# Patient Record
Sex: Female | Born: 1940 | Race: White | Hispanic: No | Smoking: Former smoker
Health system: Southern US, Community
[De-identification: ages and names within clinical notes are randomized; demographics above are authoritative.]

## PROBLEM LIST (undated history)

## (undated) DIAGNOSIS — T4145XA Adverse effect of unspecified anesthetic, initial encounter: Secondary | ICD-10-CM

## (undated) DIAGNOSIS — I10 Essential (primary) hypertension: Secondary | ICD-10-CM

## (undated) DIAGNOSIS — Z9889 Other specified postprocedural states: Secondary | ICD-10-CM

## (undated) DIAGNOSIS — G4700 Insomnia, unspecified: Secondary | ICD-10-CM

## (undated) DIAGNOSIS — F32A Depression, unspecified: Secondary | ICD-10-CM

## (undated) DIAGNOSIS — E042 Nontoxic multinodular goiter: Secondary | ICD-10-CM

## (undated) DIAGNOSIS — IMO0001 Reserved for inherently not codable concepts without codable children: Secondary | ICD-10-CM

## (undated) DIAGNOSIS — M797 Fibromyalgia: Secondary | ICD-10-CM

## (undated) DIAGNOSIS — T8859XA Other complications of anesthesia, initial encounter: Secondary | ICD-10-CM

## (undated) DIAGNOSIS — K219 Gastro-esophageal reflux disease without esophagitis: Secondary | ICD-10-CM

## (undated) DIAGNOSIS — G2581 Restless legs syndrome: Secondary | ICD-10-CM

## (undated) DIAGNOSIS — E785 Hyperlipidemia, unspecified: Secondary | ICD-10-CM

## (undated) DIAGNOSIS — R112 Nausea with vomiting, unspecified: Secondary | ICD-10-CM

## (undated) DIAGNOSIS — D249 Benign neoplasm of unspecified breast: Secondary | ICD-10-CM

## (undated) DIAGNOSIS — F329 Major depressive disorder, single episode, unspecified: Secondary | ICD-10-CM

## (undated) DIAGNOSIS — M1990 Unspecified osteoarthritis, unspecified site: Secondary | ICD-10-CM

## (undated) HISTORY — DX: Nontoxic multinodular goiter: E04.2

## (undated) HISTORY — DX: Fibromyalgia: M79.7

## (undated) HISTORY — DX: Unspecified osteoarthritis, unspecified site: M19.90

## (undated) HISTORY — DX: Reserved for inherently not codable concepts without codable children: IMO0001

## (undated) HISTORY — PX: MOUTH SURGERY: SHX715

## (undated) HISTORY — DX: Insomnia, unspecified: G47.00

## (undated) HISTORY — PX: ABDOMINAL HYSTERECTOMY: SHX81

## (undated) HISTORY — DX: Essential (primary) hypertension: I10

## (undated) HISTORY — DX: Hyperlipidemia, unspecified: E78.5

## (undated) HISTORY — DX: Benign neoplasm of unspecified breast: D24.9

## (undated) HISTORY — PX: COLONOSCOPY: SHX174

## (undated) HISTORY — DX: Restless legs syndrome: G25.81

## (undated) HISTORY — PX: BREAST SURGERY: SHX581

## (undated) HISTORY — PX: OOPHORECTOMY: SHX86

---

## 1970-08-15 HISTORY — PX: DILATION AND CURETTAGE OF UTERUS: SHX78

## 1998-03-06 ENCOUNTER — Other Ambulatory Visit: Admission: RE | Admit: 1998-03-06 | Discharge: 1998-03-06 | Payer: Self-pay | Admitting: Obstetrics and Gynecology

## 1999-03-08 ENCOUNTER — Other Ambulatory Visit: Admission: RE | Admit: 1999-03-08 | Discharge: 1999-03-08 | Payer: Self-pay | Admitting: Obstetrics and Gynecology

## 1999-12-06 ENCOUNTER — Other Ambulatory Visit: Admission: RE | Admit: 1999-12-06 | Discharge: 1999-12-06 | Payer: Self-pay | Admitting: Obstetrics and Gynecology

## 2000-05-15 ENCOUNTER — Other Ambulatory Visit: Admission: RE | Admit: 2000-05-15 | Discharge: 2000-05-15 | Payer: Self-pay | Admitting: Obstetrics and Gynecology

## 2001-06-14 ENCOUNTER — Other Ambulatory Visit: Admission: RE | Admit: 2001-06-14 | Discharge: 2001-06-14 | Payer: Self-pay | Admitting: *Deleted

## 2001-11-22 ENCOUNTER — Other Ambulatory Visit: Admission: RE | Admit: 2001-11-22 | Discharge: 2001-11-22 | Payer: Self-pay | Admitting: *Deleted

## 2002-06-26 ENCOUNTER — Other Ambulatory Visit: Admission: RE | Admit: 2002-06-26 | Discharge: 2002-06-26 | Payer: Self-pay | Admitting: *Deleted

## 2003-07-22 ENCOUNTER — Other Ambulatory Visit: Admission: RE | Admit: 2003-07-22 | Discharge: 2003-07-22 | Payer: Self-pay | Admitting: *Deleted

## 2004-01-30 ENCOUNTER — Encounter: Admission: RE | Admit: 2004-01-30 | Discharge: 2004-01-30 | Payer: Self-pay | Admitting: Rheumatology

## 2004-06-24 ENCOUNTER — Ambulatory Visit: Payer: Self-pay | Admitting: Internal Medicine

## 2004-09-06 ENCOUNTER — Ambulatory Visit: Payer: Self-pay | Admitting: Internal Medicine

## 2004-10-20 ENCOUNTER — Ambulatory Visit: Payer: Self-pay | Admitting: Internal Medicine

## 2004-11-15 ENCOUNTER — Ambulatory Visit: Payer: Self-pay | Admitting: Internal Medicine

## 2004-11-18 ENCOUNTER — Ambulatory Visit: Payer: Self-pay

## 2004-12-20 ENCOUNTER — Ambulatory Visit (HOSPITAL_COMMUNITY): Admission: RE | Admit: 2004-12-20 | Discharge: 2004-12-20 | Payer: Self-pay | Admitting: Internal Medicine

## 2005-06-28 ENCOUNTER — Ambulatory Visit: Payer: Self-pay | Admitting: Internal Medicine

## 2005-08-17 ENCOUNTER — Ambulatory Visit: Payer: Self-pay | Admitting: Internal Medicine

## 2005-08-25 ENCOUNTER — Ambulatory Visit: Payer: Self-pay | Admitting: Internal Medicine

## 2006-02-08 ENCOUNTER — Other Ambulatory Visit: Admission: RE | Admit: 2006-02-08 | Discharge: 2006-02-08 | Payer: Self-pay | Admitting: Obstetrics & Gynecology

## 2006-03-15 ENCOUNTER — Encounter (INDEPENDENT_AMBULATORY_CARE_PROVIDER_SITE_OTHER): Payer: Self-pay | Admitting: *Deleted

## 2006-03-15 LAB — CONVERTED CEMR LAB

## 2006-07-11 ENCOUNTER — Ambulatory Visit: Payer: Self-pay | Admitting: Internal Medicine

## 2006-08-16 ENCOUNTER — Ambulatory Visit: Payer: Self-pay | Admitting: Internal Medicine

## 2006-10-17 ENCOUNTER — Ambulatory Visit: Payer: Self-pay | Admitting: Internal Medicine

## 2006-10-17 LAB — CONVERTED CEMR LAB
Cholesterol: 209 mg/dL (ref 0–200)
Direct LDL: 133.3 mg/dL
HDL: 49.4 mg/dL (ref >39.0)
VLDL: 30 mg/dL (ref 0–40)

## 2006-10-19 ENCOUNTER — Ambulatory Visit: Payer: Self-pay | Admitting: Internal Medicine

## 2007-05-07 ENCOUNTER — Encounter: Payer: Self-pay | Admitting: *Deleted

## 2007-05-07 DIAGNOSIS — G2581 Restless legs syndrome: Secondary | ICD-10-CM

## 2007-05-07 DIAGNOSIS — IMO0001 Reserved for inherently not codable concepts without codable children: Secondary | ICD-10-CM

## 2007-05-07 DIAGNOSIS — M1990 Unspecified osteoarthritis, unspecified site: Secondary | ICD-10-CM | POA: Insufficient documentation

## 2007-05-07 HISTORY — DX: Reserved for inherently not codable concepts without codable children: IMO0001

## 2007-05-07 HISTORY — DX: Restless legs syndrome: G25.81

## 2007-05-07 HISTORY — DX: Unspecified osteoarthritis, unspecified site: M19.90

## 2007-06-01 ENCOUNTER — Other Ambulatory Visit: Admission: RE | Admit: 2007-06-01 | Discharge: 2007-06-01 | Payer: Self-pay | Admitting: Obstetrics & Gynecology

## 2007-06-25 ENCOUNTER — Telehealth: Payer: Self-pay | Admitting: Internal Medicine

## 2007-08-02 ENCOUNTER — Ambulatory Visit: Payer: Self-pay | Admitting: Internal Medicine

## 2007-12-13 ENCOUNTER — Ambulatory Visit: Payer: Self-pay | Admitting: Internal Medicine

## 2007-12-13 LAB — CONVERTED CEMR LAB
Basophils Absolute: 0.1 10*3/uL (ref 0.0–0.1)
Eosinophils Absolute: 0.3 10*3/uL (ref 0.0–0.7)
Eosinophils Relative: 3.6 % (ref 0.0–5.0)
HCT: 42 % (ref 36.0–46.0)
Hemoglobin: 14.2 g/dL (ref 12.0–15.0)
Neutro Abs: 4.3 10*3/uL (ref 1.4–7.7)
WBC: 8.4 10*3/uL (ref 4.5–10.5)

## 2008-01-25 ENCOUNTER — Telehealth: Payer: Self-pay | Admitting: Internal Medicine

## 2008-04-25 ENCOUNTER — Telehealth: Payer: Self-pay | Admitting: Internal Medicine

## 2008-05-21 ENCOUNTER — Encounter: Payer: Self-pay | Admitting: Internal Medicine

## 2008-06-24 ENCOUNTER — Ambulatory Visit: Payer: Self-pay | Admitting: Internal Medicine

## 2008-07-15 ENCOUNTER — Telehealth: Payer: Self-pay | Admitting: Internal Medicine

## 2008-07-17 ENCOUNTER — Ambulatory Visit: Payer: Self-pay | Admitting: Internal Medicine

## 2008-07-18 ENCOUNTER — Encounter: Payer: Self-pay | Admitting: Internal Medicine

## 2008-07-23 ENCOUNTER — Telehealth (INDEPENDENT_AMBULATORY_CARE_PROVIDER_SITE_OTHER): Payer: Self-pay | Admitting: *Deleted

## 2008-07-31 ENCOUNTER — Ambulatory Visit: Payer: Self-pay | Admitting: Internal Medicine

## 2008-07-31 DIAGNOSIS — E785 Hyperlipidemia, unspecified: Secondary | ICD-10-CM

## 2008-07-31 DIAGNOSIS — E1169 Type 2 diabetes mellitus with other specified complication: Secondary | ICD-10-CM

## 2008-07-31 HISTORY — DX: Hyperlipidemia, unspecified: E78.5

## 2008-08-01 DIAGNOSIS — I10 Essential (primary) hypertension: Secondary | ICD-10-CM

## 2008-08-01 DIAGNOSIS — E1165 Type 2 diabetes mellitus with hyperglycemia: Secondary | ICD-10-CM | POA: Insufficient documentation

## 2008-08-01 DIAGNOSIS — IMO0002 Reserved for concepts with insufficient information to code with codable children: Secondary | ICD-10-CM | POA: Insufficient documentation

## 2008-08-01 HISTORY — DX: Essential (primary) hypertension: I10

## 2008-08-28 ENCOUNTER — Ambulatory Visit: Payer: Self-pay | Admitting: Internal Medicine

## 2008-08-28 LAB — CONVERTED CEMR LAB
AST: 21 units/L (ref 0–37)
Albumin: 3.9 g/dL (ref 3.5–5.2)
BUN: 16 mg/dL (ref 6–23)
Bilirubin, Direct: 0.1 mg/dL (ref 0.0–0.3)
CO2: 31 meq/L (ref 19–32)
Calcium: 9.3 mg/dL (ref 8.4–10.5)
Chloride: 102 meq/L (ref 96–112)
Cholesterol: 138 mg/dL (ref 0–200)
Creatinine, Ser: 0.6 mg/dL (ref 0.4–1.2)
Creatinine,U: 124.2 mg/dL
GFR calc Af Amer: 129 mL/min
Microalb Creat Ratio: 161 mg/g — ABNORMAL HIGH (ref 0.0–30.0)
Microalb, Ur: 20 mg/dL — ABNORMAL HIGH (ref 0.0–1.9)
Potassium: 4.2 meq/L (ref 3.5–5.1)
Sodium: 139 meq/L (ref 135–145)
Total Protein: 7 g/dL (ref 6.0–8.3)
VLDL: 19 mg/dL (ref 0–40)

## 2008-09-02 ENCOUNTER — Ambulatory Visit: Payer: Self-pay | Admitting: Internal Medicine

## 2008-09-02 LAB — HM DIABETES FOOT EXAM

## 2008-10-03 ENCOUNTER — Telehealth: Payer: Self-pay | Admitting: Internal Medicine

## 2008-10-14 ENCOUNTER — Ambulatory Visit: Payer: Self-pay | Admitting: Internal Medicine

## 2008-10-14 LAB — CONVERTED CEMR LAB
ALT: 23 units/L (ref 0–35)
AST: 18 units/L (ref 0–37)
Alkaline Phosphatase: 69 units/L (ref 39–117)
Bilirubin Urine: NEGATIVE
Calcium: 9.3 mg/dL (ref 8.4–10.5)
Chloride: 101 meq/L (ref 96–112)
Cholesterol: 134 mg/dL (ref 0–200)
Crystals: NEGATIVE
GFR calc Af Amer: 129 mL/min
Glucose, Bld: 164 mg/dL — ABNORMAL HIGH (ref 70–99)
Hemoglobin, Urine: NEGATIVE
Ketones, ur: NEGATIVE mg/dL
Potassium: 4.3 meq/L (ref 3.5–5.1)
Sodium: 139 meq/L (ref 135–145)
Specific Gravity, Urine: 1.02 (ref 1.000–1.035)
Total CHOL/HDL Ratio: 3.1
Triglycerides: 66 mg/dL (ref 0–149)
Urine Glucose: NEGATIVE mg/dL
Urobilinogen, UA: 0.2 (ref 0.0–1.0)

## 2008-10-16 ENCOUNTER — Ambulatory Visit: Payer: Self-pay | Admitting: Internal Medicine

## 2008-11-25 ENCOUNTER — Ambulatory Visit: Payer: Self-pay | Admitting: Internal Medicine

## 2008-11-25 ENCOUNTER — Telehealth: Payer: Self-pay | Admitting: Internal Medicine

## 2008-11-25 LAB — CONVERTED CEMR LAB: Hgb A1c MFr Bld: 7.1 % — ABNORMAL HIGH (ref 4.6–6.5)

## 2008-11-27 ENCOUNTER — Ambulatory Visit: Payer: Self-pay | Admitting: Internal Medicine

## 2008-11-28 ENCOUNTER — Telehealth (INDEPENDENT_AMBULATORY_CARE_PROVIDER_SITE_OTHER): Payer: Self-pay | Admitting: *Deleted

## 2009-01-26 ENCOUNTER — Telehealth: Payer: Self-pay | Admitting: Internal Medicine

## 2009-01-27 ENCOUNTER — Ambulatory Visit: Payer: Self-pay | Admitting: Internal Medicine

## 2009-02-24 ENCOUNTER — Ambulatory Visit: Payer: Self-pay | Admitting: Internal Medicine

## 2009-02-24 DIAGNOSIS — T887XXA Unspecified adverse effect of drug or medicament, initial encounter: Secondary | ICD-10-CM

## 2009-02-24 LAB — CONVERTED CEMR LAB
AST: 22 units/L (ref 0–37)
Alkaline Phosphatase: 70 units/L (ref 39–117)
Bilirubin, Direct: 0.1 mg/dL (ref 0.0–0.3)
Cholesterol: 141 mg/dL (ref 0–200)
Eosinophils Relative: 5.8 % — ABNORMAL HIGH (ref 0.0–5.0)
HCT: 37.6 % (ref 36.0–46.0)
Hgb A1c MFr Bld: 7 % — ABNORMAL HIGH (ref 4.6–6.5)
LDL Cholesterol: 79 mg/dL (ref 0–99)
Lymphocytes Relative: 29.3 % (ref 12.0–46.0)
MCV: 89.7 fL (ref 78.0–100.0)
Monocytes Relative: 6.7 % (ref 3.0–12.0)
Neutrophils Relative %: 57.5 % (ref 43.0–77.0)
Platelets: 245 10*3/uL (ref 150.0–400.0)
RDW: 12.5 % (ref 11.5–14.6)
Total Protein: 6.7 g/dL (ref 6.0–8.3)
VLDL: 13 mg/dL (ref 0.0–40.0)

## 2009-02-26 ENCOUNTER — Ambulatory Visit: Payer: Self-pay | Admitting: Internal Medicine

## 2009-05-22 ENCOUNTER — Encounter: Payer: Self-pay | Admitting: Internal Medicine

## 2009-06-23 ENCOUNTER — Ambulatory Visit: Payer: Self-pay | Admitting: Internal Medicine

## 2009-06-23 LAB — CONVERTED CEMR LAB
Bilirubin, Direct: 0.1 mg/dL (ref 0.0–0.3)
Cholesterol: 153 mg/dL (ref 0–200)
HDL: 43 mg/dL (ref >39.00)
Hgb A1c MFr Bld: 7.5 % — ABNORMAL HIGH (ref 4.6–6.5)
LDL Cholesterol: 90 mg/dL (ref 0–99)
Total CHOL/HDL Ratio: 4
VLDL: 20.4 mg/dL (ref 0.0–40.0)

## 2009-06-26 ENCOUNTER — Ambulatory Visit: Payer: Self-pay | Admitting: Internal Medicine

## 2009-06-29 DIAGNOSIS — G4700 Insomnia, unspecified: Secondary | ICD-10-CM

## 2009-06-29 HISTORY — DX: Insomnia, unspecified: G47.00

## 2009-08-24 ENCOUNTER — Telehealth: Payer: Self-pay | Admitting: Internal Medicine

## 2009-09-22 ENCOUNTER — Ambulatory Visit: Payer: Self-pay | Admitting: Internal Medicine

## 2009-09-22 LAB — CONVERTED CEMR LAB
Alkaline Phosphatase: 76 units/L (ref 39–117)
Bilirubin, Direct: 0.1 mg/dL (ref 0.0–0.3)
Cholesterol: 144 mg/dL (ref 0–200)
HDL: 49.3 mg/dL (ref >39.00)
Hgb A1c MFr Bld: 7.4 % — ABNORMAL HIGH (ref 4.6–6.5)
LDL Cholesterol: 64 mg/dL (ref 0–99)
Total CHOL/HDL Ratio: 3
VLDL: 30.4 mg/dL (ref 0.0–40.0)

## 2009-09-25 ENCOUNTER — Ambulatory Visit: Payer: Self-pay | Admitting: Internal Medicine

## 2009-09-25 DIAGNOSIS — R197 Diarrhea, unspecified: Secondary | ICD-10-CM

## 2009-10-09 ENCOUNTER — Telehealth: Payer: Self-pay | Admitting: Internal Medicine

## 2009-10-20 ENCOUNTER — Telehealth: Payer: Self-pay | Admitting: Internal Medicine

## 2009-10-26 ENCOUNTER — Telehealth: Payer: Self-pay | Admitting: Internal Medicine

## 2009-10-28 ENCOUNTER — Ambulatory Visit: Payer: Self-pay | Admitting: Internal Medicine

## 2009-10-28 DIAGNOSIS — B373 Candidiasis of vulva and vagina: Secondary | ICD-10-CM | POA: Insufficient documentation

## 2009-10-30 ENCOUNTER — Encounter: Payer: Self-pay | Admitting: Internal Medicine

## 2009-10-30 ENCOUNTER — Ambulatory Visit: Payer: Self-pay | Admitting: Internal Medicine

## 2009-10-30 DIAGNOSIS — R300 Dysuria: Secondary | ICD-10-CM | POA: Insufficient documentation

## 2009-10-30 LAB — CONVERTED CEMR LAB
Bilirubin Urine: NEGATIVE
Hemoglobin, Urine: NEGATIVE
Ketones, ur: NEGATIVE mg/dL
Specific Gravity, Urine: >=1.03 (ref 1.000–1.030)
Total Protein, Urine: 30 mg/dL
pH: 6 (ref 5.0–8.0)

## 2009-11-30 ENCOUNTER — Telehealth: Payer: Self-pay | Admitting: Internal Medicine

## 2009-12-29 ENCOUNTER — Ambulatory Visit: Payer: Self-pay | Admitting: Internal Medicine

## 2010-01-27 ENCOUNTER — Telehealth: Payer: Self-pay | Admitting: Internal Medicine

## 2010-02-11 ENCOUNTER — Encounter: Payer: Self-pay | Admitting: Internal Medicine

## 2010-02-24 ENCOUNTER — Telehealth: Payer: Self-pay | Admitting: Internal Medicine

## 2010-03-03 ENCOUNTER — Encounter (INDEPENDENT_AMBULATORY_CARE_PROVIDER_SITE_OTHER): Payer: Self-pay | Admitting: *Deleted

## 2010-03-03 ENCOUNTER — Ambulatory Visit: Payer: Self-pay | Admitting: Internal Medicine

## 2010-03-12 ENCOUNTER — Encounter (INDEPENDENT_AMBULATORY_CARE_PROVIDER_SITE_OTHER): Payer: Self-pay | Admitting: *Deleted

## 2010-03-16 ENCOUNTER — Encounter (INDEPENDENT_AMBULATORY_CARE_PROVIDER_SITE_OTHER): Payer: Self-pay | Admitting: *Deleted

## 2010-03-16 ENCOUNTER — Ambulatory Visit: Payer: Self-pay | Admitting: Internal Medicine

## 2010-03-23 ENCOUNTER — Telehealth: Payer: Self-pay | Admitting: Internal Medicine

## 2010-03-30 ENCOUNTER — Ambulatory Visit: Payer: Self-pay | Admitting: Internal Medicine

## 2010-04-01 ENCOUNTER — Encounter: Payer: Self-pay | Admitting: Internal Medicine

## 2010-04-29 ENCOUNTER — Telehealth: Payer: Self-pay | Admitting: Internal Medicine

## 2010-05-06 ENCOUNTER — Ambulatory Visit: Payer: Self-pay | Admitting: Internal Medicine

## 2010-05-19 ENCOUNTER — Telehealth: Payer: Self-pay | Admitting: Internal Medicine

## 2010-05-20 ENCOUNTER — Telehealth: Payer: Self-pay | Admitting: Internal Medicine

## 2010-05-26 ENCOUNTER — Encounter: Payer: Self-pay | Admitting: Internal Medicine

## 2010-06-17 ENCOUNTER — Encounter: Payer: Self-pay | Admitting: Internal Medicine

## 2010-06-23 ENCOUNTER — Telehealth: Payer: Self-pay | Admitting: Internal Medicine

## 2010-07-01 ENCOUNTER — Ambulatory Visit: Payer: Self-pay | Admitting: Endocrinology

## 2010-07-01 DIAGNOSIS — E042 Nontoxic multinodular goiter: Secondary | ICD-10-CM | POA: Insufficient documentation

## 2010-07-01 HISTORY — DX: Nontoxic multinodular goiter: E04.2

## 2010-07-01 LAB — CONVERTED CEMR LAB
Hgb A1c MFr Bld: 7.5 % — ABNORMAL HIGH (ref 4.6–6.5)
TSH: 1.62 microintl units/mL (ref 0.35–5.50)

## 2010-07-29 ENCOUNTER — Ambulatory Visit: Payer: Self-pay | Admitting: Endocrinology

## 2010-07-29 ENCOUNTER — Encounter: Payer: Self-pay | Admitting: Endocrinology

## 2010-07-29 LAB — CONVERTED CEMR LAB: Fructosamine: 225 micromoles/L (ref <285)

## 2010-09-14 NOTE — Progress Notes (Signed)
Summary: Metformin  Phone Note Call from Patient Call back at Work Phone [phone]   Summary of Call: Pt stopped metformin as directed and feels much better. Nausea & diarrhea have resolved. She wants to know what the plan is now for diabetes management.  Initial call taken by: Lamar Sprinkles, CMA,  October 09, 2009 9:48 AM  Follow-up for Phone Call        cahgne to actos 30mg  once daily, #30, refill 12 eScribed to drugstore. Follow-up by: Jacques Navy MD,  October 09, 2009 2:33 PM  Additional Follow-up for Phone Call Additional follow up Details #1::        Pt informed  Additional Follow-up by: Lamar Sprinkles, CMA,  October 09, 2009 6:24 PM    New/Updated Medications: ACTOS 30 MG TABS (PIOGLITAZONE HCL) 1 by mouth once daily Prescriptions: ACTOS 30 MG TABS (PIOGLITAZONE HCL) 1 by mouth once daily  #30 x 12   Entered and Authorized by:   Jacques Navy MD   Signed by:   Jacques Navy MD on 10/09/2009   Method used:   [facility]  [address] [phone][facility] ([facility]ail[facility]       [address]       [place], [place]  [address]       Ph: [phone] or [phone]       Fax: [phone]   RxID:   [id number]

## 2010-09-14 NOTE — Letter (Signed)
Summary: Previsit letter  [facility]  [address] [place], [place] [address]   Phone: [phone]  Fax: [phone]       03/03/2010 MRN: [id number]  Carrie White [address] [place], [place]  [address]  Dear Ms. [facility],  Welcome to the Gastroenterology Division at [facility].    You are scheduled to see a nurse for your pre-procedure visit on 03-16-10 at 10am on the 3rd floor at [facility], [address]  We ask that you try to arrive at our office 15 minutes prior to your appointment time to allow for check-in.  Your nurse visit will consist of discussing your medical and surgical history, your immediate family medical history, and your medications.    Please bring a complete list of all your medications or, if you prefer, bring the medication bottles and we will list them.  We will need to be aware of both prescribed and over the counter drugs.  We will need to know exact dosage information as well.  If you are on blood thinners (Coumadin, Plavix, Aggrenox, Ticlid, etc.) please call our office today/prior to your appointment, as we need to consult with your physician about holding your medication.   Please be prepared to read and sign documents such as consent forms, a financial agreement, and acknowledgement forms.  If necessary, and with your consent, a friend or relative is welcome to sit-in on the nurse visit with you.  Please bring your insurance card so that we may make a copy of it.  If your insurance requires a referral to see a specialist, please bring your referral form from your primary care physician.  No co-pay is required for this nurse visit.     If you cannot keep your appointment, please call [phone] to cancel or reschedule prior to your appointment date.  This allows [place] the opportunity to schedule an appointment for another patient in need of care.    Thank you for choosing [facility] for your medical needs.   We appreciate the opportunity to care for you.  Please visit [place] at our website  to learn more about our practice.                     Sincerely.                                                                                                                   The Gastroenterology Division

## 2010-09-14 NOTE — Progress Notes (Signed)
Summary: DIABETES  Phone Note Call from Patient Call back at Work Phone [phone] Call back at [phone]   Summary of Call: Patient is requesting a call back. Has questions about her diabetes.  Initial call taken by: Lamar Sprinkles, CMA,  April 29, 2010 11:18 AM  Follow-up for Phone Call        Pt started new meds for diabetes. She is concerned that this has not caused her cbg's to become any lower. Pt will fax readings to my attention.  Follow-up by: Lamar Sprinkles, CMA,  April 29, 2010 4:43 PM  Additional Follow-up for Phone Call Additional follow up Details #1::        Results avail for MD to review..............Marland KitchenLamar Sprinkles, CMA  April 29, 2010 5:02 PM     Additional Follow-up for Phone Call Additional follow up Details #2::    tried to call at [employer] is busy. She needs an ov to discuss modifications in her diabetes regimen to get better control.  Follow-up by: Jacques Navy MD,  May 05, 2010 1:38 PM  Additional Follow-up for Phone Call Additional follow up Details #3:: Details for Additional Follow-up Action Taken: Pt informed, transferred to scheduler for office visit. Additional Follow-up by: Lamar Sprinkles, CMA,  May 05, 2010 4:37 PM

## 2010-09-14 NOTE — Progress Notes (Signed)
Summary: diabetes  Phone Note Other Incoming   Summary of Call: Pt called stating that the increase in her actos (from 30 to 45mg ) has not even touched her CBGs. She states that her blood sugars are running in the high 300's. Should she come in for an appt. and should it be with Dr Everardo All Please advise Initial call taken by: Ami Bullins CMA,  October 26, 2009 10:45 AM  Follow-up for Phone Call        I can start metformin or gliimeparide or januvia as a second med, but I would be reluctant to do this since no kidney function labs have been done here since early 2010;   please check BMET:  250.02, so that a second med for DM can be considered Follow-up by: Corwin Levins MD,  October 26, 2009 12:57 PM  Additional Follow-up for Phone Call Additional follow up Details #1::        spoke with pt's husband who advised me to call her at work. I tried her work number and it was busy, will try back shortly. Additional Follow-up by: Ami Bullins CMA,  October 26, 2009 1:58 PM    Additional Follow-up for Phone Call Additional follow up Details #2::    Patient left message on triage for me to call her at work # [phone]. Phone system will not let me call to this number. Will try again later. Lucious Groves,  October 27, 2009 11:39 AM  Patient called triage today, requesting we call her office, this number is not reachable. I tried again x3 and still receive busy signal.  Patient spouse notified, and he requests that we call her cell # [phone]. I spoke with patient and her sugar was 409, but has gone down to 390. Patient aware that she needs office visit today with labs and transferred to schedule.Lucious Groves  October 28, 2009 9:37 AM **Seeing Felicity Coyer this afternoon.  Additional Follow-up for Phone Call Additional follow up Details #3:: Details for Additional Follow-up Action Taken: k Additional Follow-up by: Jacques Navy MD,  October 28, 2009 10:17 AM

## 2010-09-14 NOTE — Progress Notes (Signed)
Summary: Colon Prep concerns  Phone Note Call from Patient   Summary of Call: Patient is concerned about colon prep and her diabetes. She was told to drink, "everything w/sugar in it".  Would you like to advise? Or refer to GI RN's for further concerns?  Initial call taken by: Lamar Sprinkles, CMA,  March 23, 2010 11:01 AM  Follow-up for Phone Call        refer to GI nursing who handles bowel prep instructions, including for diabetic patients. Thanks Follow-up by: Jacques Navy MD,  March 23, 2010 1:09 PM  Additional Follow-up for Phone Call Additional follow up Details #1::        Spoke with pt's husband and reviewed instructions.  Pt is to take her oral diabetic medications the day before as prescribed and to hold them the day of her procedure before coming into office.  Pt is to drink regular drinks and jello, not sugar free or diet due to taking her meds and not taking in food; in order to keep her blood sugar up.  Understanding voiced and no further questions at this time Additional Follow-up by: Karl Bales RN,  March 23, 2010 2:00 PM

## 2010-09-14 NOTE — Letter (Signed)
Summary: Cbg log/Patient  Cbg log/Patient   Imported By: Sherian Rein 05/25/2010 15:09:27  _____________________________________________________________________  External Attachment:    Type:   Image     Comment:   External Document

## 2010-09-14 NOTE — Progress Notes (Signed)
Summary: CBGs  Phone Note Call from Patient Call back at Work Phone [phone] Call back at [phone]   Summary of Call: Patient is requesting a call regarding cbg log she faxed to MD tuesday. Dr Debby Bud, have you seen this? She is concerned b/c the cbg's keep going up and down.  Initial call taken by: Lamar Sprinkles, CMA,  May 20, 2010 11:09 AM  Follow-up for Phone Call        called patinet 10/6 @ 1900hrs. She is concerned about variable CBGs.She is taking victoza 1.2mg  daily. Discussed use and variability. Advised that she can pick up another sample cartridge and needles. If she is not satisfied with victoza she will schedule an appointment to discuss other treatment options.  Follow-up by: Jacques Navy MD,  May 21, 2010 4:03 AM

## 2010-09-14 NOTE — Letter (Signed)
Summary: [facility] Instructions  [facility]  [address] [place], [place] [address]   Phone: [phone]  Fax: [phone]       Carrie White    12-Mar-1941    MRN: [id number]       Procedure Day Dorna Bloom:  Jake Shark  03/30/10     Arrival Time:  7:30AM     Procedure Time:  8:30AM     Location of Procedure:                    _ X_  [facility] (4th Floor)    PREPARATION FOR COLONOSCOPY WITH MIRALAX  Starting 5 days prior to your procedure 03/25/10 do not eat nuts, seeds, popcorn, corn, beans, peas,  salads, or any raw vegetables.  Do not take any fiber supplements (e.g. Metamucil, Citrucel, and Benefiber). ____________________________________________________________________________________________________   THE DAY BEFORE YOUR PROCEDURE         DATE: 03/29/10  DAY: MONDAY  1   Drink clear liquids the entire day-NO SOLID FOOD  2   Do not drink anything colored red or purple.  Avoid juices with pulp.  No orange juice.  3   Drink at least 64 oz. (8 glasses) of fluid/clear liquids during the day to prevent dehydration and help the prep work efficiently.  CLEAR LIQUIDS INCLUDE: Water Jello Ice Popsicles Tea (sugar ok, no milk/cream) Powdered fruit flavored drinks Coffee (sugar ok, no milk/cream) Gatorade Juice: apple, white grape, white cranberry  Lemonade Clear bullion, consomm, broth Carbonated beverages (any kind) Strained chicken noodle soup Hard Candy  4   Mix the entire bottle of Miralax with 64 oz. of Gatorade/Powerade in the morning and put in the refrigerator to chill.  5   At 3:00 pm take 2 Dulcolax/Bisacodyl tablets.  6   At 4:30 pm take one Reglan/Metoclopramide tablet.  7  Starting at 5:00 pm drink one 8 oz glass of the Miralax mixture every 15-20 minutes until you have finished drinking the entire 64 oz.  You should finish drinking prep around 7:30 or 8:00 pm.  8   If you are nauseated, you may take the 2nd Reglan/Metoclopramide  tablet at 6:30 pm.        9    At 8:00 pm take 2 more DULCOLAX/Bisacodyl tablets.     THE DAY OF YOUR PROCEDURE      DATE:  03/30/10  DAY: Jake Shark  You may drink clear liquids until 6:30AM  (2 HOURS BEFORE PROCEDURE).   MEDICATION INSTRUCTIONS  Unless otherwise instructed, you should take regular prescription medications with a small sip of water as early as possible the morning of your procedure.  Diabetic patients - see separate instructions.         OTHER INSTRUCTIONS  You will need a responsible adult at least 69 years of age to accompany you and drive you home.   This person must remain in the waiting room during your procedure.  Wear loose fitting clothing that is easily removed.  Leave jewelry and other valuables at home.  However, you may wish to bring a book to read or an iPod/MP3 player to listen to music as you wait for your procedure to start.  Remove all body piercing jewelry and leave at home.  Total time from sign-in until discharge is approximately 2-3 hours.  You should go home directly after your procedure and rest.  You can resume normal activities the day after your procedure.  The day of your  procedure you should not:   Drive   Make legal decisions   Operate machinery   Drink alcohol   Return to work  You will receive specific instructions about eating, activities and medications before you leave.   The above instructions have been reviewed and explained to me by   Karl Bales RN  March 16, 2010 10:38 A.M.    I fully understand and can verbalize these instructions _____________________________ Date _______

## 2010-09-14 NOTE — Progress Notes (Signed)
  Phone Note Other Incoming   Caller: pt Summary of Call: Pt called and states the actos is not helping with her CBGs, she states that her CBGs are running in the high 100's to mid 200's. What do you advise Initial call taken by: Ami Bullins CMA,  October 20, 2009 1:36 PM  Follow-up for Phone Call        ok to change the actos to 45 mg per day - done escript;  can also take 1 and 1/2 of the 30 mg pills to use them up;  cont to monitor cbg's as she does, watch for any swelling of the feet or low sugar symptoms;  and ROV with Dr Debby Bud in 6 wks to re-assess Follow-up by: Corwin Levins MD,  October 20, 2009 4:53 PM  Additional Follow-up for Phone Call Additional follow up Details #1::        informed pt's husband Additional Follow-up by: Ami Bullins CMA,  October 21, 2009 10:14 AM    New/Updated Medications: ACTOS 45 MG TABS (PIOGLITAZONE HCL) 1po once daily Prescriptions: ACTOS 45 MG TABS (PIOGLITAZONE HCL) 1po once daily  #30 x 11   Entered and Authorized by:   Corwin Levins MD   Signed by:   Corwin Levins MD on 10/20/2009   Method used:   [facility]  [address] [phone][facility] ([facility]ail[facility]       [address]       [place], [place]  [address]       Ph: [phone] or [phone]       Fax: [phone]   RxID:   [id number]

## 2010-09-14 NOTE — Letter (Signed)
Summary: [facility] Referral (unable to schedule) Notification  [facility]  [address] [place], [place] [address]   Phone: [phone]  Fax: [phone]      February 11, 2010 GIAVANNI ZEITLIN 1940-12-05 MRN: [id number]   DELANIA FERG [address] [place], [place]  [address]   Dear Dr. Debby Bud:   Thank you for your kind referral of the above patient. We have attempted to schedule the recommended Colonoscopy but have been unable to schedule because:  _x_ The patient was not available by phone and/or has not returned our calls.  __ The patient declined to schedule the procedure at this time.  We appreciate the referral and hope that we will have the opportunity to treat this patient in the future.    Sincerely,   [facility]  Vania Rea. Jarold Motto M.D. Hedwig Morton. Juanda Chance M.D. Venita Lick. Russella Dar M.D. Wilhemina Bonito. Marina Goodell M.D. Barbette Hair. Arlyce Dice M.D. Iva Boop M.D. Cheron Every.D.

## 2010-09-14 NOTE — Letter (Signed)
Summary: Diabetic Instructions  [facility]  [address]   [place], [place] [address]   Phone: [phone]  Fax: [phone]    Carrie White 1941-06-26 MRN: [id number]   x    ORAL DIABETIC MEDICATION INSTRUCTIONS  The day before your procedure:   Take your diabetic pill as you do normally  The day of your procedure:   Do not take your diabetic pill    We will check your blood sugar levels during the admission process and again in Recovery before discharging you home  ________________________________________________________________________

## 2010-09-14 NOTE — Progress Notes (Signed)
Summary: JURY NOTE  Phone Note Call from Patient   Summary of Call: Pt has a juror summons. She wants note from MD to be excused from [employer].  Initial call taken by: Lamar Sprinkles, CMA,  November 30, 2009 4:10 PM  Follow-up for Phone Call        on what grounds. She is medically stable and should be able to serve jury duty Follow-up by: Jacques Navy MD,  November 30, 2009 5:37 PM  Additional Follow-up for Phone Call Additional follow up Details #1::        Per pt, she is diabetic and "never knows" when her sugar could drop too low. She would become sick and "out of it" Additional Follow-up by: Lamar Sprinkles, CMA,  November 30, 2009 5:38 PM    Additional Follow-up for Phone Call Additional follow up Details #2::    She can take a sweet snack with her if she starts to feel her sugar is low. We all have a duty to serve.  Follow-up by: Jacques Navy MD,  December 01, 2009 9:12 AM  Additional Follow-up for Phone Call Additional follow up Details #3:: Details for Additional Follow-up Action Taken: advised pt Additional Follow-up by: Ami Bullins CMA,  December 01, 2009 2:29 PM

## 2010-09-14 NOTE — Assessment & Plan Note (Signed)
Summary: 2 MTH FU  STC   Vital Signs:  Patient profile:   70 year old female Height:      65 inches Weight:      201 pounds BMI:     33.57 O2 Sat:      96 % on Room air Temp:     97.1 degrees F oral Pulse rate:   76 / minute BP sitting:   124 / 82  (left arm) Cuff size:   regular  Vitals Entered By: Bill Salinas CMA (Dec 29, 2009 11:13 AM)  O2 Flow:  Room air CC: follow-up visit/ ab   Primary Care Provider:  [name]  CC:  follow-up visit/ ab.  History of Present Illness: Patient returns for follow -up: she reports that her daytime CBGs are good but she has had an increase in appetite and 10lb weight gain. She has had resolution of diarrhea having stopped metformin. She reports that she is having episodes of low blood sugar.   Current Medications (verified): 1)  Ropinirole Hcl 3 Mg Tabs (Ropinirole Hcl) .Marland Kitchen.. 1 By Mouth At Bedtime 2)  Prilosec 40 Mg  Cpdr (Omeprazole) .... Once Daily 3)  Tylenol/codeine #3 300-30 Mg  Tabs (Acetaminophen-Codeine) .... Every Am 4)  Ambien 10 Mg  Tabs (Zolpidem Tartrate) .... As Needed 5)  Simvastatin 40 Mg Tabs (Simvastatin) .Marland Kitchen.. 1 in Pm For Cholesterol 6)  Lisinopril 10 Mg Tabs (Lisinopril) .Marland Kitchen.. 1 By Mouth Once Daily For Blood Pressure and Renal Protection. 7)  Bayer Breeze 2 Test  Disk (Glucose Blood) .... Test Two Times A Day As Needed Dx 250.02 8)  Actos 45 Mg Tabs (Pioglitazone Hcl) .... Take 1 By Mouth Once Daily 9)  Glimepiride 4 Mg Tabs (Glimepiride) .Marland Kitchen.. 1 By Mouth Two Times A Day 10)  Miconazole 3 200-2 Mg-% (9gm) Kit (Miconazole Nitrate) .... Use As Directed For Yeast  Allergies (verified): No Known Drug Allergies PMH-FH-SH reviewed-no changes except otherwise noted  Review of Systems       The patient complains of weight gain.  The patient denies anorexia, fever, chest pain, dyspnea on exertion, prolonged cough, and abdominal pain.         she has gained 10+ pounds  Physical Exam  General:  overweight white female in no  distress Neck:  supple.   Lungs:  normal respiratory effort and no wheezes.   Heart:  normal rate and regular rhythm.   Abdomen:  moderate obesity Msk:  no joint tenderness and no joint deformities.   Pulses:  2+ radial Neurologic:  alert & oriented X3, cranial nerves II-XII intact, and gait normal.   Skin:  turgor normal and color normal.   Psych:  Oriented X3, memory intact for recent and remote, and normally interactive.     Impression & Recommendations:  Problem # 1:  DIABETES MELLITUS, TYPE II, UNCONTROLLED (ICD-250.02) Patient has had good control but she is troubled by weight gain and hypoglycemic episodes.  Plan- d/c actos          reduce glimepiride to 4mg  once daily          start Januvia 100mg  once daily.   Her updated medication list for this problem includes:    Lisinopril 10 Mg Tabs (Lisinopril) .Marland Kitchen... 1 by mouth once daily for blood pressure and renal protection.    Januvia 100 Mg Tabs (Sitagliptin phosphate) .Marland Kitchen... 1 by mouth once daily    Glimepiride 4 Mg Tabs (Glimepiride) .Marland Kitchen... 1 by mouth once a day  Complete Medication List: 1)  Ropinirole Hcl 3 Mg Tabs (Ropinirole hcl) .Marland Kitchen.. 1 by mouth at bedtime 2)  Prilosec 40 Mg Cpdr (Omeprazole) .... Once daily 3)  Tylenol/codeine #3 300-30 Mg Tabs (Acetaminophen-codeine) .... Every am 4)  Ambien 10 Mg Tabs (Zolpidem tartrate) .... As needed 5)  Simvastatin 40 Mg Tabs (Simvastatin) .Marland Kitchen.. 1 in pm for cholesterol 6)  Lisinopril 10 Mg Tabs (Lisinopril) .Marland Kitchen.. 1 by mouth once daily for blood pressure and renal protection. 7)  Bayer Breeze 2 Test Disk (Glucose blood) .... Test two times a day as needed dx 250.02 8)  Januvia 100 Mg Tabs (Sitagliptin phosphate) .Marland Kitchen.. 1 by mouth once daily 9)  Glimepiride 4 Mg Tabs (Glimepiride) .Marland Kitchen.. 1 by mouth once a day 10)  Miconazole 3 200-2 Mg-% (9gm) Kit (Miconazole nitrate) .... Use as directed for yeast  Patient Instructions: 1)  Diabetes- the problem is a) weight gain, most likely due to  actos, b) low blood sugar episodes. Plan - finish your supply of actos and then start Januvia 100mg  daily; reduce glimeperide to once a day. 2)  Blood pressure is excellent. 3)  Cholesterol levels in February were excellent. Continue on your present medication.

## 2010-09-14 NOTE — Progress Notes (Signed)
       New/Updated Medications: BD PEN NEEDLE ULTRAFINE 29G X 12.7MM MISC (INSULIN PEN NEEDLE) 1 needle once daily Prescriptions: BD PEN NEEDLE ULTRAFINE 29G X 12.7MM MISC (INSULIN PEN NEEDLE) 1 needle once daily  #30 x 3   Entered by:   Alysia Penna   Authorized by:   Jacques Navy MD   Signed by:   Alysia Penna on 05/19/2010   Method used:   [facility]        [facility]  [address] [phone]* (retail)       [address]       [place], [place]  [address]       Ph: [phone] or [phone]       Fax: [phone]   RxID:   [id number]

## 2010-09-14 NOTE — Progress Notes (Signed)
Summary: Carrie White update  Phone Note Call from Patient Call back at Work Phone [phone] Call back at [phone] cell   Caller: Patient Summary of Call: Patient stating that she was told to call and give an update since starting Januvia. Per pt she has some daily diahrrea but not as bad as when taking metformin. She has also seen no chage and still has an appetite. She was given 2wk samples of medication and would like to know is MD wants to refill or change. Initial call taken by: Rock Nephew CMA,  January 27, 2010 11:21 AM  Follow-up for Phone Call        what are her blood sugars, if she checks? Carrie White should not be a cause of diarrhea. I recommend continuing - Rx: januvia 100mg , sig 1 once daily, # 30 , refill prn Follow-up by: Jacques Navy MD,  January 27, 2010 2:54 PM  Additional Follow-up for Phone Call Additional follow up Details #1::        Pt's cbgs have been 134, 117, 120, 100, 89. These are before dinner after her 45 min walk. She c/o soft stools and rectal irritation with frequent wiping. I suggested she try wet wipes to help w/irritation. Gave pt samples of januvia and she will call back w/further updates or concerns.  Additional Follow-up by: Lamar Sprinkles, CMA,  January 28, 2010 9:11 AM

## 2010-09-14 NOTE — Assessment & Plan Note (Signed)
Summary: discuss diabetes/SD   Vital Signs:  Patient profile:   70 year old female Height:      65 inches Weight:      207.50 pounds BMI:     34.65 O2 Sat:      95 % on Room air Temp:     98.0 degrees F oral Pulse rate:   82 / minute Pulse rhythm:   regular BP sitting:   116 / 68  (left arm) Cuff size:   large  Vitals Entered By: Rock Nephew CMA (May 06, 2010 3:38 PM)  O2 Flow:  Room air CC: Pt would like to discuss getting CBG regulated Is Patient Diabetic? Yes Did you bring your meter with you today? No Pain Assessment Patient in pain? no       Does patient need assistance? Functional Status Self care Ambulation Normal   Primary Care Provider:  Johanna Matto  CC:  Pt would like to discuss getting CBG regulated.  History of Present Illness: blood sugars running high. She has failed metformin - diarrhea, actos - weight gain. Her CBGs have been high. Her last A1C in Feb '11 was 7.4%.  Plan - will d/c/ glimeperide, continue junuvia 100mg  once daily and add victoza 1.2mg  subcutaneously dialy.   Allergies (verified): No Known Drug Allergies  Comments:  Nurse/Medical Assistant: Per pt, she no longer takes Miconazole Rock Nephew CMA (May 06, 2010 3:39 PM)  Past History:  Past Medical History: Last updated: 10/28/2009 HYPERTENSION  DIABETES MELLITUS, TYPE II, UNCONTROLLED  HYPERLIPIDEMIA OSTEOARTHRITIS  FIBROMYALGIA  RESTLESS LEG SYNDROME   Past Surgical History: Last updated: 05/07/2007 Hysterectomy Oophorectomy PSH reviewed for relevance, FH reviewed for relevance  Review of Systems       The patient complains of weight loss.  The patient denies anorexia, fever, chest pain, dyspnea on exertion, peripheral edema, abdominal pain, hematuria, muscle weakness, difficulty walking, and unusual weight change.    Physical Exam  General:  overweight white female in no distress Head:  normocephalic and atraumatic.   Eyes:  C&S clear Lungs:   normal respiratory effort and normal breath sounds.   Heart:  normal rate and regular rhythm.   Neurologic:  alert & oriented X3, cranial nerves II-XII intact, and gait normal.   Skin:  turgor normal and color normal.   Psych:  normally interactive and good eye contact.     Impression & Recommendations:  Problem # 1:  DIABETES MELLITUS, TYPE II, UNCONTROLLED (ICD-250.02)  The following medications were removed from the medication list:    Glimepiride 4 Mg Tabs (Glimepiride) .Marland Kitchen... 1 by mouth once a day Her updated medication list for this problem includes:    Lisinopril 10 Mg Tabs (Lisinopril) .Marland Kitchen... 1 by mouth once daily for blood pressure and renal protection.    Januvia 100 Mg Tabs (Sitagliptin phosphate) .Marland Kitchen... 1 by mouth once daily    Victoza 18 Mg/70ml Soln (Liraglutide) .Marland Kitchen... 0.6 mg subcutaneously once daily x 7 then 1.2 mg subcutaneously once daily.  Labs Reviewed: Creat: 0.6 (10/14/2008)    Reviewed HgBA1c results: 7.4 (09/22/2009)  7.5 (06/23/2009)  Patient with poor CBGs. Plan is to stop sulfonylurea and start GLP-1, cont't DPP-4. ROV in 1-2 weeks.  CMA instructed patient in use of victoza pen.   Complete Medication List: 1)  Ropinirole Hcl 3 Mg Tabs (Ropinirole hcl) .Marland Kitchen.. 1 by mouth at bedtime 2)  Prilosec 40 Mg Cpdr (Omeprazole) .... Once daily 3)  Tylenol/codeine #3 300-30 Mg Tabs (Acetaminophen-codeine) .... Every am 4)  Ambien 10 Mg Tabs (Zolpidem tartrate) .... As needed 5)  Simvastatin 40 Mg Tabs (Simvastatin) .Marland Kitchen.. 1 in pm for cholesterol 6)  Lisinopril 10 Mg Tabs (Lisinopril) .Marland Kitchen.. 1 by mouth once daily for blood pressure and renal protection. 7)  Bayer Breeze 2 Test Disk (Glucose blood) .... Test two times a day as needed dx 250.02 8)  Januvia 100 Mg Tabs (Sitagliptin phosphate) .Marland Kitchen.. 1 by mouth once daily 9)  Miconazole 3 200-2 Mg-% (9gm) Kit (Miconazole nitrate) .... Use as directed for yeast 10)  Victoza 18 Mg/73ml Soln (Liraglutide) .... 0.6 mg subcutaneously once  daily x 7 then 1.2 mg subcutaneously once daily.  Other Orders: Flu Vaccine 51yrs + MEDICARE PATIENTS (V8938) Administration Flu vaccine - MCR (B0175) Flu Vaccine Consent Questions     Do you have a history of severe allergic reactions to this vaccine? no    Any prior history of allergic reactions to egg and/or gelatin? no    Do you have a sensitivity to the preservative Thimersol? no    Do you have a past history of Guillan-Barre Syndrome? no    Do you currently have an acute febrile illness? no    Have you ever had a severe reaction to latex? no    Vaccine information given and explained to patient? yes    Are you currently pregnant? no    Lot Number:[id number]   Exp Date:02/12/2011   Site Given  Left Deltoid IMion Flu vaccine - MCR (Z0258)  Patient Instructions: 1)  Diabetes - for better control will stop glimeperide, continue Januvia. Will add injectable victoza starting at 0.6mg  daily x 1 week then increasing to 1.2 mg daily. You may continue to check blood sugars twice a day: morning and then alternate the 2nd test to mid-day, evening or bedtime. Watch for low blood sugar. Continue on a health diabetic diet.   Marland Kitchenlbmedflu

## 2010-09-14 NOTE — Letter (Signed)
Summary: CBG log/Patient  CBG log/Patient   Imported By: Sherian Rein 06/23/2010 10:56:49  _____________________________________________________________________  External Attachment:    Type:   Image     Comment:   External Document

## 2010-09-14 NOTE — Progress Notes (Signed)
  Phone Note Refill Request Message from:  Fax from Pharmacy on August 24, 2009 9:48 AM  Refills Requested: Medication #1:  SIMVASTATIN 40 MG TABS 1 in PM for cholesterol Initial call taken by: Ami Bullins CMA,  August 24, 2009 9:48 AM    Prescriptions: SIMVASTATIN 40 MG TABS (SIMVASTATIN) 1 in PM for cholesterol  #30 x 12   Entered by:   Ami Bullins CMA   Authorized by:   Jacques Navy MD   Signed by:   Bill Salinas CMA on 08/24/2009   Method used:   [facility]ron[facility] to        [facility] [phone][facility] (retail)       [address]       [place], [place]  [address]       Ph: [phone] or [phone]       Fax: [phone]   RxID:   [id number]

## 2010-09-14 NOTE — Miscellaneous (Signed)
Summary: [facility] previsit  Clinical Lists Changes  Medications: Added new medication of DULCOLAX 5 MG  TBEC (BISACODYL) Day before procedure take 2 at 3pm and 2 at 8pm. - Signed Added new medication of METOCLOPRAMIDE HCL 10 MG  TABS (METOCLOPRAMIDE HCL) As per prep instructions. - Signed Added new medication of MIRALAX   POWD (POLYETHYLENE GLYCOL 3350) As per prep  instructions. - Signed Rx of DULCOLAX 5 MG  TBEC (BISACODYL) Day before procedure take 2 at 3pm and 2 at 8pm.;  #4 x 0;  Signed;  Entered by: Karl Bales RN;  Authorized by: Hart Carwin MD;  Method used: [facility]ically [facility] [phone]*, [address] [place], [place], [place]  [address], Ph: [phone] or [phone], Fax: [phone] Rx of METOCLOPRAMIDE HCL 10 MG  TABS (METOCLOPRAMIDE HCL) As per prep instructions.;  #2 x 0;  Signed;  Entered by: Karl Bales RN;  Authorized by: Hart Carwin MD;  Method used: [facility]ically [facility] [phone]*, [address] [place], [place], [place]  [address], Ph: [phone] or [phone], Fax: [phone] Rx of MIRALAX   POWD (POLYETHYLENE GLYCOL 3350) As per prep  instructions.;  #255gm x 0;  Signed;  Entered by: Karl Bales RN;  Authorized by: Hart Carwin MD;  Method used: [facility]ically [facility] [phone]*, [address] [place], [place], [place]  [address], Ph: [phone] or [phone], Fax: [phone]    Prescriptions: MIRALAX   POWD (POLYETHYLENE GLYCOL 3350) As per prep  instructions.  #255gm x 0   Entered by:   Karl Bales RN   Authorized by:   Hart Carwin MD   Signed by:   Karl Bales RN on 03/16/2010   Method used:   [facility]ronically [facility]        [facility] [phone]* (retail)       [address]       [place], [place]  [address]       Ph: [phone] or [phone]       Fax: [phone]   RxID:   [id number] METOCLOPRAMIDE HCL 10 MG  TABS (METOCLOPRAMIDE HCL) As  per prep instructions.  #2 x 0   Entered by:   Karl Bales RN   Authorized by:   Hart Carwin MD   Signed by:   Karl Bales RN on 03/16/2010   Method used:   [facility]        [facility] [phone]* (retail)       [address]       [place], [place]  [address]       Ph: [phone] or [phone]       Fax: [phone]   RxID:   [id number] DULCOLAX 5 MG  TBEC (BISACODYL) Day before procedure take 2 at 3pm and 2 at 8pm.  #4 x 0   Entered by:   Karl Bales RN   Authorized by:   Hart Carwin MD   Signed by:   Karl Bales RN on 03/16/2010   Method used:   [facility]        [facility] [phone]* (retail)       [address]       [place], [place]  [address]       Ph:  [phone] or [phone]       Fax: [phone]   RxID:   [id number]

## 2010-09-14 NOTE — Assessment & Plan Note (Signed)
Summary: FU/NWS   Vital Signs:  Patient profile:   70 year old female Height:      65 inches Weight:      208 pounds BMI:     34.74 O2 Sat:      97 % on Room air Temp:     98.0 degrees F oral Pulse rate:   82 / minute BP sitting:   120 / 68  (left arm) Cuff size:   regular  Vitals Entered By: Bill Salinas CMA (March 03, 2010 10:35 AM)  O2 Flow:  Room air CC: follow-up visit/ ab Comments Pt no longer taking Miconazole   Primary Care Provider:  Ricarda Atayde  CC:  follow-up visit/ ab.  History of Present Illness: Patient presents for follow-up of Diabetes. She is tolerating [place]. Her CBGs have been 150-180. CBG will drop to 120's after walking. She has not had any hypoglycemic episodes.  Current Medications (verified): 1)  Ropinirole Hcl 3 Mg Tabs (Ropinirole Hcl) .Marland Kitchen.. 1 By Mouth At Bedtime 2)  Prilosec 40 Mg  Cpdr (Omeprazole) .... Once Daily 3)  Tylenol/codeine #3 300-30 Mg  Tabs (Acetaminophen-Codeine) .... Every Am 4)  Ambien 10 Mg  Tabs (Zolpidem Tartrate) .... As Needed 5)  Simvastatin 40 Mg Tabs (Simvastatin) .Marland Kitchen.. 1 in Pm For Cholesterol 6)  Lisinopril 10 Mg Tabs (Lisinopril) .Marland Kitchen.. 1 By Mouth Once Daily For Blood Pressure and Renal Protection. 7)  Bayer Breeze 2 Test  Disk (Glucose Blood) .... Test Two Times A Day As Needed Dx 250.02 8)  Januvia 100 Mg Tabs (Sitagliptin Phosphate) .Marland Kitchen.. 1 By Mouth Once Daily 9)  Glimepiride 4 Mg Tabs (Glimepiride) .Marland Kitchen.. 1 By Mouth Once A Day 10)  Miconazole 3 200-2 Mg-% (9gm) Kit (Miconazole Nitrate) .... Use As Directed For Yeast  Allergies (verified): No Known Drug Allergies PMH-FH-SH reviewed-no changes except otherwise noted  Review of Systems       The patient complains of weight gain.  The patient denies anorexia, fever, weight loss, chest pain, dyspnea on exertion, abdominal pain, muscle weakness, difficulty walking, depression, abnormal bleeding, and enlarged lymph nodes.    Physical Exam  General:  overweight white female in no  distress Eyes:  C&S clear Lungs:  normal respiratory effort.   Heart:  normal rate and regular rhythm.   Msk:  normal ROM.   Neurologic:  alert & oriented X3 and gait normal.   Skin:  turgor normal and color normal.   Psych:  Oriented X3 and good eye contact.     Impression & Recommendations:  Problem # 1:  DIABETES MELLITUS, TYPE II, UNCONTROLLED (ICD-250.02) Suboptimal control but no hypoglycemic episodes.  Plan - continue januvia 100mg  once daily           increase glimeperide to 8mg  qAM           if CBGs do not reflect adequate control will need to consider changing to basal insulin therapy.  Her updated medication list for this problem includes:    Lisinopril 10 Mg Tabs (Lisinopril) .Marland Kitchen... 1 by mouth once daily for blood pressure and renal protection.    Januvia 100 Mg Tabs (Sitagliptin phosphate) .Marland Kitchen... 1 by mouth once daily    Glimepiride 4 Mg Tabs (Glimepiride) .Marland Kitchen... 1 by mouth once a day  Complete Medication List: 1)  Ropinirole Hcl 3 Mg Tabs (Ropinirole hcl) .Marland Kitchen.. 1 by mouth at bedtime 2)  Prilosec 40 Mg Cpdr (Omeprazole) .... Once daily 3)  Tylenol/codeine #3 300-30 Mg Tabs (Acetaminophen-codeine) .... Every  am 4)  Ambien 10 Mg Tabs (Zolpidem tartrate) .... As needed 5)  Simvastatin 40 Mg Tabs (Simvastatin) .Marland Kitchen.. 1 in pm for cholesterol 6)  Lisinopril 10 Mg Tabs (Lisinopril) .Marland Kitchen.. 1 by mouth once daily for blood pressure and renal protection. 7)  Bayer Breeze 2 Test Disk (Glucose blood) .... Test two times a day as needed dx 250.02 8)  Januvia 100 Mg Tabs (Sitagliptin phosphate) .Marland Kitchen.. 1 by mouth once daily 9)  Glimepiride 4 Mg Tabs (Glimepiride) .Marland Kitchen.. 1 by mouth once a day 10)  Miconazole 3 200-2 Mg-% (9gm) Kit (Miconazole nitrate) .... Use as directed for yeast Prescriptions: GLIMEPIRIDE 4 MG TABS (GLIMEPIRIDE) 1 by mouth once a day  #60 x 6   Entered and Authorized by:   Jacques Navy MD   Signed by:   Jacques Navy MD on 03/03/2010   Method used:   [facility]ron[facility]  to        [facility] [phone]* (retail)       [address]       [place], [place]  [address]       Ph: [phone] or [phone]       Fax: [phone]   RxID:   [id number] JANUVIA 100 MG TABS (SITAGLIPTIN PHOSPHATE) 1 by mouth once daily  #30 x 12   Entered and Authorized by:   Jacques Navy MD   Signed by:   Jacques Navy MD on 03/03/2010   Method used:   [facility]ron[facility]        [facility] [phone]* (retail)       [address]       [place], [place]  [address]       Ph: [phone] or [phone]       Fax: [phone]   RxID:   [id number]

## 2010-09-14 NOTE — Progress Notes (Signed)
Summary: Elevated cbgs  Phone Note Call from Patient Call back at Home Phone [phone] Call back at Work Phone [phone] Call back at [phone]   Summary of Call: Pt is very concerned about elevated cbg's. #'s have been running in the 150's up to as high as 200. She does not feel that victoza is working. Pt is using 1.2mg  once daily. Pt wants rx for med that is generic, she is worried about high cost of medication. I provided a sample for patient to pick up b/c she will be out of med tomorrow.   Does pt need office visit for re-eval? Any labs needed prior?   Initial call taken by: Lamar Sprinkles, CMA,  June 23, 2010 3:56 PM  Follow-up for Phone Call        referral to Dr. Everardo All for DM management Follow-up by: Jacques Navy MD,  June 23, 2010 10:51 PM  Additional Follow-up for Phone Call Additional follow up Details #1::        Pt informed, transferred to scheduler for new pt apt w/[facility] Additional Follow-up by: Lamar Sprinkles, CMA,  June 24, 2010 10:16 AM

## 2010-09-14 NOTE — Procedures (Signed)
Summary: Colonoscopy  Patient: Carrie White Note: All result statuses are Final unless otherwise noted.  Tests: (1) Colonoscopy (COL)   COL Colonoscopy           DONE     [facility]     [address]     [place], [place]  [address]           COLONOSCOPY PROCEDURE REPORT           PATIENT:  Carrie White, Carrie White  MR#:  [id number]     BIRTHDATE:  [birth year], 69 yrs. old  GENDER:  female     ENDOSCOPIST:  Hedwig Morton. Juanda Chance, MD     REF. BY:  Rosalyn Gess. [name], M.D.     PROCEDURE DATE:  03/30/2010     PROCEDURE:  Colonoscopy 19[address]     ASA CLASS:  Class I     INDICATIONS:  Routine Risk Screening     MEDICATIONS:   Versed 8 mg, Fentanyl 75 mcg           DESCRIPTION OF PROCEDURE:   After the risks benefits and     alternatives of the procedure were thoroughly explained, informed     consent was obtained.  Digital rectal exam was performed and     revealed no rectal masses.   The LB [id number] endoscope     was introduced through the anus and advanced to the cecum, which     was identified by both the appendix and ileocecal valve, without     limitations.  The quality of the prep was excellent, using     MiraLax.  The instrument was then slowly withdrawn as the colon     was fully examined.     <<PROCEDUREIMAGES>>           FINDINGS:  Three polyps were found. three diminutive polyps at     15-17 cm The polyps were removed using cold biopsy forceps. Polyp     was snared without cautery. Retrieval was successful (see image1,     image6, and image7). snare polyp  Mild diverticulosis was found     (see image2).  This was otherwise a normal examination of the     colon (see image9, image5, image4, and image3).   Retroflexed     views in the rectum revealed no abnormalities.    The scope was     then withdrawn from the patient and the procedure completed.     COMPLICATIONS:  None     ENDOSCOPIC IMPRESSION:     1) Three polyps     2) Mild diverticulosis     3) Otherwise normal  examination     RECOMMENDATIONS:     1) Await pathology results     2) High fiber diet.     REPEAT EXAM:  In 10 year(s) for.  7-10 years depending on polyp     pathology           ______________________________     Hedwig Morton. Juanda Chance, MD           CC:           n.     eSIGNED:   Hedwig Morton. Brodie at 03/30/2010 09:10 AM           Randel [employer], [id number]  Note: An exclamation mark (!) indicates a result that was not dispersed into the flowsheet. Document Creation Date: 03/30/2010 9:10 AM _______________________________________________________________________  (1) Order result status: Final Collection  or observation date-time: 03/30/2010 09:03 Requested date-time:  Receipt date-time:  Reported date-time:  Referring Physician:   Ordering Physician: Lina Sar [phone]) Specimen Source:  Source: Launa Grill Order Number: [id number] Lab site:   Appended Document: Colonoscopy     Procedures Next Due Date:    Colonoscopy: 04/2020

## 2010-09-14 NOTE — Assessment & Plan Note (Signed)
Summary: 2 DAY FU ON BP /NWS   Vital Signs:  Patient profile:   70 year old female Height:      65 inches Weight:      193.25 pounds BMI:     32.27 O2 Sat:      98 % on Room air Temp:     97.3 degrees F oral Pulse rate:   80 / minute BP sitting:   130 / 86  (left arm) Cuff size:   regular  Vitals Entered By: Lucious Groves (October 30, 2009 9:37 AM)  O2 Flow:  Room air CC: 2 day follow up on blood sugar-Pt c/o excessive thirst and yeast infection./kb Comments Per patient her level was 206 last night, and she has not taken it this morning./kb Checked level--213. Lucious Groves  October 30, 2009 9:50 AM    Primary Care Provider:  [name]  CC:  2 day follow up on blood sugar-Pt c/o excessive thirst and yeast infection./kb.  History of Present Illness: here for dm follow up - added amaryl to actos <48h ago sugars better, down to 200s -  no symptoms hypoglycemia - still with burning and itch "down there"  Current Medications (verified): 1)  Ropinirole Hcl 3 Mg Tabs (Ropinirole Hcl) .Marland Kitchen.. 1 By Mouth At Bedtime 2)  Prilosec 40 Mg  Cpdr (Omeprazole) .... Once Daily 3)  Tylenol/codeine #3 300-30 Mg  Tabs (Acetaminophen-Codeine) .... Every Am 4)  Ambien 10 Mg  Tabs (Zolpidem Tartrate) .... As Needed 5)  Simvastatin 40 Mg Tabs (Simvastatin) .Marland Kitchen.. 1 in Pm For Cholesterol 6)  Lisinopril 10 Mg Tabs (Lisinopril) .Marland Kitchen.. 1 By Mouth Once Daily For Blood Pressure and Renal Protection. 7)  Bayer Breeze 2 Test  Disk (Glucose Blood) .... Test Two Times A Day As Needed Dx 250.02 8)  Actos 45 Mg Tabs (Pioglitazone Hcl) .... Take 1 By Mouth Once Daily 9)  Glimepiride 2 Mg Tabs (Glimepiride) .Marland Kitchen.. 1 By Mouth Two Times A Day 10)  Fluconazole 150 Mg Tabs (Fluconazole) .Marland Kitchen.. 1 By Mouth X 1 For Yeast  Allergies (verified): No Known Drug Allergies  Past History:  Past Medical History: Reviewed history from 10/28/2009 and no changes required. HYPERTENSION  DIABETES MELLITUS, TYPE II, UNCONTROLLED    HYPERLIPIDEMIA OSTEOARTHRITIS  FIBROMYALGIA  RESTLESS LEG SYNDROME   Review of Systems       The patient complains of fever, abdominal pain, hematuria, and incontinence.         +dysuria, +vag itch and discharge (white) -  Physical Exam  General:  overweight white female in no distress Lungs:  normal respiratory effort, no intercostal retractions or use of accessory muscles; normal breath sounds bilaterally - no crackles and no wheezes.    Heart:  normal rate, regular rhythm, no murmur, and no rub. BLE without edema.  Neurologic:  alert & oriented X3, cranial nerves II-XII intact, strength normal in all extremities, and gait normal.   Psych:  Oriented X3, memory intact for recent and remote, normally interactive, good eye contact, min anxious appearing, not depressed appearing, and not agitated.      Impression & Recommendations:  Problem # 1:  DIABETES MELLITUS, TYPE II, UNCONTROLLED (ICD-250.02)  Her updated medication list for this problem includes:    Lisinopril 10 Mg Tabs (Lisinopril) .Marland Kitchen... 1 by mouth once daily for blood pressure and renal protection.    Actos 45 Mg Tabs (Pioglitazone hcl) .Marland Kitchen... Take 1 by mouth once daily    Glimepiride 4 Mg Tabs (Glimepiride) .Marland KitchenMarland KitchenMarland KitchenMarland Kitchen 1  by mouth two times a day  intol of metformin due to gi side effects (though tol ok from 07/2008-07/2009...) add second med, amaryl 2 days ago - continue to titrate now no evidence for HONK or DKA on hx or examr/o UTI (infx ) as exac DM control  Labs Reviewed: Creat: 0.6 (10/14/2008)    Reviewed HgBA1c results: 7.4 (09/22/2009)  7.5 (06/23/2009)  Orders: Glucose, (CBG) (44010) Prescription Created Electronically [phone])  Problem # 2:  DYSURIA (ICD-788.1) ?UTI - check UA and Ucx now - emperic tx with cipro x 3 days and repeat yeast tx (see next) Her updated medication list for this problem includes:    Ciprofloxacin Hcl 500 Mg Tabs (Ciprofloxacin hcl) .Marland Kitchen... 1 by mouth two times a day x 3  days  Orders: TLB-Udip w/ Micro (81001-URINE) T-Urine Culture (Spectrum Order) [id number]) Prescription Created Electronically [phone])  Problem # 3:  VAGINITIS, CANDIDAL (ICD-112.1)  Her updated medication list for this problem includes:    Fluconazole 150 Mg Tabs (Fluconazole) .Marland Kitchen... 1 by mouth x 1 for yeast  Discussed treatment regimen and preventive measures.   Orders: Prescription Created Electronically [phone])  Complete Medication List: 1)  Ropinirole Hcl 3 Mg Tabs (Ropinirole hcl) .Marland Kitchen.. 1 by mouth at bedtime 2)  Prilosec 40 Mg Cpdr (Omeprazole) .... Once daily 3)  Tylenol/codeine #3 300-30 Mg Tabs (Acetaminophen-codeine) .... Every am 4)  Ambien 10 Mg Tabs (Zolpidem tartrate) .... As needed 5)  Simvastatin 40 Mg Tabs (Simvastatin) .Marland Kitchen.. 1 in pm for cholesterol 6)  Lisinopril 10 Mg Tabs (Lisinopril) .Marland Kitchen.. 1 by mouth once daily for blood pressure and renal protection. 7)  Bayer Breeze 2 Test Disk (Glucose blood) .... Test two times a day as needed dx 250.02 8)  Actos 45 Mg Tabs (Pioglitazone hcl) .... Take 1 by mouth once daily 9)  Glimepiride 4 Mg Tabs (Glimepiride) .Marland Kitchen.. 1 by mouth two times a day 10)  Fluconazole 150 Mg Tabs (Fluconazole) .Marland Kitchen.. 1 by mouth x 1 for yeast 11)  Ciprofloxacin Hcl 500 Mg Tabs (Ciprofloxacin hcl) .Marland Kitchen.. 1 by mouth two times a day x 3 days 12)  Miconazole 3 200-2 Mg-% (9gm) Kit (Miconazole nitrate) .... Use as directed for yeast  Patient Instructions: 1)  it was good to see you today.  2)  sugars better - but will keep working on this: 3)  increase generic amaryl to 4mg  two times a day - continue this with actos 45mg  once daily  4)  for the yeast and burnoing, will check a urine sample now - you will be called next week with these results - 5)  in the meanwhile, treat for yeast and possible urinary infection as discussed - 6)  your prescriptions have been electronically submitted to your pharmacy. Please take as directed. Contact our office if you  believe you're having problems with the medication(s).  7)  Please schedule a follow-up appointment in 2 months to review, sooner if problems.  Prescriptions: MICONAZOLE 3 200-2 MG-% (9GM) KIT (MICONAZOLE NITRATE) use as directed for yeast  #1 x 0   Entered and Authorized by:   Newt Lukes MD   Signed by:   Newt Lukes MD on 10/30/2009   Method used:   Electronically to        [facility]  [address] [phone]* (retail)       [address]       [place], [place]  [address]  Ph: [phone] or [phone]       Fax: [phone]   RxID:   [id number] CIPROFLOXACIN HCL 500 MG TABS (CIPROFLOXACIN HCL) 1 by mouth two times a day x 3 days  #6 x 0   Entered and Authorized by:   Newt Lukes MD   Signed by:   Newt Lukes MD on 10/30/2009   Method used:   [facility]        [facility]  [address] [phone]* (retail)       [address]       [place], [place]  [address]       Ph: [phone] or [phone]       Fax: [phone]   RxID:   [id number] FLUCONAZOLE 150 MG TABS (FLUCONAZOLE) 1 by mouth x 1 for yeast  #3 x 0   Entered and Authorized by:   Newt Lukes MD   Signed by:   Newt Lukes MD on 10/30/2009   Method used:   [facility]        [facility]  [address] [phone]* (retail)       [address]       [place], [place]  [address]       Ph: [phone] or [phone]       Fax: [phone]   RxID:   [id number] GLIMEPIRIDE 4 MG TABS (GLIMEPIRIDE) 1 by mouth two times a day  #60 x 3   Entered and Authorized by:   Newt Lukes MD   Signed by:   Newt Lukes MD on 10/30/2009   Method used:   [facility]        [facility]  [address] [phone]* (retail)       [address]       [place], [place]  [address]       Ph: [phone] or [phone]       Fax: [phone]   RxID:   [id number]

## 2010-09-14 NOTE — Assessment & Plan Note (Signed)
Summary: New Endo/Diabetes/[name]/Bcbs/#/cd   Vital Signs:  Patient profile:   70 year old female Height:      65 inches (165.10 cm) Weight:      196 pounds (89.09 kg) BMI:     32.73 O2 Sat:      97 % on Room air Temp:     98.0 degrees F (36.67 degrees C) oral Pulse rate:   80 / minute Pulse rhythm:   regular BP sitting:   110 / 72  (left arm) Cuff size:   large  Vitals Entered By: Brenton Grills CMA Duncan Dull) (July 01, 2010 9:49 AM)  O2 Flow:  Room air CC: New Endo Consult/DMII/Dr. [name]/aj Is Patient Diabetic? Yes   Primary Provider:  [name]  CC:  New Endo Consult/DMII/Dr. [name]/aj.  History of Present Illness: pt states 4 years h/o dm.  he is unaware of any chronic complications.  it is complicated by .  she has never been on insulin.  metformin had to be stopped due to diarrhea.  she stopped actos, because itr did not work, and she had weight gain.  she takes [place] and victoza.  she brings a record of her cbg's which i have reviewed today.  they have increased to 160-200.   pt says his diet and exercise are both "good."   symptomatically, pt states 16 years of moderate arthralgias, worst at the hips, but no assoc fever  Current Medications (verified): 1)  Ropinirole Hcl 3 Mg Tabs (Ropinirole Hcl) .Marland Kitchen.. 1 By Mouth At Bedtime 2)  Prilosec 40 Mg  Cpdr (Omeprazole) .... Once Daily 3)  Tylenol/codeine #3 300-30 Mg  Tabs (Acetaminophen-Codeine) .... Every Am 4)  Ambien 10 Mg  Tabs (Zolpidem Tartrate) .... As Needed 5)  Simvastatin 40 Mg Tabs (Simvastatin) .Marland Kitchen.. 1 in Pm For Cholesterol 6)  Lisinopril 10 Mg Tabs (Lisinopril) .Marland Kitchen.. 1 By Mouth Once Daily For Blood Pressure and Renal Protection. 7)  Bayer Breeze 2 Test  Disk (Glucose Blood) .... Test Two Times A Day As Needed Dx 250.02 8)  Januvia 100 Mg Tabs (Sitagliptin Phosphate) .Marland Kitchen.. 1 By Mouth Once Daily 9)  Miconazole 3 200-2 Mg-% (9gm) Kit (Miconazole Nitrate) .... Use As Directed For Yeast 10)  Victoza 18 Mg/41ml Soln  (Liraglutide) .... 0.6 Mg Subcutaneously Once Daily X 7 Then 1.2 Mg Subcutaneously Once Daily. 11)  Bd Pen Needle Ultrafine 29g X 12.4mm Misc (Insulin Pen Needle) .Marland Kitchen.. 1 Needle Once Daily  Allergies (verified): No Known Drug Allergies  Past History:  Past Medical History: Last updated: 10/28/2009 HYPERTENSION  DIABETES MELLITUS, TYPE II, UNCONTROLLED  HYPERLIPIDEMIA OSTEOARTHRITIS  FIBROMYALGIA  RESTLESS LEG SYNDROME   Family History: Reviewed history from 12/13/2007 and no changes required. father - deceased: colon cancer with mets, lung cancer mother - deceased @ 67: CVA, HTN Brother - HTN, CVA Brother - prostate cancer 2 sisters with breast cancer 1 sister with leukemia and type 1 dm, deceased at 57 1 sister with lupus brother with diabetes  Social History: Reviewed history from 12/13/2007 and no changes required. 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  she has re-lost the weight she gained, due to her efforts.  she has excessive diaphoresis , and mild memory loss.   Physical Exam  General:  obese.  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 Normal respiratory effort.  Heart:  Regular rate and rhythm without murmurs or gallops noted. Normal S1,S2.   Abdomen:  abdomen is soft, nontender.  no hepatosplenomegaly.   not distended.  no hernia  Msk:  muscle bulk and strength are grossly normal.  no obvious joint swelling.  gait is normal and steady  Pulses:  dorsalis pedis intact bilat.  no carotid bruit  Extremities:  no  deformity.  no ulcer on the feet.  feet are of normal color and temp.  no edema mycotic toenails and ingrown toenails.   Neurologic:  cn 2-12 grossly intact.   readily moves all 4's.   sensation is intact to touch on the feet  Skin:  normal texture and temp.  no rash.  not diaphoretic  Cervical Nodes:  No significant adenopathy.  Psych:  Alert and cooperative; normal mood and affect; normal attention span and concentration.     Impression & Recommendations:  Problem # 1:  DIABETES MELLITUS, TYPE II, UNCONTROLLED (ICD-250.02) needs increased rx  Problem # 2:  DIARRHEA, PERSISTENT (ICD-787.91) metformin could contribute, but it is worth re-trying a low dosage  Problem # 3:  OSTEOARTHRITIS (ICD-715.90) this limits exercise rx  Medications Added to Medication List This Visit: 1)  Victoza 18 Mg/20ml Soln (Liraglutide) .... 1.8 mg subcutaneously each am. 2)  Metformin Hcl 500 Mg Xr24h-tab (Metformin hcl) .Marland Kitchen.. 1 tab at bedtime  Other Orders: TLB-TSH (Thyroid Stimulating Hormone) (84443-TSH) TLB-A1C / Hgb A1C (Glycohemoglobin) (83036-A1C) Est. Patient Level V ([address])  Patient Instructions: 1)  good diet and exercise habits significanly improve the control of your diabetes.  please let me know if you wish to be referred to a dietician.  high blood sugar is very risky to your health.  you should see an eye doctor every year. 2)  controlling your blood pressure and cholesterol drastically reduces the damage diabetes does to your body.  this also applies to quitting smoking.  please discuss these with your doctor.  you should take an aspirin every day, unless you have been advised by a doctor not to. 3)  we will need to take this complex situation in stages 4)  check your blood sugar 2 times a day.  vary the time of day when you check, between before the 3 meals, and at bedtime.  also check if you have symptoms of your blood sugar being too high or too low.  please keep a record of the readings  and bring it to your next appointment here.  please call us sooner if you are having low blood sugar episodes. 5)  blood tests are being ordered for you today.  please call [phone] to hear your test results. 6)  for now, stop januvia, and increase victoza to 1.8 mg once daily, and: 7)  resume metformin at 500 mg at bedtime 8)  return in 4 weeks. Prescriptions: METFORMIN HCL 500 MG XR24H-TAB (METFORMIN HCL) 1 tab at bedtime  #30 x 11   Entered and Authorized by:   Minus Breeding MD   Signed by:   Minus Breeding MD on 07/01/2010   Method used:   [facility]        [facility]  [address] [phone]* (retail)       [address]       [facility]       [place], [place]  [address]       Ph: [phone] or [phone]       Fax: [phone]   RxID:   [id number] VICTOZA 18 MG/3ML SOLN (LIRAGLUTIDE) 1.8 mg subcutaneously each am.  #30 days x 11   Entered and Authorized by:   Minus Breeding MD   Signed by:   Minus Breeding MD on 07/01/2010   Method used:   [facility]        [facility]  [address] [phone]* (retail)       [address]       [place], [place]  [address]       Ph: [phone] or [phone]       Fax: [phone]   RxID:   [id number]    Orders Added: 1)  TLB-TSH (Thyroid Stimulating Hormone) [84443-TSH] 2)  TLB-A1C / Hgb A1C (Glycohemoglobin) [83036-A1C] 3)  Est. Patient Level V [[address]]

## 2010-09-14 NOTE — Letter (Signed)
Summary: Patient Notice- Polyp Results  [facility]  [address] [place], [place] [address]   Phone: [phone]  Fax: [phone]        April 01, 2010 MRN: [id number]    Carrie White [address] [place], [place]  [address]    Dear Ms. [facility]  I am pleased to inform you that the colon polyp(s) removed during your recent colonoscopy was (were) found to be benign (no cancer detected) upon pathologic examination.The polyps were hyperplastic ( not precancerous)  I recommend you have a repeat colonoscopy examination in 10 _ years to look for recurrent polyps, as having colon polyps increases your risk for having recurrent polyps or even colon cancer in the future.  Should you develop new or worsening symptoms of abdominal pain, bowel habit changes or bleeding from the rectum or bowels, please schedule an evaluation with either your primary care physician or with me.  Additional information/recommendations:  _x_ No further action with gastroenterology is needed at this time. Please      follow-up with your primary care physician for your other healthcare      needs.  __ Please call [phone] to schedule a return visit to review your      situation.  __ Please keep your follow-up visit as already scheduled.  __ Continue treatment plan as outlined the day of your exam.  Please call us if you are having persistent problems or have questions about your condition that have not been fully answered at this time.  Sincerely,  Hart Carwin MD  This letter has been electronically signed by your physician.  Appended Document: Patient Notice- Polyp Results letter mailed

## 2010-09-14 NOTE — Progress Notes (Signed)
Summary: Januvia  Phone Note Call from Patient   Caller: Patient Call For: Jacques Navy MD Summary of Call: pt called stating she has been taking Januvia 1 once daily and Glimeperide 1 once daily.  FBS are running between 150-180.  She states she is tolerating med well but is not happy with glucose numbers.  She is also requesting a new weight loss med.  Per Dr. Debby Bud pt needs an appt.  She will be out of Januvia this sat. Initial call taken by: Lanier Prude, [facility]),  February 24, 2010 1:49 PM  Follow-up for Phone Call        Januvia 100mg  samples given to pt. Appt sched 03-03-10 Follow-up by: Lanier Prude, [facility]),  February 24, 2010 2:31 PM

## 2010-09-14 NOTE — Assessment & Plan Note (Signed)
Summary: BS IS HIGH - 400 LAST NIGHT / TODAY 300/ MEN'S PT/ PER TRIAGE...   Vital Signs:  Patient profile:   70 year old female Height:      65 inches (165.10 cm) Weight:      191.8 pounds (87.18 kg) O2 Sat:      99 % on Room air Temp:     97.5 degrees F (36.39 degrees C) oral Pulse rate:   90 / minute BP sitting:   128 / 70  (left arm) Cuff size:   large  Vitals Entered By: Orlan Leavens (October 28, 2009 2:46 PM)  O2 Flow:  Room air CC: Elevated BS. Pt states BS been running 250-350's. This am check BS 390 Is Patient Diabetic? Yes Did you bring your meter with you today? No Pain Assessment Patient in pain? no      CBG Result 320   Primary Care Provider:  [name]  CC:  Elevated BS. Pt states BS been running 250-350's. This am check BS 390.  History of Present Illness: here with c/o uncontrolled sugars/DM. stopped metformin  2 weeks ago due ongoing n/v/d for last 4 weeks. gi symptoms improved but sugars went high started actos 30 once daily but sugars still rising, 200-300 pt called and told to inc actos to 45mg  once daily.  too hard to break tabs so taking 30mg  two times a day instead. sugars still rising, 300-400 and out of actos since doubled up. denies abd pain, fever or cp symptoms.  no further n, v, or d high sugars a/w yeast infx symptoms and vag itching. feels anxious to get prob fixed b/c leaving on vacation in 2 weeks.  Current Medications (verified): 1)  Ropinirole Hcl 3 Mg Tabs (Ropinirole Hcl) .Marland Kitchen.. 1 By Mouth At Bedtime 2)  Prilosec 40 Mg  Cpdr (Omeprazole) .... Once Daily 3)  Tylenol/codeine #3 300-30 Mg  Tabs (Acetaminophen-Codeine) .... Every Am 4)  Ambien 10 Mg  Tabs (Zolpidem Tartrate) .... As Needed 5)  Simvastatin 40 Mg Tabs (Simvastatin) .Marland Kitchen.. 1 in Pm For Cholesterol 6)  Actos 45 Mg Tabs (Pioglitazone Hcl) .... Take 1 1/2 Once Daily 7)  Lisinopril 10 Mg Tabs (Lisinopril) .Marland Kitchen.. 1 By Mouth Once Daily For Blood Pressure and Renal Protection. 8)  Bayer  Breeze 2 Test  Disk (Glucose Blood) .... Test Two Times A Day As Needed Dx 250.02  Allergies (verified): No Known Drug Allergies  Past History:  Past Medical History: HYPERTENSION  DIABETES MELLITUS, TYPE II, UNCONTROLLED  HYPERLIPIDEMIA OSTEOARTHRITIS  FIBROMYALGIA  RESTLESS LEG SYNDROME   Review of Systems  The patient denies anorexia, fever, weight loss, weight gain, chest pain, syncope, peripheral edema, headaches, abdominal pain, severe indigestion/heartburn, muscle weakness, and depression.    Physical Exam  General:  overweight white female in no distress Lungs:  normal respiratory effort, no intercostal retractions or use of accessory muscles; normal breath sounds bilaterally - no crackles and no wheezes.    Heart:  normal rate, regular rhythm, no murmur, and no rub. BLE without edema.  Abdomen:  Bowel sounds positive,abdomen soft and non-tender without masses, organomegaly or hernias noted.   Impression & Recommendations:  Problem # 1:  DIABETES MELLITUS, TYPE II, UNCONTROLLED (ICD-250.02)  intol of metformin due to gi side effects (though tol ok from 07/2008-07/2009...) e.xceeding max rec dose actos at this time accidentally- eduacted pt on same add second med, amaryl titrate as needed  no evidence for HONK or DKA on hx or exam recheck 48h, consider  need forvothervtesting pn  Time spent with patient 26 minutes, more than 50% of this time was spent counseling patient on diabetes and meds for mgmt of same, possible insulin use infuture. Her updated medication list for this problem includes:    Lisinopril 10 Mg Tabs (Lisinopril) .Marland Kitchen... 1 by mouth once daily for blood pressure and renal protection.    Actos 45 Mg Tabs (Pioglitazone hcl) .Marland Kitchen... Take 1 by mouth once daily    Glimepiride 2 Mg Tabs (Glimepiride) .Marland Kitchen... 1 by mouth two times a day  Orders: Glucose, (CBG) [phone]) Prescription Created Electronically [phone])  Labs Reviewed: Creat: 0.6 (10/14/2008)      Reviewed HgBA1c results: 7.4 (09/22/2009)  7.5 (06/23/2009)  Problem # 2:  VAGINITIS, CANDIDAL (ICD-112.1)  dx by hx, exac by #1 Her updated medication list for this problem includes:    Fluconazole 150 Mg Tabs (Fluconazole) .Marland Kitchen... 1 by mouth x 1 for yeast  Orders: Prescription Created Electronically [phone])  Complete Medication List: 1)  Ropinirole Hcl 3 Mg Tabs (Ropinirole hcl) .Marland Kitchen.. 1 by mouth at bedtime 2)  Prilosec 40 Mg Cpdr (Omeprazole) .... Once daily 3)  Tylenol/codeine #3 300-30 Mg Tabs (Acetaminophen-codeine) .... Every am 4)  Ambien 10 Mg Tabs (Zolpidem tartrate) .... As needed 5)  Simvastatin 40 Mg Tabs (Simvastatin) .Marland Kitchen.. 1 in pm for cholesterol 6)  Lisinopril 10 Mg Tabs (Lisinopril) .Marland Kitchen.. 1 by mouth once daily for blood pressure and renal protection. 7)  Bayer Breeze 2 Test Disk (Glucose blood) .... Test two times a day as needed dx 250.02 8)  Actos 45 Mg Tabs (Pioglitazone hcl) .... Take 1 by mouth once daily 9)  Glimepiride 2 Mg Tabs (Glimepiride) .Marland Kitchen.. 1 by mouth two times a day 10)  Fluconazole 150 Mg Tabs (Fluconazole) .Marland Kitchen.. 1 by mouth x 1 for yeast  Patient Instructions: 1)  it was good to see you today. 2)  will add second medication - generic amaryl - to the Actos 45mg  tabs (max dose actos = 45mg /day!) - your prescriptions have been electronically submitted to your pharmacy. Please take as directed. Contact our office if you believe you're having problems with the medication(s).  3)  also diflucan pill for yeast - 4)  Please schedule a follow-up appointment in next 48 hours to review sugars, call sooner if problems.  Prescriptions: ACTOS 45 MG TABS (PIOGLITAZONE HCL) take 1 by mouth once daily  #30 x 2   Entered by:   Orlan Leavens   Authorized by:   Newt Lukes MD   Signed by:   Orlan Leavens on 10/28/2009   Method used:   [facility] to        [facility] [address] [phone][facility] (retail)       [address]       [place], [place]   [address]       Ph: [phone] or [phone]       Fax: [phone]   RxID:   [id number] FLUCONAZOLE 150 MG TABS (FLUCONAZOLE) 1 by mouth x 1 for yeast  #1 x 1   Entered and Authorized by:   Newt Lukes MD   Signed by:   Newt Lukes MD on 10/28/2009   Method used:   [facility]        [facility] [address] [phone][facility] ([facility]ail)       [address]       [facility]  [place], [place]  [address]       Ph: [phone] or [phone]       Fax: [phone]   RxID:   [id number] GLIMEPIRIDE 2 MG TABS (GLIMEPIRIDE) 1 by mouth two times a day  #60 x 3   Entered and Authorized by:   Newt Lukes MD   Signed by:   Newt Lukes MD on 10/28/2009   Method used:   [facility]        [facility] [phone]* (retail)       [address]       [place], [place]  [address]       Ph: [phone] or [phone]       Fax: [phone]   RxID:   [id number] ACTOS 45 MG TABS (PIOGLITAZONE HCL) take 1 by mouth once daily  #30 x 2   Entered and Authorized by:   Newt Lukes MD   Signed by:   Newt Lukes MD on 10/28/2009   Method used:   [facility]        [facility] [phone]* (retail)       [address]       [place], [place]  [address]       Ph: [phone] or [phone]       Fax: [phone]   RxID:   [id number]   Laboratory Results   Blood Tests     CBG Random:: 320mg /dL

## 2010-09-14 NOTE — Assessment & Plan Note (Signed)
Summary: PER PT 3 MTH FU  STC   Vital Signs:  Patient profile:   70 year old female Height:      65 inches Weight:      190 pounds BMI:     31.73 O2 Sat:      97 % on Room air Temp:     98.2 degrees F oral Pulse rate:   90 / minute BP sitting:   124 / 74  (left arm) Cuff size:   large  Vitals Entered By: Bill Salinas CMA (September 25, 2009 10:28 AM)  O2 Flow:  Room air CC: pt here for follow up office visit. she complains of occasional nausea and diarrhea/ pt has never had a colonoscopy/ab   Primary Care Provider:  Lakita Sahlin  CC:  pt here for follow up office visit. she complains of occasional nausea and diarrhea/ pt has never had a colonoscopy/ab.  History of Present Illness: Patient presents for medical follow-up. she reports that she is battling N/V 2-3 times a week. She is having watery loose stools up to 5 times a day. she has had fecal incontinence. The diarrhea started in July after a course of antibiotics after minor surgery.   She has otherwise been doing well.   Current Medications (verified): 1)  Ropinirole Hcl 3 Mg Tabs (Ropinirole Hcl) .Marland Kitchen.. 1 By Mouth At Bedtime 2)  Prilosec 40 Mg  Cpdr (Omeprazole) .... Once Daily 3)  Tylenol/codeine #3 300-30 Mg  Tabs (Acetaminophen-Codeine) .... Every Am 4)  Ambien 10 Mg  Tabs (Zolpidem Tartrate) .... As Needed 5)  Simvastatin 40 Mg Tabs (Simvastatin) .Marland Kitchen.. 1 in Pm For Cholesterol 6)  Metformin Hcl 1000 Mg Tabs (Metformin Hcl) .Marland Kitchen.. 1 By Mouth Two Times A Day. 7)  Lisinopril 10 Mg Tabs (Lisinopril) .Marland Kitchen.. 1 By Mouth Once Daily For Blood Pressure and Renal Protection. 8)  Bayer Breeze 2 Test  Disk (Glucose Blood) .... Test Two Times A Day As Needed Dx 250.02  Allergies (verified): No Known Drug Allergies  Past History:  Past Medical History: Last updated: 07/31/2008 HYPERTENSION (ICD-401.9) DIABETES MELLITUS, TYPE II, UNCONTROLLED (ICD-250.02) HYPERLIPIDEMIA (ICD-272.4) COUGH (ICD-786.2) URI (ICD-465.9) OSTEOARTHRITIS  (ICD-715.90) FIBROMYALGIA (ICD-729.1) RESTLESS LEG SYNDROME (ICD-333.94)  Past Surgical History: Last updated: 05/07/2007 Hysterectomy Oophorectomy  Family History: Last updated: 12/15/07 father - deceased: colon cancer with mets, lung cancer mother - deceased @ 56: CVA, HTN Brother - HTN, CVA Brother - prostate cancer 2 sisters with breast cancer 1 sister with leukemia deceased at 79 1 sister with lupus brother with diabetes  Social History: Last updated: 12/15/07 [employer], [employer]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 No apparent alopecia or balding. Lungs:  normal respiratory effort and normal breath sounds.   Heart:  normal rate and regular rhythm.   Abdomen:  obese, soft, BS+ x 4 quadrants, no guarding, rebound or tenderness   Impression & Recommendations:  Problem # 1:  HYPERTENSION (ICD-401.9)  Her updated medication list for this problem includes:    Lisinopril 10 Mg Tabs (Lisinopril) .Marland Kitchen... 1 by mouth once daily for blood pressure and renal protection.  BP today: 124/74 Prior BP: 126/80 (06/26/2009) Good control. Continue present meds  Problem # 2:  DIABETES MELLITUS, TYPE II, UNCONTROLLED (ICD-250.02) A1C 7.4% - close to goal.   Plan - continue diet and exercise  may need a change of meds - see below.  Her updated medication list for this problem includes:    Metformin Hcl 1000 Mg Tabs (Metformin hcl) .Marland Kitchen... 1 by mouth two times a day.    Lisinopril 10 Mg Tabs (Lisinopril) .Marland Kitchen... 1 by mouth once daily for blood pressure and renal protection.  Problem # 3:  HYPERLIPIDEMIA (ICD-272.4) LDL 62 - excellent control.  Plan - continue simvastatin.  Her updated medication list for this problem includes:    Simvastatin 40 Mg Tabs (Simvastatin) .Marland Kitchen... 1 in pm for  cholesterol  Problem # 4:  FIBROMYALGIA (ICD-729.1) Stable  Her updated medication list for this problem includes:    Tylenol/codeine #3 300-30 Mg Tabs (Acetaminophen-codeine) ..... Every am  Problem # 5:  DIARRHEA, PERSISTENT (ICD-787.91) Patient with persistent diarrhea and N/V. No evidence or history to suggest infection. symptoms not c/w c. diff. Strongly suspect metformin.  Plan - drug holiday from metformin. If symptoms resolve - change to Januvia or actos. Persistent symptoms - GI eval.   Complete Medication List: 1)  Ropinirole Hcl 3 Mg Tabs (Ropinirole hcl) .Marland Kitchen.. 1 by mouth at bedtime 2)  Prilosec 40 Mg Cpdr (Omeprazole) .... Once daily 3)  Tylenol/codeine #3 300-30 Mg Tabs (Acetaminophen-codeine) .... Every am 4)  Ambien 10 Mg Tabs (Zolpidem tartrate) .... As needed 5)  Simvastatin 40 Mg Tabs (Simvastatin) .Marland Kitchen.. 1 in pm for cholesterol 6)  Metformin Hcl 1000 Mg Tabs (Metformin hcl) .Marland Kitchen.. 1 by mouth two times a day. 7)  Lisinopril 10 Mg Tabs (Lisinopril) .Marland Kitchen.. 1 by mouth once daily for blood pressure and renal protection. 8)  Bayer Breeze 2 Test Disk (Glucose blood) .... Test two times a day as needed dx 250.02  Other Orders: Gastroenterology Referral (GI)  Patient Instructions: 1)  Nausea, vomiting, diarrhea - normal exam and normal labs. Suspect these are symptoms related to metformin. Plan - stop metformin for 2 weeks. Let me know if the symptoms do not resolve. If they do we will start a different medication for control of diabetes. If symptoms do not resolve we will launch a thorough evaluation to determine the cause.    Preventive Care Screening  Mammogram:    Date:  05/22/2009    Results:  normal

## 2010-09-16 NOTE — Assessment & Plan Note (Signed)
Summary: 4 WK FU  STC   Vital Signs:  Patient profile:   70 year old female Height:      65 inches (165.10 cm) Weight:      193 pounds (87.73 kg) BMI:     32.23 O2 Sat:      98 % on Room air Temp:     98.5 degrees F (36.94 degrees C) oral Pulse rate:   73 / minute Pulse rhythm:   regular BP sitting:   102 / 62  (left arm) Cuff size:   large  Vitals Entered By: Brenton Grills CMA Duncan Dull) (July 29, 2010 10:07 AM)  O2 Flow:  Room air CC: Follow-up visit/nausea, vomiting, diarrhea x 3 days/aj Is Patient Diabetic? Yes   Primary Provider:  [name]  CC:  Follow-up visit/nausea, vomiting, and diarrhea x 3 days/aj.  History of Present Illness: pt has 4 days of slight "soreness," in the abdomen, and assoc n/v/d.  husband has similar illness.   she brings a record of her cbg's which i have reviewed today.  it varies from 90-200, with no trend throughout the day.    Current Medications (verified): 1)  Ropinirole Hcl 3 Mg Tabs (Ropinirole Hcl) .Marland Kitchen.. 1 By Mouth At Bedtime 2)  Prilosec 40 Mg  Cpdr (Omeprazole) .... Once Daily 3)  Tylenol/codeine #3 300-30 Mg  Tabs (Acetaminophen-Codeine) .... Every Am 4)  Ambien 10 Mg  Tabs (Zolpidem Tartrate) .... As Needed 5)  Simvastatin 40 Mg Tabs (Simvastatin) .Marland Kitchen.. 1 in Pm For Cholesterol 6)  Lisinopril 10 Mg Tabs (Lisinopril) .Marland Kitchen.. 1 By Mouth Once Daily For Blood Pressure and Renal Protection. 7)  Bayer Breeze 2 Test  Disk (Glucose Blood) .... Test Two Times A Day As Needed Dx 250.02 8)  Miconazole 3 200-2 Mg-% (9gm) Kit (Miconazole Nitrate) .... Use As Directed For Yeast 9)  Victoza 18 Mg/37ml Soln (Liraglutide) .... 1.8 Mg Subcutaneously Each Am. 10)  Bd Pen Needle Ultrafine 29g X 12.53mm Misc (Insulin Pen Needle) .Marland Kitchen.. 1 Needle Once Daily 11)  Metformin Hcl 500 Mg Xr24h-Tab (Metformin Hcl) .Marland Kitchen.. 1 Tab At Bedtime  Allergies (verified): No Known Drug Allergies  Past History:  Past Medical History: Last updated: 10/28/2009 HYPERTENSION    DIABETES MELLITUS, TYPE II, UNCONTROLLED  HYPERLIPIDEMIA OSTEOARTHRITIS  FIBROMYALGIA  RESTLESS LEG SYNDROME   Review of Systems  The patient denies fever and hematochezia.    Physical Exam  General:  obese.  no distress  Abdomen:  abdomen is soft, nontender.  no hepatosplenomegaly.   not distended.  no hernia  Extremities:  no edema Additional Exam:  Fructosamine              225 umol/L  (converts to a1c of 5.4)   Impression & Recommendations:  Problem # 1:  DIABETES MELLITUS, TYPE II, UNCONTROLLED (ICD-250.02) Assessment Improved  Problem # 2:  viral ge new  Problem # 3:  HYPERTENSION (ICD-401.9) slightly overcontrolled due to #2  Medications Added to Medication List This Visit: 1)  Ondansetron Hcl 4 Mg Tabs (Ondansetron hcl) .Marland Kitchen.. 1 every 4 hrs as needed for nausea  Other Orders: T-Fructosamine ([id number]) Est. Patient Level IV ([address])  Patient Instructions: 1)  we will need to take this complex situation in stages 2)  check your blood sugar 2 times a day.  vary the time of day when you check, between before the 3 meals, and at bedtime.  also check if you have symptoms of your blood sugar being too high or too low.  please keep a record of the readings and bring it to your next appointment here.  please call us sooner if you are having low blood sugar episodes. 3)  blood tests are being ordered for you today.  please call [phone] to hear your test results. 4)  pending the test results, please continue the same diabetes medications for now 5)  return in 4 weeks. 6)  stop lisinopril until you have recovered from this illness.   7)  take ondansetron 4 mg every 4 hrs as needed for nausea. 8)  Please schedule a follow-up appointment in 2 months. Prescriptions: ONDANSETRON HCL 4 MG TABS (ONDANSETRON HCL) 1 every 4 hrs as needed for nausea  #30 x 1   Entered and Authorized by:   Minus Breeding MD   Signed by:   Minus Breeding MD on 07/29/2010   Method used:    Electronically to        [facility] (retail)       [address]       [place], [place]  [address]       Ph: [phone]       Fax: [phone]   RxID:   [id number]    Orders Added: 1)  T-Fructosamine [phone] 2)  Est. Patient Level IV [[address]]

## 2010-09-29 ENCOUNTER — Other Ambulatory Visit: Payer: Medicare Other

## 2010-09-29 ENCOUNTER — Other Ambulatory Visit: Payer: Self-pay | Admitting: Endocrinology

## 2010-09-29 ENCOUNTER — Encounter: Payer: Self-pay | Admitting: Endocrinology

## 2010-09-29 ENCOUNTER — Encounter (INDEPENDENT_AMBULATORY_CARE_PROVIDER_SITE_OTHER): Payer: Self-pay | Admitting: *Deleted

## 2010-09-29 ENCOUNTER — Ambulatory Visit (INDEPENDENT_AMBULATORY_CARE_PROVIDER_SITE_OTHER): Payer: Medicare Other | Admitting: Endocrinology

## 2010-09-29 DIAGNOSIS — E1165 Type 2 diabetes mellitus with hyperglycemia: Secondary | ICD-10-CM

## 2010-09-29 LAB — HEMOGLOBIN A1C: Hgb A1c MFr Bld: 7.8 % — ABNORMAL HIGH (ref 4.6–6.5)

## 2010-10-01 ENCOUNTER — Telehealth: Payer: Self-pay | Admitting: Internal Medicine

## 2010-10-01 ENCOUNTER — Telehealth: Payer: Self-pay | Admitting: Endocrinology

## 2010-10-06 NOTE — Progress Notes (Signed)
Summary: pharmacy change  Phone Note Refill Request Message from:  Patient on October 01, 2010 2:43 PM  Refills Requested: Medication #1:  BROMOCRIPTINE MESYLATE 2.5 MG TABS 1 tab once daily.   Dosage confirmed as above?Dosage Confirmed   Supply Requested: 6 months  Method Requested: Electronic Initial call taken by: Margaret Pyle, CMA,  October 01, 2010 2:43 PM    Prescriptions: BROMOCRIPTINE MESYLATE 2.5 MG TABS (BROMOCRIPTINE MESYLATE) 1 tab once daily  #30 x 11   Entered by:   Margaret Pyle, CMA   Authorized by:   Minus Breeding MD   Signed by:   Margaret Pyle, CMA on 10/01/2010   Method used:   [facility]  [address] [phone][facility] (retail)       [address]       [place], [place]  [address]       Ph: [phone] or [phone]       Fax: [phone]   RxID:   [id number]

## 2010-10-06 NOTE — Progress Notes (Signed)
Summary: refill  Phone Note Call from Patient Call back at Home Phone [phone]   Caller: Patient Call For: Dr Debby Bud Summary of Call: Pt left message on triage A: pt needs refill of Tylenol #3. Initial call taken by: Verdell Face,  October 01, 2010 3:19 PM  Follow-up for Phone Call        ok to refill # 30, sig 1 by mouth qAM, 2 add'l Follow-up by: Jacques Navy MD,  October 01, 2010 5:54 PM  Additional Follow-up for Phone Call Additional follow up Details #1::        Pt informed  Additional Follow-up by: Lamar Sprinkles, CMA,  October 01, 2010 7:10 PM    Prescriptions: TYLENOL/CODEINE #3 300-30 MG  TABS (ACETAMINOPHEN-CODEINE) every AM  #30 x 2   Entered by:   Lamar Sprinkles, CMA   Authorized by:   Jacques Navy MD   Signed by:   Lamar Sprinkles, CMA on 10/01/2010   Method used:   Telephoned to ...       [facility] [phone]* (retail)       [address]       [place], [place]  [address]       Ph: [phone] or [phone]       Fax: [phone]   RxID:   [id number]

## 2010-10-06 NOTE — Assessment & Plan Note (Signed)
Summary: 2 mos f/u#cd   Vital Signs:  Patient profile:   70 year old female Height:      65 inches (165.10 cm) Weight:      188.25 pounds (85.57 kg) BMI:     31.44 O2 Sat:      95 % on Room air Temp:     98.6 degrees F (37.00 degrees C) oral Pulse rate:   92 / minute BP sitting:   104 / 74  (left arm) Cuff size:   large  Vitals Entered By: Brenton Grills CMA Duncan Dull) (September 29, 2010 9:46 AM)  O2 Flow:  Room air CC: 2 month F/U/aj Is Patient Diabetic? Yes   Primary Provider:  [name]  CC:  2 month F/U/aj.  History of Present Illness: pt has 1 month of dry-quality cough in the chest, but no assoc sob.  this started with a uri.  she has been off the actos since last year.  she brings a record of her cbg's which i have reviewed today.  it is 150-200, with no trend throughout the day.    Current Medications (verified): 1)  Ropinirole Hcl 3 Mg Tabs (Ropinirole Hcl) .Marland Kitchen.. 1 By Mouth At Bedtime 2)  Prilosec 40 Mg  Cpdr (Omeprazole) .... Once Daily 3)  Tylenol/codeine #3 300-30 Mg  Tabs (Acetaminophen-Codeine) .... Every Am 4)  Ambien 10 Mg  Tabs (Zolpidem Tartrate) .... As Needed 5)  Simvastatin 40 Mg Tabs (Simvastatin) .Marland Kitchen.. 1 in Pm For Cholesterol 6)  Lisinopril 10 Mg Tabs (Lisinopril) .Marland Kitchen.. 1 By Mouth Once Daily For Blood Pressure and Renal Protection. 7)  Bayer Breeze 2 Test  Disk (Glucose Blood) .... Test Two Times A Day As Needed Dx 250.02 8)  Victoza 18 Mg/31ml Soln (Liraglutide) .... 1.8 Mg Subcutaneously Each Am. 9)  Bd Pen Needle Ultrafine 29g X 12.13mm Misc (Insulin Pen Needle) .Marland Kitchen.. 1 Needle Once Daily 10)  Metformin Hcl 500 Mg Xr24h-Tab (Metformin Hcl) .Marland Kitchen.. 1 Tab At Bedtime 11)  Ondansetron Hcl 4 Mg Tabs (Ondansetron Hcl) .Marland Kitchen.. 1 Every 4 Hrs As Needed For Nausea  Allergies (verified): 1)  ! Actos (Pioglitazone Hcl)  Past History:  Past Medical History: Last updated: 10/28/2009 HYPERTENSION  DIABETES MELLITUS, TYPE II, UNCONTROLLED  HYPERLIPIDEMIA OSTEOARTHRITIS    FIBROMYALGIA  RESTLESS LEG SYNDROME   Family History: father - deceased: colon cancer with mets, lung cancer mother - deceased @ 51: CVA, HTN Brother - HTN, CVA Brother - prostate cancer 2 sisters with breast cancer 1 sister with leukemia and type 1 dm, deceased at 26 1 sister with lupus brother with diabetes brother (83) and sister (1981), both had thyroid surgery.  Review of Systems  The patient denies hypoglycemia.    Physical Exam  General:  obese.  no distress  Lungs:  Clear to auscultation bilaterally. Normal respiratory effort.  Additional Exam:  Hemoglobin A1C       [H]  7.8 %    Impression & Recommendations:  Problem # 1:  DIABETES MELLITUS, TYPE II, UNCONTROLLED (ICD-250.02) Assessment Deteriorated worse due to decreasing effect of the actos  Problem # 2:  cough residual from uri new problem  Medications Added to Medication List This Visit: 1)  Promethazine-codeine 6.25-10 Mg/40ml Syrp (Promethazine-codeine) .Marland Kitchen.. 1 teaspoon every 4 hrs as needed for cough 2)  Bromocriptine Mesylate 2.5 Mg Tabs (Bromocriptine mesylate) .Marland Kitchen.. 1 tab once daily  Other Orders: TLB-A1C / Hgb A1C (Glycohemoglobin) (83036-A1C) Est. Patient Level IV (04540)  Patient Instructions: 1)  take cough syrup  as needed for cough 2)  blood tests are being ordered for you today.  please call [phone] to hear your test results. 3)  pending the test results, please continue the same medications for now. 4)  Please schedule a follow-up appointment in 3 months. 5)  (update: i left message on phone-tree:  add bromocriptine 2.5 mg at bedtime.  for the 1st week, take only 1/2 tab) Prescriptions: BROMOCRIPTINE MESYLATE 2.5 MG TABS (BROMOCRIPTINE MESYLATE) 1 tab once daily  #30 x 11   Entered and Authorized by:   Minus Breeding MD   Signed by:   Minus Breeding MD on 09/29/2010   Method used:   Electronically to        [facility] (retail)       [address]        [place], [place]  [address]       Ph: [phone]       Fax: [phone]   RxID:   [id number] PROMETHAZINE-CODEINE 6.25-10 MG/5ML SYRP (PROMETHAZINE-CODEINE) 1 teaspoon every 4 hrs as needed for cough  #8 oz x 1   Entered and Authorized by:   Minus Breeding MD   Signed by:   Minus Breeding MD on 09/29/2010   Method used:   Print then Give to Patient   RxID:   [id number]    Orders Added: 1)  TLB-A1C / Hgb A1C (Glycohemoglobin) [83036-A1C] 2)  Est. Patient Level IV [[address]]

## 2010-10-29 LAB — GLUCOSE, CAPILLARY
Glucose-Capillary: 135 mg/dL — ABNORMAL HIGH (ref 70–99)
Glucose-Capillary: 201 mg/dL — ABNORMAL HIGH (ref 70–99)

## 2010-12-28 ENCOUNTER — Ambulatory Visit (INDEPENDENT_AMBULATORY_CARE_PROVIDER_SITE_OTHER): Payer: Medicare Other | Admitting: Endocrinology

## 2010-12-28 ENCOUNTER — Encounter: Payer: Self-pay | Admitting: Endocrinology

## 2010-12-28 ENCOUNTER — Other Ambulatory Visit (INDEPENDENT_AMBULATORY_CARE_PROVIDER_SITE_OTHER): Payer: Medicare Other

## 2010-12-28 VITALS — BP 104/68 | HR 98 | Temp 98.6°F | Ht 63.0 in | Wt 189.6 lb

## 2010-12-28 DIAGNOSIS — IMO0001 Reserved for inherently not codable concepts without codable children: Secondary | ICD-10-CM

## 2010-12-28 LAB — HEMOGLOBIN A1C: Hgb A1c MFr Bld: 8.2 % — ABNORMAL HIGH (ref 4.6–6.5)

## 2010-12-28 NOTE — Patient Instructions (Addendum)
blood tests are being ordered for you today.  please call [phone] to hear your test results.  You will be prompted to enter the 9-digit "MRN" number that appears at the top left of this page, followed by #.  Then you will hear the message. Based on the results, i may advise you to increase the bromocriptine.  The other option is to take the insulin.  The best way to avoid low-blood sugar (the side effect of insulin) is to take the insulin 3x a day (just before each meal).  Please think it over. (update: i left message on phone-tree:  Pt was given humalog and novolog today).   Please call if you decide to take the insulin, which i what i advise.)

## 2010-12-28 NOTE — Progress Notes (Signed)
Subjective:    Patient ID: Carrie White, female    DOB: 01/11/1941, 70 y.o.   MRN: [id number]  HPI she brings a record of her cbg's which i have reviewed today.  It varies from 106-260, but most are in the 100's.  pt states she feels well in general, except for slight hair loss.   Past Medical History  Diagnosis Date  . GOITER, MULTINODULAR 07/01/2010  . DIABETES MELLITUS, TYPE II, UNCONTROLLED 08/01/2008  . HYPERLIPIDEMIA 07/31/2008  . INSOMNIA, CHRONIC 06/29/2009  . RESTLESS LEG SYNDROME 05/07/2007  . HYPERTENSION 08/01/2008  . OSTEOARTHRITIS 05/07/2007  . FIBROMYALGIA 05/07/2007    Past Surgical History  Procedure Date  . Abdominal hysterectomy   . Oophorectomy     History   Social History  . Marital Status: Married    Spouse Name: N/A    Number of Children: N/A  . Years of Education: N/A   Occupational History  . Not on file.   Social History Main Topics  . Smoking status: Former Games developer  . Smokeless tobacco: Not on file   Comment: quit in the 70's  . Alcohol Use: Not on file  . Drug Use: Not on file  . Sexually Active: Not on file   Other Topics Concern  . 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 acetaminophen-codeine (TYLENOL #3) 300-30 MG per tablet Take 1 tablet by mouth every morning.        . Glucose Blood (BAYER BREEZE 2 TEST) DISK Test two times a day as needed dx 250.02       . Insulin Pen Needle (BD ULTRA-FINE PEN NEEDLES) 29G X 12.7MM MISC 1 needle once daily       . lisinopril (PRINIVIL,ZESTRIL) 10 MG tablet Take 10 mg by mouth daily. For blood pressure and renal protection       . omeprazole (PRILOSEC) 40 MG capsule Take 40 mg by mouth daily.        . ondansetron (ZOFRAN) 4 MG tablet 1 every 4 hours as needed for nausea       . rOPINIRole (REQUIP) 3 MG tablet  Take 3 mg by mouth at bedtime.        . simvastatin (ZOCOR) 40 MG tablet Take 40 mg by mouth at bedtime.        Marland Kitchen zolpidem (AMBIEN) 10 MG tablet Take 10 mg by mouth at bedtime as needed.          Allergies  Allergen Reactions  . Pioglitazone     REACTION: weight gain    Family History  Problem Relation Age of Onset  . Hypertension Mother   . Stroke Mother     CVA  . Cancer Father     Colon Cancer with Mets, Lung Cancer  . Cancer Sister     Breast Cancer  . Hypertension Brother   . Stroke Brother     CVA  . Cancer Brother     Prostate Cancer  . Cancer Sister     Breast Cancer  . Leukemia Sister   . Diabetes Sister   . Lupus Sister   . Diabetes Brother   . Thyroid disease Other     Brother (1974) thyroid surgery  . Thyroid disease Other     Sister (1981) thyroid surgery  BP 104/68  Pulse 98  Temp(Src) 98.6 F (37 C) (Oral)  Ht 5\' 3"  (1.6 m)  Wt 189 lb 9.6 oz (86.002 kg)  BMI 33.59 kg/m2  SpO2 95%    Review of Systems denies hypoglycemia.    Objective:   Physical Exam GENERAL: no distress PSYCH: Alert and oriented x 3.  Does not appear anxious nor depressed.    Lab Results  Component Value Date   HGBA1C 8.2* 12/28/2010      Assessment & Plan:  Dm, needs increased rx.  Insulin is the best way to achieve good control

## 2010-12-29 ENCOUNTER — Telehealth: Payer: Self-pay

## 2010-12-29 NOTE — Telephone Encounter (Signed)
Stop victoza, bromocriptine, and metformin Start humalog 10 units tid (qac) Ret 2 weeks Call sooner if cbg stays over 200.

## 2010-12-29 NOTE — Telephone Encounter (Signed)
Pt called stating she reviewed phone tree message and per her Insurance company co pay for both Insulins are the same so pt is okay with whichever MD thinks would be best. Pt is requesting advisement on dosage, frequency and how often to test blood sugars? Please advise.

## 2010-12-30 NOTE — Telephone Encounter (Signed)
No answer, no VM

## 2010-12-31 MED ORDER — INSULIN LISPRO 100 UNIT/ML ~~LOC~~ SOLN: 10.0000 [IU] | Freq: Three times a day (TID) | SUBCUTANEOUS | Status: DC

## 2010-12-31 NOTE — Telephone Encounter (Signed)
Rx done. Pt informed. Will call and make 2-wk f/u appt.

## 2011-01-05 ENCOUNTER — Telehealth: Payer: Self-pay

## 2011-01-05 NOTE — Telephone Encounter (Signed)
2 questions:  Are you off the oral dm meds?  What time of day is it highest and lowest?

## 2011-01-05 NOTE — Telephone Encounter (Signed)
Per spouse, pt is no longer taking oral medication and her CBGs are highest in the mornings and lower in the afternoon (high 100)

## 2011-01-05 NOTE — Telephone Encounter (Signed)
Pt advised and transferred to schedule ROV 

## 2011-01-05 NOTE — Telephone Encounter (Signed)
Increase insulin to 15 with breakfast, 10 with lunch, and 15 with supper.  Ret next week.

## 2011-01-05 NOTE — Telephone Encounter (Signed)
Pt called stating she started insulin last Saturday and since then she has been experiencing fluctuations in blood sugars at different times of day, ranges from 102-275. Pt is requesting MD advisement, is medication going to take time to regulate CBGs?

## 2011-01-12 ENCOUNTER — Encounter: Payer: Self-pay | Admitting: Internal Medicine

## 2011-01-13 ENCOUNTER — Ambulatory Visit (INDEPENDENT_AMBULATORY_CARE_PROVIDER_SITE_OTHER): Payer: Medicare Other | Admitting: Endocrinology

## 2011-01-13 ENCOUNTER — Encounter: Payer: Self-pay | Admitting: Endocrinology

## 2011-01-13 ENCOUNTER — Ambulatory Visit: Payer: Medicare Other | Admitting: Internal Medicine

## 2011-01-13 DIAGNOSIS — IMO0001 Reserved for inherently not codable concepts without codable children: Secondary | ICD-10-CM

## 2011-01-13 NOTE — Patient Instructions (Addendum)
Increase humalog to 3x a day 15-10-25 units Please make a follow-up appointment in 3 weeks Please call sooner if your blood sugar goes below 80, or if it stays over 200 at bedtime.  It is important to also check blood sugar at bedtime.

## 2011-01-13 NOTE — Progress Notes (Signed)
Subjective:    Patient ID: Carrie White, female    DOB: 1940/10/10, 70 y.o.   MRN: [id number]  HPI pt states she feels well in general.  she brings a record of her cbg's which i have reviewed today.  It varies from 100's (afternoon) to mid-200's (hs, and in am).   Past Medical History  Diagnosis Date  . GOITER, MULTINODULAR 07/01/2010  . HYPERLIPIDEMIA 07/31/2008  . INSOMNIA, CHRONIC 06/29/2009  . RESTLESS LEG SYNDROME 05/07/2007  . HYPERTENSION 08/01/2008  . OSTEOARTHRITIS 05/07/2007  . FIBROMYALGIA 05/07/2007  . Diabetes mellitus     type II- uncontrolled  . Fibromyalgia     Past Surgical History  Procedure Date  . Abdominal hysterectomy   . Oophorectomy     History   Social History  . Marital Status: Married    Spouse Name: N/A    Number of Children: N/A  . Years of Education: N/A   Occupational History  . Not on file.   Social History Main Topics  . Smoking status: Former Games developer  . Smokeless tobacco: Not on file   Comment: quit in the 70's  . Alcohol Use: Not on file  . Drug Use: Not on file  . Sexually Active: Not on file   Other Topics Concern  . 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 acetaminophen-codeine (TYLENOL #3) 300-30 MG per tablet Take 1 tablet by mouth every morning.        . Glucose Blood (BAYER BREEZE 2 TEST) DISK Test two times a day as needed dx 250.02       . lisinopril (PRINIVIL,ZESTRIL) 10 MG tablet Take 10 mg by mouth daily. For blood pressure and renal protection       . omeprazole (PRILOSEC) 40 MG capsule Take 40 mg by mouth daily.        Marland Kitchen rOPINIRole (REQUIP) 3 MG tablet Take 3 mg by mouth at bedtime.        . simvastatin (ZOCOR) 40 MG tablet Take 40 mg by mouth at bedtime.        Marland Kitchen DISCONTD: insulin lispro (HUMALOG KWIKPEN) 100 UNIT/ML injection  Inject 10 Units into the skin 3 (three) times daily before meals.  3 mL  5  . DISCONTD: Insulin Pen Needle (BD ULTRA-FINE PEN NEEDLES) 29G X 12.7MM MISC 1 needle once daily       . DISCONTD: ondansetron (ZOFRAN) 4 MG tablet 1 every 4 hours as needed for nausea       . DISCONTD: zolpidem (AMBIEN) 10 MG tablet Take 10 mg by mouth at bedtime as needed.          Allergies  Allergen Reactions  . Pioglitazone     REACTION: weight gain    Family History  Problem Relation Age of Onset  . Hypertension Mother   . Stroke Mother     CVA  . Cancer Father     Colon Cancer with Mets, Lung Cancer  . Cancer Sister     Breast Cancer  . Hypertension Brother   . Stroke Brother     CVA  . Cancer Brother     Prostate Cancer  . Cancer Sister     Breast Cancer  . Leukemia Sister   . Diabetes Sister   . Lupus Sister   .  Diabetes Brother   . Thyroid disease Other     Brother (1974) thyroid surgery  . Thyroid disease Other     Sister (1981) thyroid surgery    BP 112/62  Pulse 95  Temp(Src) 98 F (36.7 C) (Oral)  Ht 5\' 3"  (1.6 m)  Wt 188 lb 9.6 oz (85.548 kg)  BMI 33.41 kg/m2  SpO2 97%    Review of Systems denies hypoglycemia    Objective:   Physical Exam GENERAL: no distress PSYCH: Alert and oriented x 3.  Does not appear anxious nor depressed.        Assessment & Plan:  Dm, needs increased rx.  The pattern of her cbg's indicates she needs more humalog with supper.  It is not clear if she needs basal insulin.

## 2011-01-17 ENCOUNTER — Other Ambulatory Visit: Payer: Self-pay | Admitting: Internal Medicine

## 2011-01-18 ENCOUNTER — Telehealth: Payer: Self-pay

## 2011-01-18 MED ORDER — INSULIN NPH (HUMAN) (ISOPHANE) 100 UNIT/ML ~~LOC~~ SUSP: 5.0000 [IU] | Freq: Every day | SUBCUTANEOUS | Status: DC

## 2011-01-18 NOTE — Telephone Encounter (Signed)
Pt states that pm number are her bedtime reading.

## 2011-01-18 NOTE — Telephone Encounter (Signed)
i need to know how cbg is at hs

## 2011-01-18 NOTE — Telephone Encounter (Signed)
Reduce humalog to 3x a day (just before each meal) 15-10-20 units Add nph 5 units qhs.  i sent rx Ret as scheduled.

## 2011-01-18 NOTE — Telephone Encounter (Signed)
Pt advised in detail 

## 2011-01-18 NOTE — Telephone Encounter (Signed)
Pt called to report blood sugars to MD 05/31- 185 in the pm 06/01 235 in the am, 220 in the pm 06/02 223 in the am, 99 in the pm 06/03 204 in the am, 78 in the pm 06/04 312 in the am, 118 in the pm 216 this morning

## 2011-01-19 ENCOUNTER — Telehealth: Payer: Self-pay

## 2011-01-19 NOTE — Telephone Encounter (Signed)
Abdomen, thighs, and upper arms are all ok.

## 2011-01-19 NOTE — Telephone Encounter (Signed)
Pt called requesting MD advise on other places on her body that she can inject Insulin. Pt is concerned since now she is injecting qid that she should alternate areas, please advise

## 2011-01-20 ENCOUNTER — Other Ambulatory Visit: Payer: Self-pay | Admitting: Internal Medicine

## 2011-01-20 NOTE — Telephone Encounter (Signed)
Pt advised.

## 2011-01-21 ENCOUNTER — Telehealth: Payer: Self-pay | Admitting: *Deleted

## 2011-01-21 MED ORDER — OMEPRAZOLE 40 MG PO CPDR: 40.0000 mg | DELAYED_RELEASE_CAPSULE | Freq: Every day | ORAL | Status: DC

## 2011-01-21 NOTE — Telephone Encounter (Signed)
refill 

## 2011-01-26 ENCOUNTER — Telehealth: Payer: Self-pay | Admitting: *Deleted

## 2011-01-26 MED ORDER — ACETAMINOPHEN-CODEINE #3 300-30 MG PO TABS: 1.0000 | ORAL_TABLET | ORAL | Status: DC

## 2011-01-26 NOTE — Telephone Encounter (Signed)
Patient requesting RF of tylenol #3

## 2011-01-26 NOTE — Telephone Encounter (Signed)
Ok x 3  

## 2011-02-01 ENCOUNTER — Encounter: Payer: Self-pay | Admitting: Endocrinology

## 2011-02-01 ENCOUNTER — Ambulatory Visit (INDEPENDENT_AMBULATORY_CARE_PROVIDER_SITE_OTHER): Payer: Medicare Other | Admitting: Endocrinology

## 2011-02-01 NOTE — Progress Notes (Signed)
Subjective:    Patient ID: Carrie White, female    DOB: [birth year], 70 y.o.   MRN: [id number]  HPI Pt reports weight gain, and alternating constipation/diarrhea.  She adjusted the insulin as per phone message.  she brings a record of her cbg's, checked in am, and at hs only, which i have reviewed today. It vries from 122-200.  It is in general higher at hs than in am.   Past Medical History  Diagnosis Date  . GOITER, MULTINODULAR 07/01/2010  . HYPERLIPIDEMIA 07/31/2008  . INSOMNIA, CHRONIC 06/29/2009  . RESTLESS LEG SYNDROME 05/07/2007  . HYPERTENSION 08/01/2008  . OSTEOARTHRITIS 05/07/2007  . FIBROMYALGIA 05/07/2007  . Diabetes mellitus     type II- uncontrolled  . Fibromyalgia     Past Surgical History  Procedure Date  . Abdominal hysterectomy   . Oophorectomy     History   Social History  . Marital Status: Married    Spouse Name: N/A    Number of Children: N/A  . Years of Education: N/A   Occupational History  . Not on file.   Social History Main Topics  . Smoking status: Former Games developer  . Smokeless tobacco: Not on file   Comment: quit in the 70's  . Alcohol Use: Not on file  . Drug Use: Not on file  . Sexually Active: Not on file   Other Topics Concern  . 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 acetaminophen-codeine (TYLENOL #3) 300-30 MG per tablet Take 1 tablet by mouth every morning.  30 tablet  3  . BAYER BREEZE 2 TEST DISK TEST TWO TIMES A DAY AS NEEDED  100 each  3  . insulin lispro (HUMALOG) 100 UNIT/ML injection Inject into the skin 3 (three) times daily before meals. 15-10-20 units      . lisinopril (PRINIVIL,ZESTRIL) 10 MG tablet 1 BY MOUTH ONCE DAILY FOR BLOOD PRESSURE AND RENAL PROTECTION.  90 tablet  1  . omeprazole (PRILOSEC) 40 MG capsule Take 1 capsule (40 mg  total) by mouth daily.  30 capsule  5  . rOPINIRole (REQUIP) 3 MG tablet TAKE 1 TABLET BY MOUTH AT BEDTIME  90 tablet  1  . simvastatin (ZOCOR) 40 MG tablet Take 40 mg by mouth at bedtime.        Marland Kitchen DISCONTD: insulin NPH (HUMULIN N) 100 UNIT/ML injection Inject 5 Units into the skin at bedtime.  10 mL  12    Allergies  Allergen Reactions  . Pioglitazone     REACTION: weight gain    Family History  Problem Relation Age of Onset  . Hypertension Mother   . Stroke Mother     CVA  . Cancer Father     Colon Cancer with Mets, Lung Cancer  . Cancer Sister     Breast Cancer  . Hypertension Brother   . Stroke Brother     CVA  . Cancer Brother     Prostate Cancer  . Cancer Sister     Breast Cancer  . Leukemia Sister   . Diabetes Sister   . Lupus Sister   . Diabetes Brother   . Thyroid disease Other     Brother (1974) thyroid surgery  . Thyroid disease Other     Sister (1981) thyroid surgery  BP 114/68  Pulse 92  Temp(Src) 98.5 F (36.9 C) (Oral)  Ht 5\' 3"  (1.6 m)  Wt 191 lb 3.2 oz (86.728 kg)  BMI 33.87 kg/m2  SpO2 98%  Review of Systems denies hypoglycemia.    Objective:   Physical Exam Pulses: dorsalis pedis intact bilat.   Feet: no deformity.  no ulcer on the feet.  feet are of normal color and temp.  no edema Neuro: sensation is intact to touch on the feet    Assessment & Plan:  Dm, needs increased rx

## 2011-02-01 NOTE — Patient Instructions (Addendum)
Continue the same humalog for now. Increase bedtime nph to 8 units. Please make a follow-up appointment in 6 weeks.   Please call sooner if your blood sugar goes below 80, or if it stays over 200 at bedtime.  It is important to also check blood sugar at bedtime. check your blood sugar 2 times a day.  vary the time of day when you check, between before the 3 meals, and at bedtime.  also check if you have symptoms of your blood sugar being too high or too low.  please keep a record of the readings and bring it to your next appointment here.  please call us sooner if you are having low blood sugar episodes.

## 2011-02-05 ENCOUNTER — Other Ambulatory Visit: Payer: Self-pay | Admitting: Endocrinology

## 2011-03-15 ENCOUNTER — Ambulatory Visit (INDEPENDENT_AMBULATORY_CARE_PROVIDER_SITE_OTHER): Payer: Medicare Other | Admitting: Endocrinology

## 2011-03-15 ENCOUNTER — Other Ambulatory Visit (INDEPENDENT_AMBULATORY_CARE_PROVIDER_SITE_OTHER): Payer: Medicare Other

## 2011-03-15 ENCOUNTER — Encounter: Payer: Self-pay | Admitting: Endocrinology

## 2011-03-15 VITALS — BP 122/72 | HR 86 | Temp 98.2°F | Ht 63.0 in | Wt 196.4 lb

## 2011-03-15 NOTE — Progress Notes (Signed)
Subjective:    Patient ID: Carrie White, female    DOB: October 23, 1940, 70 y.o.   MRN: [id number]  HPI pt states she feels well in general.  she brings a record of her cbg's which i have reviewed today.  It varies from 70 (after walking in the afternoon, which she cannot predict) to high-100's (am).   Past Medical History  Diagnosis Date  . GOITER, MULTINODULAR 07/01/2010  . HYPERLIPIDEMIA 07/31/2008  . INSOMNIA, CHRONIC 06/29/2009  . RESTLESS LEG SYNDROME 05/07/2007  . HYPERTENSION 08/01/2008  . OSTEOARTHRITIS 05/07/2007  . FIBROMYALGIA 05/07/2007  . Diabetes mellitus     type II- uncontrolled  . Fibromyalgia     Past Surgical History  Procedure Date  . Abdominal hysterectomy   . Oophorectomy     History   Social History  . Marital Status: Married    Spouse Name: N/A    Number of Children: N/A  . Years of Education: N/A   Occupational History  . Not on file.   Social History Main Topics  . Smoking status: Former Games developer  . Smokeless tobacco: Not on file   Comment: quit in the 70's  . Alcohol Use: Not on file  . Drug Use: Not on file  . Sexually Active: Not on file   Other Topics Concern  . 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 acetaminophen-codeine (TYLENOL #3) 300-30 MG per tablet Take 1 tablet by mouth every morning.  30 tablet  3  . B-D ULTRAFINE III SHORT PEN 31G X 8 MM MISC USE AS DIRECTED  100 each  3  . BAYER BREEZE 2 TEST DISK TEST TWO TIMES A DAY AS NEEDED  100 each  3  . insulin lispro (HUMALOG) 100 UNIT/ML injection Inject into the skin 3 (three) times daily before meals. 15-10-20 units      . insulin NPH (HUMULIN N,NOVOLIN N) 100 UNIT/ML injection Inject 10 Units into the skin at bedtime.       Marland Kitchen lisinopril (PRINIVIL,ZESTRIL) 10 MG tablet 1 BY MOUTH ONCE DAILY FOR  BLOOD PRESSURE AND RENAL PROTECTION.  90 tablet  1  . omeprazole (PRILOSEC) 40 MG capsule Take 1 capsule (40 mg total) by mouth daily.  30 capsule  5  . rOPINIRole (REQUIP) 3 MG tablet TAKE 1 TABLET BY MOUTH AT BEDTIME  90 tablet  1  . simvastatin (ZOCOR) 40 MG tablet Take 40 mg by mouth at bedtime.          Allergies  Allergen Reactions  . Pioglitazone     REACTION: weight gain    Family History  Problem Relation Age of Onset  . Hypertension Mother   . Stroke Mother     CVA  . Cancer Father     Colon Cancer with Mets, Lung Cancer  . Cancer Sister     Breast Cancer  . Hypertension Brother   . Stroke Brother     CVA  . Cancer Brother     Prostate Cancer  . Cancer Sister     Breast Cancer  . Leukemia Sister   . Diabetes Sister   . Lupus Sister   . Diabetes Brother   . Thyroid disease Other     Brother (1974) thyroid surgery  . Thyroid disease Other  Sister (1981) thyroid surgery    BP 122/72  Pulse 86  Temp(Src) 98.2 F (36.8 C) (Oral)  Ht 5\' 3"  (1.6 m)  Wt 196 lb 6.4 oz (89.086 kg)  BMI 34.79 kg/m2  SpO2 96%  Review of Systems denies hypoglycemia    Objective:   Physical Exam GENERAL: no distress SKIN: Insulin injection sites at the anterior abdomen are normal, except for a few ecchymoses.    Assessment & Plan:  Dm.  she needs some adjustment in her therapy

## 2011-03-15 NOTE — Patient Instructions (Addendum)
blood tests are being ordered for you today.  please call [phone] to hear your test results.  You will be prompted to enter the 9-digit "MRN" number that appears at the top left of this page, followed by #.  Then you will hear the message. pending the test results, please reduce humalog to (just before each meal) 15-8-20 units, and: Increase bedtime nph to 10 units. Please make a follow-up appointment in 3 months.   Please call sooner if your blood sugar goes below 80, or if it stays over 200 at bedtime.  It is important to also check blood sugar at bedtime. check your blood sugar 2 times a day.  vary the time of day when you check, between before the 3 meals, and at bedtime.  also check if you have symptoms of your blood sugar being too high or too low.  please keep a record of the readings and bring it to your next appointment here.  please call us sooner if you are having low blood sugar episodes.

## 2011-03-19 ENCOUNTER — Other Ambulatory Visit: Payer: Self-pay | Admitting: Internal Medicine

## 2011-05-26 ENCOUNTER — Telehealth: Payer: Self-pay | Admitting: *Deleted

## 2011-05-26 NOTE — Telephone Encounter (Signed)
Pharm is req RF of Tylenol #3, please advise

## 2011-05-26 NOTE — Telephone Encounter (Signed)
Ok for refill  x3 

## 2011-05-27 MED ORDER — ACETAMINOPHEN-CODEINE #3 300-30 MG PO TABS: 1.0000 | ORAL_TABLET | ORAL | Status: DC

## 2011-06-16 ENCOUNTER — Ambulatory Visit (INDEPENDENT_AMBULATORY_CARE_PROVIDER_SITE_OTHER): Payer: Medicare Other | Admitting: Endocrinology

## 2011-06-16 ENCOUNTER — Other Ambulatory Visit (INDEPENDENT_AMBULATORY_CARE_PROVIDER_SITE_OTHER): Payer: Medicare Other

## 2011-06-16 ENCOUNTER — Encounter: Payer: Self-pay | Admitting: Endocrinology

## 2011-06-16 VITALS — BP 126/64 | HR 78 | Temp 98.4°F | Ht 63.0 in | Wt 205.0 lb

## 2011-06-16 NOTE — Progress Notes (Signed)
Subjective:    Patient ID: Carrie White, female    DOB: 12-18-1940, 70 y.o.   MRN: [id number]  HPI pt states she feels well in general.  she brings a record of her cbg's which i have reviewed today.  It varies from 100-200, but most are in the low to mid-100's.  It is most consistently high in am.   Past Medical History  Diagnosis Date  . GOITER, MULTINODULAR 07/01/2010  . HYPERLIPIDEMIA 07/31/2008  . INSOMNIA, CHRONIC 06/29/2009  . RESTLESS LEG SYNDROME 05/07/2007  . HYPERTENSION 08/01/2008  . OSTEOARTHRITIS 05/07/2007  . FIBROMYALGIA 05/07/2007  . Diabetes mellitus     type II- uncontrolled  . Fibromyalgia     Past Surgical History  Procedure Date  . Abdominal hysterectomy   . Oophorectomy     History   Social History  . Marital Status: Married    Spouse Name: N/A    Number of Children: N/A  . Years of Education: N/A   Occupational History  . Not on file.   Social History Main Topics  . Smoking status: Former Games developer  . Smokeless tobacco: Not on file   Comment: quit in the 70's  . Alcohol Use: Not on file  . Drug Use: Not on file  . Sexually Active: Not on file   Other Topics Concern  . 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 acetaminophen-codeine (TYLENOL #3) 300-30 MG per tablet Take 1 tablet by mouth every morning.  30 tablet  3  . B-D ULTRAFINE III SHORT PEN 31G X 8 MM MISC USE AS DIRECTED  100 each  3  . BAYER BREEZE 2 TEST DISK TEST TWO TIMES A DAY AS NEEDED  100 each  3  . insulin lispro (HUMALOG) 100 UNIT/ML injection Inject into the skin 3 (three) times daily before meals. 15-10-20 units      . insulin NPH (HUMULIN N,NOVOLIN N) 100 UNIT/ML injection Inject 12 Units into the skin at bedtime.       Marland Kitchen lisinopril (PRINIVIL,ZESTRIL) 10 MG tablet 1 BY MOUTH ONCE DAILY FOR  BLOOD PRESSURE AND RENAL PROTECTION.  90 tablet  1  . omeprazole (PRILOSEC) 40 MG capsule Take 1 capsule (40 mg total) by mouth daily.  30 capsule  5  . rOPINIRole (REQUIP) 3 MG tablet TAKE 1 TABLET BY MOUTH AT BEDTIME  90 tablet  1  . simvastatin (ZOCOR) 40 MG tablet TAKE 1 TABLET BY MOUTH IN THE EVENING FOR CHOLESTEROL  30 tablet  5    Allergies  Allergen Reactions  . Pioglitazone     REACTION: weight gain    Family History  Problem Relation Age of Onset  . Hypertension Mother   . Stroke Mother     CVA  . Cancer Father     Colon Cancer with Mets, Lung Cancer  . Cancer Sister     Breast Cancer  . Hypertension Brother   . Stroke Brother     CVA  . Cancer Brother     Prostate Cancer  . Cancer Sister     Breast Cancer  . Leukemia Sister   . Diabetes Sister   . Lupus Sister   . Diabetes Brother   . Thyroid disease Other     Brother (1974) thyroid surgery  .  Thyroid disease Other     Sister (1981) thyroid surgery    BP 126/64  Pulse 78  Temp(Src) 98.4 F (36.9 C) (Oral)  Ht 5\' 3"  (1.6 m)  Wt 205 lb (92.987 kg)  BMI 36.31 kg/m2  SpO2 98%  Review of Systems denies hypoglycemia    Objective:   Physical Exam Pulses: dorsalis pedis intact bilat.   Feet: no deformity.  no ulcer on the feet.  feet are of normal color and temp.  no edema.  There is bilateral onychomycosis. Neuro: sensation is intact to touch on the feet    Lab Results  Component Value Date   HGBA1C 7.3* 06/16/2011       Assessment & Plan:  DM: she needs slightly increased rx

## 2011-06-16 NOTE — Patient Instructions (Addendum)
blood tests are being ordered for you today.  please call [phone] to hear your test results.  You will be prompted to enter the 9-digit "MRN" number that appears at the top left of this page, followed by #.  Then you will hear the message. pending the test results, please reduce humalog to (just before each meal) 15-10-20 units, and: Increase bedtime nph to 12 units.   Please make a follow-up appointment in 3 months.   Please call sooner if your blood sugar goes below 80, or if it stays over 200 at bedtime.  It is important to also check blood sugar at bedtime. check your blood sugar 2 times a day.  vary the time of day when you check, between before the 3 meals, and at bedtime.  also check if you have symptoms of your blood sugar being too high or too low.  please keep a record of the readings and bring it to your next appointment here.  please call us sooner if you are having low blood sugar episodes.  Refer to a weight-loss surgery specialist.  you will receive a phone call, about a day and time for an appointment for an informational.

## 2011-06-17 ENCOUNTER — Encounter: Payer: Self-pay | Admitting: Endocrinology

## 2011-06-23 ENCOUNTER — Other Ambulatory Visit: Payer: Self-pay | Admitting: Internal Medicine

## 2011-06-27 ENCOUNTER — Other Ambulatory Visit: Payer: Self-pay

## 2011-06-30 ENCOUNTER — Encounter: Payer: Self-pay | Admitting: Endocrinology

## 2011-06-30 ENCOUNTER — Encounter: Payer: Self-pay | Admitting: Internal Medicine

## 2011-07-12 ENCOUNTER — Ambulatory Visit (INDEPENDENT_AMBULATORY_CARE_PROVIDER_SITE_OTHER): Payer: Medicare Other | Admitting: Surgery

## 2011-07-12 ENCOUNTER — Encounter (INDEPENDENT_AMBULATORY_CARE_PROVIDER_SITE_OTHER): Payer: Self-pay | Admitting: Surgery

## 2011-07-12 VITALS — BP 144/88 | HR 80 | Temp 97.8°F | Resp 18 | Ht 62.5 in | Wt 209.8 lb

## 2011-07-12 DIAGNOSIS — D249 Benign neoplasm of unspecified breast: Secondary | ICD-10-CM | POA: Insufficient documentation

## 2011-07-12 HISTORY — DX: Benign neoplasm of unspecified breast: D24.9

## 2011-07-12 NOTE — Patient Instructions (Signed)
We will schedule surgery to remove the small abnormality in the left breast. This will be outpatient surgery and you can likely return to work in about two days

## 2011-07-12 NOTE — Progress Notes (Signed)
Patient ID: Carrie White, female   DOB: 01/13/1941, 70 y.o.   MRN: [id number]  Chief Complaint  Patient presents with  . Other    new pt- eval lt br mass    HPI Carrie White is a 70 y.o. female.  She had an area of calcifications in the left breast that was somewhat suspicious. A needle core biopsy has shown a papilloma. She comes over from her primary physician to discuss possible excision. HPI  Past Medical History  Diagnosis Date  . GOITER, MULTINODULAR 07/01/2010  . HYPERLIPIDEMIA 07/31/2008  . INSOMNIA, CHRONIC 06/29/2009  . RESTLESS LEG SYNDROME 05/07/2007  . HYPERTENSION 08/01/2008  . OSTEOARTHRITIS 05/07/2007  . FIBROMYALGIA 05/07/2007  . Diabetes mellitus     type II- uncontrolled  . Fibromyalgia   . 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 Abdominal hysterectomy   . Oophorectomy     Family History  Problem Relation Age of Onset  . Hypertension Mother   . Stroke Mother     CVA  . Cancer Father     Colon Cancer with Mets, Lung Cancer  . Cancer Sister     Breast Cancer  . Hypertension Brother   . Stroke Brother     CVA  . Cancer Brother     Prostate Cancer  . Cancer Sister     Breast Cancer  . Leukemia Sister   . Diabetes Sister   . Lupus Sister   . Diabetes Brother   . Thyroid disease Other     Brother (1974) thyroid surgery  . Thyroid disease Other     Sister (1981) thyroid surgery    Social History History  Substance Use Topics  . Smoking status: Former Smoker  . Smokeless tobacco: Not on file   Comment: quit in the 70's  . Alcohol Use: No    Allergies  Allergen Reactions  . Pioglitazone     REACTION: weight gain    Current Outpatient Prescriptions  Medication Sig Dispense Refill  . acetaminophen-codeine (TYLENOL #3) 300-30 MG per tablet Take 1 tablet by mouth every morning.  30 tablet  3  . B-D ULTRAFINE III SHORT PEN 31G X 8 MM MISC USE AS DIRECTED  100 each  3  . BAYER BREEZE 2 TEST  DISK TEST TWO TIMES A DAY AS NEEDED  100 each  11  . insulin lispro (HUMALOG) 100 UNIT/ML injection Inject into the skin 3 (three) times daily before meals. 15-10-20 units      . insulin NPH (HUMULIN N,NOVOLIN N) 100 UNIT/ML injection Inject 12 Units into the skin at bedtime.       . lisinopril (PRINIVIL,ZESTRIL) 10 MG tablet 1 BY MOUTH ONCE DAILY FOR BLOOD PRESSURE AND RENAL PROTECTION.  30 tablet  5  . omeprazole (PRILOSEC) 40 MG capsule TAKE ONE CAPSULE BY MOUTH EVERY DAY  30 capsule  5  . rOPINIRole (REQUIP) 3 MG tablet TAKE 1 TABLET BY MOUTH AT BEDTIME  30 tablet  5  . simvastatin (ZOCOR) 40 MG tablet TAKE 1 TABLET BY MOUTH IN THE EVENING FOR CHOLESTEROL  30 tablet  5    Review of Systems Review of Systems  Constitutional: Negative for fever, chills and unexpected weight change.  HENT: Negative for hearing loss, congestion, sore throat, trouble swallowing and voice change.   Eyes: Negative for visual disturbance.  Respiratory: Negative for cough and wheezing.   Cardiovascular: Negative for chest pain, palpitations and   leg swelling.  Gastrointestinal: Negative for nausea, vomiting, abdominal pain, diarrhea, constipation, blood in stool, abdominal distention and anal bleeding.  Genitourinary: Negative for hematuria, vaginal bleeding and difficulty urinating.  Musculoskeletal: Positive for arthralgias.  Skin: Negative for rash and wound.  Neurological: Negative for seizures, syncope and headaches.  Hematological: Negative for adenopathy. Does not bruise/bleed easily.  Psychiatric/Behavioral: Negative for confusion.    Blood pressure 144/88, pulse 80, temperature 97.8 F (36.6 C), temperature source Temporal, resp. rate 18, height 5' 2.5" (1.588 m), weight 209 lb 12.8 oz (95.165 kg).  Physical Exam Physical Exam  Constitutional: She is oriented to person, place, and time. She appears well-developed and well-nourished.  HENT:  Head: Normocephalic and atraumatic.  Eyes: Conjunctivae  and EOM are normal. No scleral icterus.  Neck: Normal range of motion. Neck supple. No tracheal deviation present. No thyromegaly present.  Cardiovascular: Normal rate, regular rhythm, normal heart sounds and intact distal pulses.  Exam reveals no friction rub.   No murmur heard. Pulmonary/Chest: Effort normal and breath sounds normal. No respiratory distress. She has no wheezes.  Abdominal: Soft. Bowel sounds are normal. She exhibits no distension. There is no tenderness.  Musculoskeletal: Normal range of motion.  Lymphadenopathy:    She has no cervical adenopathy.  Neurological: She is alert and oriented to person, place, and time.  Skin: Skin is warm and dry. No rash noted. No erythema.   Breasts symmetric and without lesion noted Data Reviewed Mammogram and path report as well as Epic records  Assessment    Intraductal papilloma left breast    Plan    NL Excision. I have discussed the indications for the lumpectomy and described the procedure. She understand that the chance of removal of the abnormal area is very good, but that occasionally we are unable to locate it and may have to do a second procedure. We also discussed the possibility of a second procedure to get additional tissue. Risks of surgery such as bleeding and infection have also been explained, as well as the implications of not doing the surgery. She understands and wishes to proceed.        Boubacar Lerette J 07/12/2011, 3:31 PM    

## 2011-07-14 ENCOUNTER — Encounter (HOSPITAL_BASED_OUTPATIENT_CLINIC_OR_DEPARTMENT_OTHER): Payer: Self-pay | Admitting: *Deleted

## 2011-07-14 NOTE — Progress Notes (Signed)
To come by for bmet,ekg Denies any resp -osa problems

## 2011-07-15 ENCOUNTER — Other Ambulatory Visit: Payer: Self-pay

## 2011-07-15 ENCOUNTER — Encounter (HOSPITAL_BASED_OUTPATIENT_CLINIC_OR_DEPARTMENT_OTHER)
Admission: RE | Admit: 2011-07-15 | Discharge: 2011-07-15 | Disposition: A | Discharge status: Home / Self Care | Payer: Medicare Other | Source: Ambulatory Visit | Attending: Surgery | Admitting: Surgery

## 2011-07-15 LAB — BASIC METABOLIC PANEL
BUN: 15 mg/dL (ref 6–23)
CO2: 31 mEq/L (ref 19–32)
Calcium: 9.3 mg/dL (ref 8.4–10.5)
Creatinine, Ser: 0.57 mg/dL (ref 0.50–1.10)

## 2011-07-18 ENCOUNTER — Encounter (HOSPITAL_BASED_OUTPATIENT_CLINIC_OR_DEPARTMENT_OTHER): Payer: Self-pay | Admitting: Anesthesiology

## 2011-07-18 ENCOUNTER — Encounter (HOSPITAL_BASED_OUTPATIENT_CLINIC_OR_DEPARTMENT_OTHER): Payer: Self-pay

## 2011-07-18 ENCOUNTER — Ambulatory Visit (HOSPITAL_BASED_OUTPATIENT_CLINIC_OR_DEPARTMENT_OTHER)
Admission: RE | Admit: 2011-07-18 | Discharge: 2011-07-18 | Disposition: A | Discharge status: Home / Self Care | Payer: Medicare Other | Source: Ambulatory Visit | Attending: Surgery | Admitting: Surgery

## 2011-07-18 ENCOUNTER — Ambulatory Visit (HOSPITAL_BASED_OUTPATIENT_CLINIC_OR_DEPARTMENT_OTHER): Payer: Medicare Other | Admitting: Anesthesiology

## 2011-07-18 ENCOUNTER — Other Ambulatory Visit (INDEPENDENT_AMBULATORY_CARE_PROVIDER_SITE_OTHER): Payer: Self-pay | Admitting: Surgery

## 2011-07-18 ENCOUNTER — Encounter (HOSPITAL_BASED_OUTPATIENT_CLINIC_OR_DEPARTMENT_OTHER)
Admission: RE | Disposition: A | Discharge status: Home / Self Care | Payer: Self-pay | Source: Ambulatory Visit | Attending: Surgery

## 2011-07-18 DIAGNOSIS — Z01810 Encounter for preprocedural cardiovascular examination: Secondary | ICD-10-CM | POA: Insufficient documentation

## 2011-07-18 DIAGNOSIS — E109 Type 1 diabetes mellitus without complications: Secondary | ICD-10-CM | POA: Insufficient documentation

## 2011-07-18 DIAGNOSIS — I10 Essential (primary) hypertension: Secondary | ICD-10-CM | POA: Insufficient documentation

## 2011-07-18 DIAGNOSIS — Z794 Long term (current) use of insulin: Secondary | ICD-10-CM | POA: Insufficient documentation

## 2011-07-18 DIAGNOSIS — D249 Benign neoplasm of unspecified breast: Secondary | ICD-10-CM | POA: Insufficient documentation

## 2011-07-18 HISTORY — PX: BREAST MASS EXCISION: SHX1267

## 2011-07-18 HISTORY — PX: MASTECTOMY, PARTIAL: SHX709

## 2011-07-18 LAB — GLUCOSE, CAPILLARY
Glucose-Capillary: 158 mg/dL — ABNORMAL HIGH (ref 70–99)
Glucose-Capillary: 174 mg/dL — ABNORMAL HIGH (ref 70–99)

## 2011-07-18 SURGERY — EXCISION OF BREAST BIOPSY
Anesthesia: General | Site: Breast | Laterality: Left | Wound class: Clean

## 2011-07-18 MED ORDER — PROMETHAZINE HCL 25 MG/ML IJ SOLN: 6.2500 mg | INTRAMUSCULAR | Status: DC | PRN

## 2011-07-18 MED ORDER — HYDROMORPHONE HCL 2 MG PO TABS: 2.0000 mg | ORAL_TABLET | ORAL | Status: AC | PRN

## 2011-07-18 MED ORDER — PROPOFOL 10 MG/ML IV BOLUS
INTRAVENOUS | Status: DC | PRN
  Administered 2011-07-18: 170 mg via INTRAVENOUS

## 2011-07-18 MED ORDER — FENTANYL CITRATE 0.05 MG/ML IJ SOLN
INTRAMUSCULAR | Status: DC | PRN
  Administered 2011-07-18: 50 ug via INTRAVENOUS
  Administered 2011-07-18: 100 ug via INTRAVENOUS
  Administered 2011-07-18: 50 ug via INTRAVENOUS

## 2011-07-18 MED ORDER — ACETAMINOPHEN 10 MG/ML IV SOLN
1000.0000 mg | Freq: Four times a day (QID) | INTRAVENOUS | Status: DC
  Administered 2011-07-18 (×2): 1000 mg via INTRAVENOUS

## 2011-07-18 MED ORDER — LACTATED RINGERS IV SOLN
INTRAVENOUS | Status: DC
  Administered 2011-07-18 (×2): via INTRAVENOUS

## 2011-07-18 MED ORDER — MIDAZOLAM HCL 5 MG/5ML IJ SOLN
INTRAMUSCULAR | Status: DC | PRN
  Administered 2011-07-18: 2 mg via INTRAVENOUS

## 2011-07-18 MED ORDER — MEPERIDINE HCL 25 MG/ML IJ SOLN: 6.2500 mg | INTRAMUSCULAR | Status: DC | PRN

## 2011-07-18 MED ORDER — EPHEDRINE SULFATE 50 MG/ML IJ SOLN
INTRAMUSCULAR | Status: DC | PRN
  Administered 2011-07-18: 10 mg via INTRAVENOUS
  Administered 2011-07-18: 5 mg via INTRAVENOUS
  Administered 2011-07-18: 10 mg via INTRAVENOUS

## 2011-07-18 MED ORDER — DROPERIDOL 2.5 MG/ML IJ SOLN
INTRAMUSCULAR | Status: DC | PRN
  Administered 2011-07-18: 0.625 mg via INTRAVENOUS

## 2011-07-18 MED ORDER — DEXAMETHASONE SODIUM PHOSPHATE 4 MG/ML IJ SOLN
INTRAMUSCULAR | Status: DC | PRN
  Administered 2011-07-18: 10 mg via INTRAVENOUS

## 2011-07-18 MED ORDER — ONDANSETRON HCL 4 MG/2ML IJ SOLN
INTRAMUSCULAR | Status: DC | PRN
  Administered 2011-07-18: 4 mg via INTRAVENOUS

## 2011-07-18 MED ORDER — CEFAZOLIN SODIUM-DEXTROSE 2-3 GM-% IV SOLR
2.0000 g | INTRAVENOUS | Status: AC
  Administered 2011-07-18: 2 g via INTRAVENOUS

## 2011-07-18 MED ORDER — HYDROMORPHONE HCL PF 1 MG/ML IJ SOLN: 0.2500 mg | INTRAMUSCULAR | Status: DC | PRN

## 2011-07-18 MED ORDER — BUPIVACAINE HCL (PF) 0.25 % IJ SOLN
INTRAMUSCULAR | Status: DC | PRN
  Administered 2011-07-18: 30 mL

## 2011-07-18 MED ORDER — SODIUM CHLORIDE 0.9 % IR SOLN
Status: DC | PRN
  Administered 2011-07-18: 500 mL

## 2011-07-18 SURGICAL SUPPLY — 52 items
ADH SKN CLS APL DERMABOND .7 (GAUZE/BANDAGES/DRESSINGS) ×1
APPLICATOR COTTON TIP 6IN STRL (MISCELLANEOUS) IMPLANT
BINDER BREAST LRG (GAUZE/BANDAGES/DRESSINGS) IMPLANT
BINDER BREAST MEDIUM (GAUZE/BANDAGES/DRESSINGS) IMPLANT
BINDER BREAST XLRG (GAUZE/BANDAGES/DRESSINGS) ×1 IMPLANT
BINDER BREAST XXLRG (GAUZE/BANDAGES/DRESSINGS) IMPLANT
BLADE HEX COATED 2.75 (ELECTRODE) ×2 IMPLANT
BLADE SURG 15 STRL LF DISP TIS (BLADE) ×1 IMPLANT
BLADE SURG 15 STRL SS (BLADE) ×2
CANISTER SUCTION 1200CC (MISCELLANEOUS) ×2 IMPLANT
CHLORAPREP W/TINT 26ML (MISCELLANEOUS) ×2 IMPLANT
CLIP TI MEDIUM 6 (CLIP) IMPLANT
CLIP TI WIDE RED SMALL 6 (CLIP) IMPLANT
CLOTH BEACON ORANGE TIMEOUT ST (SAFETY) ×2 IMPLANT
COVER MAYO STAND STRL (DRAPES) ×2 IMPLANT
COVER TABLE BACK 60X90 (DRAPES) ×2 IMPLANT
DECANTER SPIKE VIAL GLASS SM (MISCELLANEOUS) IMPLANT
DERMABOND ADVANCED (GAUZE/BANDAGES/DRESSINGS) ×1
DERMABOND ADVANCED .7 DNX12 (GAUZE/BANDAGES/DRESSINGS) ×1 IMPLANT
DEVICE DUBIN W/COMP PLATE 8390 (MISCELLANEOUS) ×1 IMPLANT
DRAPE LAPAROTOMY TRNSV 102X78 (DRAPE) ×2 IMPLANT
DRAPE UTILITY XL STRL (DRAPES) ×2 IMPLANT
ELECT REM PT RETURN 9FT ADLT (ELECTROSURGICAL) ×2
ELECTRODE REM PT RTRN 9FT ADLT (ELECTROSURGICAL) ×1 IMPLANT
GLOVE BIOGEL PI IND STRL 6.5 (GLOVE) IMPLANT
GLOVE BIOGEL PI IND STRL 7.0 (GLOVE) IMPLANT
GLOVE BIOGEL PI INDICATOR 6.5 (GLOVE) ×1
GLOVE BIOGEL PI INDICATOR 7.0 (GLOVE) ×1
GLOVE EUDERMIC 7 POWDERFREE (GLOVE) ×2 IMPLANT
GLOVE SKINSENSE NS SZ7.0 (GLOVE) ×1
GLOVE SKINSENSE STRL SZ7.0 (GLOVE) IMPLANT
GOWN PREVENTION PLUS XLARGE (GOWN DISPOSABLE) ×3 IMPLANT
GOWN PREVENTION PLUS XXLARGE (GOWN DISPOSABLE) ×1 IMPLANT
KIT MARKER MARGIN INK (KITS) ×1 IMPLANT
NDL HYPO 25X1 1.5 SAFETY (NEEDLE) ×1 IMPLANT
NEEDLE HYPO 25X1 1.5 SAFETY (NEEDLE) ×2 IMPLANT
NS IRRIG 1000ML POUR BTL (IV SOLUTION) ×1 IMPLANT
PACK BASIN DAY SURGERY FS (CUSTOM PROCEDURE TRAY) ×2 IMPLANT
PENCIL BUTTON HOLSTER BLD 10FT (ELECTRODE) ×2 IMPLANT
SLEEVE SCD COMPRESS KNEE MED (MISCELLANEOUS) ×2 IMPLANT
SPONGE GAUZE 4X4 12PLY (GAUZE/BANDAGES/DRESSINGS) IMPLANT
SPONGE INTESTINAL PEANUT (DISPOSABLE) IMPLANT
SPONGE LAP 4X18 X RAY DECT (DISPOSABLE) ×2 IMPLANT
STAPLER VISISTAT 35W (STAPLE) IMPLANT
SUT MNCRL AB 4-0 PS2 18 (SUTURE) ×2 IMPLANT
SUT SILK 0 TIES 10X30 (SUTURE) IMPLANT
SUT VICRYL 3-0 CR8 SH (SUTURE) ×2 IMPLANT
SYR CONTROL 10ML LL (SYRINGE) ×2 IMPLANT
TOWEL OR NON WOVEN STRL DISP B (DISPOSABLE) ×2 IMPLANT
TUBE CONNECTING 20X1/4 (TUBING) ×2 IMPLANT
WATER STERILE IRR 1000ML POUR (IV SOLUTION) ×1 IMPLANT
YANKAUER SUCT BULB TIP NO VENT (SUCTIONS) ×2 IMPLANT

## 2011-07-18 NOTE — Transfer of Care (Signed)
Immediate Anesthesia Transfer of Care Note  Patient: Carrie White  Procedure(s) Performed:  EXCISION OF BREAST BIOPSY - removal left breast mass   Patient Location: PACU  Anesthesia Type: General  Level of Consciousness: awake, alert  and oriented  Airway & Oxygen Therapy: Patient Spontanous Breathing and Patient connected to face mask oxygen  Post-op Assessment: Report given to PACU RN and Post -op Vital signs reviewed and stable  Post vital signs: Reviewed and stable  Complications: No apparent anesthesia complications

## 2011-07-18 NOTE — H&P (View-Only) (Signed)
Patient ID: Carrie White, female   DOB: [birth year], 70 y.o.   MRN: [id number]  Chief Complaint  Patient presents with  . Other    new pt- eval lt br mass    HPI Carrie White is a 70 y.o. female.  She had an area of calcifications in the left breast that was somewhat suspicious. A needle core biopsy has shown a papilloma. She comes over from her primary physician to discuss possible excision. HPI  Past Medical History  Diagnosis Date  . GOITER, MULTINODULAR 07/01/2010  . HYPERLIPIDEMIA 07/31/2008  . INSOMNIA, CHRONIC 06/29/2009  . RESTLESS LEG SYNDROME 05/07/2007  . HYPERTENSION 08/01/2008  . OSTEOARTHRITIS 05/07/2007  . FIBROMYALGIA 05/07/2007  . Diabetes mellitus     type II- uncontrolled  . Fibromyalgia   . 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 Abdominal hysterectomy   . Oophorectomy     Family History  Problem Relation Age of Onset  . Hypertension Mother   . Stroke Mother     CVA  . Cancer Father     Colon Cancer with Mets, Lung Cancer  . Cancer Sister     Breast Cancer  . Hypertension Brother   . Stroke Brother     CVA  . Cancer Brother     Prostate Cancer  . Cancer Sister     Breast Cancer  . Leukemia Sister   . Diabetes Sister   . Lupus Sister   . Diabetes Brother   . Thyroid disease Other     Brother (1974) thyroid surgery  . Thyroid disease Other     Sister (1981) thyroid surgery    Social History History  Substance Use Topics  . Smoking status: Former Games developer  . Smokeless tobacco: Not on file   Comment: quit in the 70's  . Alcohol Use: No    Allergies  Allergen Reactions  . Pioglitazone     REACTION: weight gain    Current Outpatient Prescriptions  Medication Sig Dispense Refill  . acetaminophen-codeine (TYLENOL #3) 300-30 MG per tablet Take 1 tablet by mouth every morning.  30 tablet  3  . B-D ULTRAFINE III SHORT PEN 31G X 8 MM MISC USE AS DIRECTED  100 each  3  . BAYER BREEZE 2 TEST  DISK TEST TWO TIMES A DAY AS NEEDED  100 each  11  . insulin lispro (HUMALOG) 100 UNIT/ML injection Inject into the skin 3 (three) times daily before meals. 15-10-20 units      . insulin NPH (HUMULIN N,NOVOLIN N) 100 UNIT/ML injection Inject 12 Units into the skin at bedtime.       Marland Kitchen lisinopril (PRINIVIL,ZESTRIL) 10 MG tablet 1 BY MOUTH ONCE DAILY FOR BLOOD PRESSURE AND RENAL PROTECTION.  30 tablet  5  . omeprazole (PRILOSEC) 40 MG capsule TAKE ONE CAPSULE BY MOUTH EVERY DAY  30 capsule  5  . rOPINIRole (REQUIP) 3 MG tablet TAKE 1 TABLET BY MOUTH AT BEDTIME  30 tablet  5  . simvastatin (ZOCOR) 40 MG tablet TAKE 1 TABLET BY MOUTH IN THE EVENING FOR CHOLESTEROL  30 tablet  5    Review of Systems Review of Systems  Constitutional: Negative for fever, chills and unexpected weight change.  HENT: Negative for hearing loss, congestion, sore throat, trouble swallowing and voice change.   Eyes: Negative for visual disturbance.  Respiratory: Negative for cough and wheezing.   Cardiovascular: Negative for chest pain, palpitations and  leg swelling.  Gastrointestinal: Negative for nausea, vomiting, abdominal pain, diarrhea, constipation, blood in stool, abdominal distention and anal bleeding.  Genitourinary: Negative for hematuria, vaginal bleeding and difficulty urinating.  Musculoskeletal: Positive for arthralgias.  Skin: Negative for rash and wound.  Neurological: Negative for seizures, syncope and headaches.  Hematological: Negative for adenopathy. Does not bruise/bleed easily.  Psychiatric/Behavioral: Negative for confusion.    Blood pressure 144/88, pulse 80, temperature 97.8 F (36.6 C), temperature source Temporal, resp. rate 18, height 5' 2.5" (1.588 m), weight 209 lb 12.8 oz (95.165 kg).  Physical Exam Physical Exam  Constitutional: She is oriented to person, place, and time. She appears well-developed and well-nourished.  HENT:  Head: Normocephalic and atraumatic.  Eyes: Conjunctivae  and EOM are normal. No scleral icterus.  Neck: Normal range of motion. Neck supple. No tracheal deviation present. No thyromegaly present.  Cardiovascular: Normal rate, regular rhythm, normal heart sounds and intact distal pulses.  Exam reveals no friction rub.   No murmur heard. Pulmonary/Chest: Effort normal and breath sounds normal. No respiratory distress. She has no wheezes.  Abdominal: Soft. Bowel sounds are normal. She exhibits no distension. There is no tenderness.  Musculoskeletal: Normal range of motion.  Lymphadenopathy:    She has no cervical adenopathy.  Neurological: She is alert and oriented to person, place, and time.  Skin: Skin is warm and dry. No rash noted. No erythema.   Breasts symmetric and without lesion noted Data Reviewed Mammogram and path report as well as Epic records  Assessment    Intraductal papilloma left breast    Plan    NL Excision. I have discussed the indications for the lumpectomy and described the procedure. She understand that the chance of removal of the abnormal area is very good, but that occasionally we are unable to locate it and may have to do a second procedure. We also discussed the possibility of a second procedure to get additional tissue. Risks of surgery such as bleeding and infection have also been explained, as well as the implications of not doing the surgery. She understands and wishes to proceed.        [name] J 07/12/2011, 3:31 PM

## 2011-07-18 NOTE — Interval H&P Note (Signed)
History and Physical Interval Note:  07/18/2011 11:52 AM  Carrie White  has presented today for surgery, with the diagnosis of Papilloma of breast  The various methods of treatment have been discussed with the patient and family. After consideration of risks, benefits and other options for treatment, the patient has consented to  Procedure(s): EXCISION OF BREAST BIOPSY as a surgical intervention .  The patients' history has been reviewed, patient examined, no change in status, stable for surgery.  I have reviewed the patients' chart and labs.  Questions were answered to the patient's satisfaction.     Ameia Morency J

## 2011-07-18 NOTE — Anesthesia Preprocedure Evaluation (Addendum)
Anesthesia Evaluation  Patient identified by MRN, date of birth, ID band Patient awake    Reviewed: Allergy & Precautions, H&P , NPO status , Patient's Chart, lab work & pertinent test results  Airway Mallampati: II TM Distance: >3 FB Neck ROM: full    Dental No notable dental hx. (+) Teeth Intact   Pulmonary neg pulmonary ROS,  clear to auscultation  Pulmonary exam normal       Cardiovascular hypertension, On Medications regular Normal    Neuro/Psych Negative Neurological ROS  Negative Psych ROS   GI/Hepatic Neg liver ROS, Medicated and Controlled,  Endo/Other  Well Controlled, Type 1, Insulin Dependent  Renal/GU negative Renal ROS  Genitourinary negative   Musculoskeletal   Abdominal   Peds  Hematology negative hematology ROS (+)   Anesthesia Other Findings   Reproductive/Obstetrics negative OB ROS                           Anesthesia Physical Anesthesia Plan  ASA: III  Anesthesia Plan: General   Post-op Pain Management:    Induction: Intravenous  Airway Management Planned: LMA  Additional Equipment:   Intra-op Plan:   Post-operative Plan: Extubation in OR  Informed Consent: I have reviewed the patients History and Physical, chart, labs and discussed the procedure including the risks, benefits and alternatives for the proposed anesthesia with the patient or authorized representative who has indicated his/her understanding and acceptance.     Plan Discussed with: CRNA  Anesthesia Plan Comments:         Anesthesia Quick Evaluation

## 2011-07-18 NOTE — Anesthesia Postprocedure Evaluation (Signed)
  Anesthesia Post-op Note  Patient: Carrie White  Procedure(s) Performed:  EXCISION OF BREAST BIOPSY - removal left breast mass   Patient Location: PACU  Anesthesia Type: General  Level of Consciousness: sedated  Airway and Oxygen Therapy: Patient Spontanous Breathing and Patient connected to face mask oxygen  Post-op Pain: none  Post-op Assessment: Post-op Vital signs reviewed, Patient's Cardiovascular Status Stable, Respiratory Function Stable, Patent Airway and No signs of Nausea or vomiting  Post-op Vital Signs: Reviewed and stable  Complications: No apparent anesthesia complications

## 2011-07-18 NOTE — Anesthesia Procedure Notes (Signed)
Procedure Name: LMA Insertion Date/Time: 07/18/2011 12:37 PM Performed by: Gladys Damme Pre-anesthesia Checklist: Patient identified, Timeout performed, Emergency Drugs available, Suction available and Patient being monitored Patient Re-evaluated:Patient Re-evaluated prior to inductionOxygen Delivery Method: Circle System Utilized Preoxygenation: Pre-oxygenation with 100% oxygen Intubation Type: IV induction LMA: LMA with gastric port inserted LMA Size: 4.0 Number of attempts: 1 Placement Confirmation: breath sounds checked- equal and bilateral and positive ETCO2 Tube secured with: Tape Dental Injury: Teeth and Oropharynx as per pre-operative assessment

## 2011-07-18 NOTE — Op Note (Signed)
Carrie White  August 23, 1940  [id number]  07/18/2011   Preoperative diagnosis: breast mass probable intraductal papilloma by core biopsy  Postoperative diagnosis: the same  Procedure: meal guided excision left breast mass  Surgeon: Currie Paris, MD, FACS  Anesthesia: General  Clinical History and Indications: this patient had an abnormality with some calcifications first noted and a biopsy done. This is inconclusive but appeared to be a papilloma. After discussion with the patient we elected to proceed with a needle guided excisional biopsy the patient understood and wished to proceed. Description of Procedure:I saw the patient in the holding area and reviewed the plans for the procedure and again. I initial the left breast as the operative side. I reviewed the wire localizing films show the guidewire and medial and track laterally with the abnormality being below the nipple areolar complex.  The patient was taken to the operating room and after satisfactory general (LIMA) anesthesia had been obtained the left breast was prepped and draped in a time out was done.  I made an incision transversely oriented directly over the past the guidewire and centered on the medial aspect of the areolar margin. I divided about 1 cm of breast tissue and then elevated the skin medially until I could manipulate the guidewire into the wound. Then using Allis clamps I took a wide cylinder of tissue around the guidewire going into the depths of the breast until I was beyond the tip. I felt I had adequately removed the area of abnormality.  The specimen mammogram was done showing the clip and the calcifications to be in the specimen. This was reviewed by the radiologist.  I infiltrated approximately 30 cc of 0.25% plain Marcaine to help with postop analgesia. I irrigated and made sure everything was dry.  Incision was closed in layers with 3-0 Vicryl and 4-0 Monocryl subcuticular with Dermabond applied to  the skin.  The patient tolerated the procedure well. There were no operative complications. All counts were correct.   EBL: minimal  Currie Paris, MD, FACS 07/18/2011 1:20 PM

## 2011-07-19 ENCOUNTER — Encounter: Payer: Self-pay | Admitting: Internal Medicine

## 2011-07-20 ENCOUNTER — Telehealth (INDEPENDENT_AMBULATORY_CARE_PROVIDER_SITE_OTHER): Payer: Self-pay | Admitting: General Surgery

## 2011-07-20 NOTE — Telephone Encounter (Signed)
Patient aware pathology is benign. Appt made for follow up on 08/05/11.

## 2011-07-20 NOTE — Telephone Encounter (Signed)
Message copied by Liliana Cline on Wed Jul 20, 2011  4:02 PM ------      Message from: Currie Paris      Created: Wed Jul 20, 2011  3:15 PM       Tell her path is benign and as expected

## 2011-07-29 ENCOUNTER — Encounter (INDEPENDENT_AMBULATORY_CARE_PROVIDER_SITE_OTHER): Payer: Self-pay | Admitting: Surgery

## 2011-08-05 ENCOUNTER — Encounter (INDEPENDENT_AMBULATORY_CARE_PROVIDER_SITE_OTHER): Payer: Self-pay | Admitting: Surgery

## 2011-08-05 ENCOUNTER — Ambulatory Visit (INDEPENDENT_AMBULATORY_CARE_PROVIDER_SITE_OTHER): Payer: Medicare Other | Admitting: Surgery

## 2011-08-05 VITALS — BP 128/76 | HR 84 | Temp 97.4°F | Resp 16 | Ht 62.5 in | Wt 209.0 lb

## 2011-08-05 DIAGNOSIS — D249 Benign neoplasm of unspecified breast: Secondary | ICD-10-CM

## 2011-08-05 DIAGNOSIS — Z9889 Other specified postprocedural states: Secondary | ICD-10-CM

## 2011-08-05 NOTE — Progress Notes (Signed)
NAME: Carrie White                                            DOB: December 10, 1940 DATE: 08/05/2011                                                  MRN: [id number]  CC: Post op   HPI: This patient comes in for post op follow-up.Sheunderwent removal of a left breast mass on 07/18/11. She feels that she is doing well.  PE: General: The patient appears to be healthy, NAD The incision is healing nicely and no evidence of infection or problem  DATA REVIEWED: Path confirmed an intraductal papilloma  IMPRESSION: The patient is doing well S/P Removal left breast intraductal papilloma.    PLAN: RTC PRN

## 2011-08-05 NOTE — Patient Instructions (Signed)
We will see you again on an as needed basis. Please call the office at [phone] if you have any questions or concerns. Thank you for allowing us to take care of you.  

## 2011-08-15 ENCOUNTER — Other Ambulatory Visit: Payer: Self-pay | Admitting: Endocrinology

## 2011-09-12 ENCOUNTER — Other Ambulatory Visit: Payer: Self-pay | Admitting: Internal Medicine

## 2011-09-20 ENCOUNTER — Other Ambulatory Visit: Payer: Self-pay | Admitting: *Deleted

## 2011-09-20 NOTE — Telephone Encounter (Signed)
Received fax for renewal on Tylenol # 3. Is this ok to refill?

## 2011-09-21 MED ORDER — ACETAMINOPHEN-CODEINE #3 300-30 MG PO TABS: 1.0000 | ORAL_TABLET | ORAL | Status: DC

## 2011-09-21 NOTE — Telephone Encounter (Signed)
Addended by: Deatra James on: 09/21/2011 10:31 AM   Modules accepted: Orders

## 2011-09-21 NOTE — Telephone Encounter (Signed)
Faxed script back to [facility] @ [phone].... 09/21/11@10 :31am/LMB

## 2011-09-22 ENCOUNTER — Encounter: Payer: Self-pay | Admitting: Endocrinology

## 2011-09-22 ENCOUNTER — Ambulatory Visit (INDEPENDENT_AMBULATORY_CARE_PROVIDER_SITE_OTHER): Payer: Medicare Other | Admitting: Endocrinology

## 2011-09-22 VITALS — BP 110/64 | HR 86 | Temp 97.0°F | Ht 63.0 in | Wt 210.4 lb

## 2011-09-22 NOTE — Patient Instructions (Addendum)
blood tests are being ordered for you today.  please call [phone] to hear your test results.  You will be prompted to enter the 9-digit "MRN" number that appears at the top left of this page, followed by #.  Then you will hear the message. pending the test results, please increase humalog to (just before each meal) 18-13-23 units, and: Increase bedtime nph to 15 units.   Please make a follow-up appointment in 2 weeks.   Please call sooner if your blood sugar goes below 80, or if it stays over 200 at bedtime.  It is important to also check blood sugar at bedtime. check your blood sugar 2 times a day.  vary the time of day when you check, between before the 3 meals, and at bedtime.  also check if you have symptoms of your blood sugar being too high or too low.  please keep a record of the readings and bring it to your next appointment here.  please call us sooner if you are having low blood sugar episodes.

## 2011-09-22 NOTE — Progress Notes (Signed)
Subjective:    Patient ID: Carrie White, female    DOB: 10-10-1940, 71 y.o.   MRN: [id number]  HPI Pt returns for f/u of insulin-requiring DM (2008).  She is uncertain as to why cbg's are higher over the past month or so, but has had breast surgery, and a respiratory illness.  she brings a record of her cbg's which i have reviewed today.  It varies from 160-300.  There is no trend throughout the day.  She reports weight gain.   Past Medical History  Diagnosis Date  . GOITER, MULTINODULAR 07/01/2010  . HYPERLIPIDEMIA 07/31/2008  . INSOMNIA, CHRONIC 06/29/2009  . RESTLESS LEG SYNDROME 05/07/2007  . HYPERTENSION 08/01/2008  . OSTEOARTHRITIS 05/07/2007  . FIBROMYALGIA 05/07/2007  . Diabetes mellitus     type II- uncontrolled  . Fibromyalgia   . 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 Abdominal hysterectomy   . Oophorectomy   . Mouth surgery [id number]  . Colonoscopy   . Dilation and curettage of uterus 1972  . Breast mass excision 07/18/11    left breast  . Mastectomy, partial 07/18/2011    History   Social History  . Marital Status: Married    Spouse Name: N/A    Number of Children: N/A  . Years of Education: N/A   Occupational History  . Not on file.   Social History Main Topics  . Smoking status: Former Smoker    Quit date: 07/13/1969  . Smokeless tobacco: Not on file   Comment: quit in the 70's  . Alcohol Use: No  . Drug Use: No  . Sexually Active: Not on file   Other Topics Concern  . 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 acetaminophen-codeine (TYLENOL #3) 300-30 MG per tablet Take 1 tablet by mouth every morning.  30 tablet  3  . B-D ULTRAFINE III SHORT PEN 31G X 8 MM MISC USE AS DIRECTED  100 each  3  . BAYER BREEZE 2 TEST  DISK TEST TWO TIMES A DAY AS NEEDED  100 each  11  . insulin lispro (HUMALOG) 100 UNIT/ML injection Inject into the skin 3 times daily before meals 18-13-23 units      . insulin NPH (HUMULIN N,NOVOLIN N) 100 UNIT/ML injection Inject 15 Units into the skin at bedtime.       Marland Kitchen lisinopril (PRINIVIL,ZESTRIL) 10 MG tablet 1 BY MOUTH ONCE DAILY FOR BLOOD PRESSURE AND RENAL PROTECTION.  30 tablet  5  . naproxen sodium (ANAPROX) 220 MG tablet Take 220 mg by mouth 2 (two) times daily with a meal.        . omeprazole (PRILOSEC) 40 MG capsule TAKE ONE CAPSULE BY MOUTH EVERY DAY  30 capsule  5  . rOPINIRole (REQUIP) 3 MG tablet TAKE 1 TABLET BY MOUTH AT BEDTIME  30 tablet  5  . simvastatin (ZOCOR) 40 MG tablet TAKE 1 TABLET BY MOUTH IN THE EVENING FOR CHOLESTEROL  30 tablet  5    Allergies  Allergen Reactions  . Pioglitazone     REACTION: weight gain    Family History  Problem Relation Age of Onset  . Hypertension Mother   . Stroke Mother     CVA  . Cancer Father  Colon Cancer with Mets, Lung Cancer  . Cancer Sister     Breast Cancer  . Hypertension Brother   . Stroke Brother     CVA  . Cancer Brother     Prostate Cancer  . Cancer Sister     Breast Cancer  . Leukemia Sister   . Diabetes Sister   . Lupus Sister   . Diabetes Brother   . Thyroid disease Other     Brother (1974) thyroid surgery  . Thyroid disease Other     Sister (1981) thyroid surgery    BP 110/64  Pulse 86  Temp(Src) 97 F (36.1 C) (Oral)  Ht 5\' 3"  (1.6 m)  Wt 210 lb 6.4 oz (95.437 kg)  BMI 37.27 kg/m2  SpO2 95%  Review of Systems denies hypoglycemia    Objective:   Physical Exam VITAL SIGNS:  See vs page GENERAL: no distress SKIN:  Insulin injection sites at the anterior abdomen are normal, except for a few ecchymoses.        Assessment & Plan:  DM, needs increased rx

## 2011-10-07 ENCOUNTER — Other Ambulatory Visit (INDEPENDENT_AMBULATORY_CARE_PROVIDER_SITE_OTHER): Payer: Medicare Other

## 2011-10-07 ENCOUNTER — Encounter: Payer: Self-pay | Admitting: Endocrinology

## 2011-10-07 ENCOUNTER — Ambulatory Visit (INDEPENDENT_AMBULATORY_CARE_PROVIDER_SITE_OTHER): Payer: Medicare Other | Admitting: Endocrinology

## 2011-10-07 DIAGNOSIS — IMO0001 Reserved for inherently not codable concepts without codable children: Secondary | ICD-10-CM

## 2011-10-07 NOTE — Patient Instructions (Addendum)
blood tests are being ordered for you today.  please call [phone] to hear your test results.  You will be prompted to enter the 9-digit "MRN" number that appears at the top left of this page, followed by #.  Then you will hear the message. pending the test results, please increase humalog to (just before each meal) 23-13-23 units, and: Increase bedtime nph to 20 units.   Please make a follow-up appointment in 1 month.    Please call sooner if your blood sugar goes below 80, or if it stays over 200 at bedtime.  It is important to also check blood sugar at bedtime. check your blood sugar 2 times a day.  vary the time of day when you check, between before the 3 meals, and at bedtime.  also check if you have symptoms of your blood sugar being too high or too low.  please keep a record of the readings and bring it to your next appointment here.  please call us sooner if you are having low blood sugar episodes.   (update: i left message on phone-tree:  rx as we discussed)

## 2011-10-07 NOTE — Progress Notes (Signed)
Subjective:    Patient ID: Carrie White, female    DOB: 01-27-1941, 71 y.o.   MRN: [id number]  HPI Pt returns for f/u of insulin-requiring DM (2008).  she brings a record of her cbg's which i have reviewed today.  It varies from 74-300.  It is highest in am.  pt states she feels well in general. Past Medical History  Diagnosis Date  . GOITER, MULTINODULAR 07/01/2010  . HYPERLIPIDEMIA 07/31/2008  . INSOMNIA, CHRONIC 06/29/2009  . RESTLESS LEG SYNDROME 05/07/2007  . HYPERTENSION 08/01/2008  . OSTEOARTHRITIS 05/07/2007  . FIBROMYALGIA 05/07/2007  . Diabetes mellitus     type II- uncontrolled  . Fibromyalgia   . 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 Abdominal hysterectomy   . Oophorectomy   . Mouth surgery 2130,8657  . Colonoscopy   . Dilation and curettage of uterus 1972  . Breast mass excision 07/18/11    left breast  . Mastectomy, partial 07/18/2011    History   Social History  . Marital Status: Married    Spouse Name: N/A    Number of Children: N/A  . Years of Education: N/A   Occupational History  . Not on file.   Social History Main Topics  . Smoking status: Former Smoker    Quit date: 07/13/1969  . Smokeless tobacco: Not on file   Comment: quit in the 70's  . Alcohol Use: No  . Drug Use: No  . Sexually Active: Not on file   Other Topics Concern  . 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 acetaminophen-codeine (TYLENOL #3) 300-30 MG per tablet Take 1 tablet by mouth every morning.  30 tablet  3  . B-D ULTRAFINE III SHORT PEN 31G X 8 MM MISC USE AS DIRECTED  100 each  3  . BAYER BREEZE 2 TEST DISK TEST TWO TIMES A DAY AS NEEDED  100 each  11  . insulin lispro (HUMALOG) 100 UNIT/ML injection Inject into the skin 3 times  daily before meals 23-13-23 units      . insulin NPH (HUMULIN N,NOVOLIN N) 100 UNIT/ML injection Inject 20 Units into the skin at bedtime.       Marland Kitchen lisinopril (PRINIVIL,ZESTRIL) 10 MG tablet 1 BY MOUTH ONCE DAILY FOR BLOOD PRESSURE AND RENAL PROTECTION.  30 tablet  5  . naproxen sodium (ANAPROX) 220 MG tablet Take 220 mg by mouth 2 (two) times daily with a meal.        . omeprazole (PRILOSEC) 40 MG capsule TAKE ONE CAPSULE BY MOUTH EVERY DAY  30 capsule  5  . rOPINIRole (REQUIP) 3 MG tablet TAKE 1 TABLET BY MOUTH AT BEDTIME  30 tablet  5  . simvastatin (ZOCOR) 40 MG tablet TAKE 1 TABLET BY MOUTH IN THE EVENING FOR CHOLESTEROL  30 tablet  5    Allergies  Allergen Reactions  . Pioglitazone     REACTION: weight gain    Family History  Problem Relation Age of Onset  . Hypertension Mother   . Stroke Mother     CVA  . Cancer Father     Colon Cancer with Mets, Lung Cancer  . Cancer Sister     Breast Cancer  . Hypertension Brother   .  Stroke Brother     CVA  . Cancer Brother     Prostate Cancer  . Cancer Sister     Breast Cancer  . Leukemia Sister   . Diabetes Sister   . Lupus Sister   . Diabetes Brother   . Thyroid disease Other     Brother (1974) thyroid surgery  . Thyroid disease Other     Sister (1981) thyroid surgery    BP 128/82  Pulse 88  Temp(Src) 98.2 F (36.8 C) (Oral)  Ht 5\' 3"  (1.6 m)  Wt 209 lb (94.802 kg)  BMI 37.02 kg/m2  SpO2 97%    Review of Systems denies hypoglycemia    Objective:   Physical Exam VITAL SIGNS:  See vs page GENERAL: no distress PSYCH: Alert and oriented x 3.  Does not appear anxious nor depressed.    Lab Results  Component Value Date   HGBA1C 8.5* 10/07/2011      Assessment & Plan:  DM: needs increased rx

## 2011-10-20 ENCOUNTER — Other Ambulatory Visit: Payer: Self-pay | Admitting: Internal Medicine

## 2011-11-08 ENCOUNTER — Encounter: Payer: Self-pay | Admitting: Endocrinology

## 2011-11-08 ENCOUNTER — Ambulatory Visit (INDEPENDENT_AMBULATORY_CARE_PROVIDER_SITE_OTHER): Payer: Medicare Other | Admitting: Endocrinology

## 2011-11-08 VITALS — BP 124/62 | HR 82 | Temp 97.3°F | Ht 63.0 in | Wt 211.2 lb

## 2011-11-08 NOTE — Progress Notes (Signed)
Subjective:    Patient ID: Carrie White, female    DOB: 1941/04/09, 71 y.o.   MRN: [id number]  HPI Pt returns for f/u of insulin-requiring DM (2008).  she brings a record of her cbg's which i have reviewed today.  It varies from 56 (with exertion) up to mid-100's (am).   She does not eat hs-snack.  She says she can't predict activity.   Past Medical History  Diagnosis Date  . GOITER, MULTINODULAR 07/01/2010  . HYPERLIPIDEMIA 07/31/2008  . INSOMNIA, CHRONIC 06/29/2009  . RESTLESS LEG SYNDROME 05/07/2007  . HYPERTENSION 08/01/2008  . OSTEOARTHRITIS 05/07/2007  . FIBROMYALGIA 05/07/2007  . Diabetes mellitus     type II- uncontrolled  . Fibromyalgia   . 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 Abdominal hysterectomy   . Oophorectomy   . Mouth surgery 9147,8295  . Colonoscopy   . Dilation and curettage of uterus 1972  . Breast mass excision 07/18/11    left breast  . Mastectomy, partial 07/18/2011    History   Social History  . Marital Status: Married    Spouse Name: N/A    Number of Children: N/A  . Years of Education: N/A   Occupational History  . Not on file.   Social History Main Topics  . Smoking status: Former Smoker    Quit date: 07/13/1969  . Smokeless tobacco: Not on file   Comment: quit in the 70's  . Alcohol Use: No  . Drug Use: No  . Sexually Active: Not on file   Other Topics Concern  . 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 acetaminophen-codeine (TYLENOL #3) 300-30 MG per tablet Take 1 tablet by mouth every morning.  30 tablet  3  . B-D ULTRAFINE III SHORT PEN 31G X 8 MM MISC USE AS DIRECTED  100 each  3  . BAYER BREEZE 2 TEST DISK TEST TWO TIMES A DAY AS NEEDED  100 each  11  . insulin lispro (HUMALOG) 100 UNIT/ML  injection Inject into the skin 3 times daily before meals 20-10-20 units      . insulin NPH (HUMULIN N,NOVOLIN N) 100 UNIT/ML injection Inject 25 Units into the skin at bedtime.       Marland Kitchen lisinopril (PRINIVIL,ZESTRIL) 10 MG tablet 1 BY MOUTH ONCE DAILY FOR BLOOD PRESSURE AND RENAL PROTECTION.  30 tablet  5  . naproxen sodium (ANAPROX) 220 MG tablet Take 220 mg by mouth 2 (two) times daily with a meal.        . omeprazole (PRILOSEC) 40 MG capsule TAKE ONE CAPSULE BY MOUTH EVERY DAY  30 capsule  5  . rOPINIRole (REQUIP) 3 MG tablet TAKE 1 TABLET BY MOUTH AT BEDTIME  30 tablet  5  . simvastatin (ZOCOR) 40 MG tablet TAKE 1 TABLET BY MOUTH IN THE EVENING FOR CHOLESTEROL  30 tablet  5  . DISCONTD: insulin NPH (HUMULIN N) 100 UNIT/ML injection Inject 5 Units into the skin at bedtime.  10 mL  12    Allergies  Allergen Reactions  . Pioglitazone     REACTION: weight gain    Family History  Problem Relation Age of Onset  . Hypertension Mother   . Stroke Mother     CVA  .  Cancer Father     Colon Cancer with Mets, Lung Cancer  . Cancer Sister     Breast Cancer  . Hypertension Brother   . Stroke Brother     CVA  . Cancer Brother     Prostate Cancer  . Cancer Sister     Breast Cancer  . Leukemia Sister   . Diabetes Sister   . Lupus Sister   . Diabetes Brother   . Thyroid disease Other     Brother (1974) thyroid surgery  . Thyroid disease Other     Sister (1981) thyroid surgery    BP 124/62  Pulse 82  Temp(Src) 97.3 F (36.3 C) (Oral)  Ht 5\' 3"  (1.6 m)  Wt 211 lb 3.2 oz (95.8 kg)  BMI 37.41 kg/m2  SpO2 95%  Review of Systems Denies loc    Objective:   Physical Exam VITAL SIGNS:  See vs page GENERAL: no distress Pulses: dorsalis pedis intact bilat.   Feet: no deformity.  no ulcer on the feet.  feet are of normal color and temp.  no edema.  There is bilateral onychomycosis. Neuro: sensation is intact to touch on the feet.    Lab Results  Component Value Date   HGBA1C 8.5*  10/07/2011      Assessment & Plan:  DM, Based on the pattern of her cbg's, she needs some adjustment in her therapy

## 2011-11-08 NOTE — Patient Instructions (Addendum)
please decrease humalog to (just before each meal) 20-10-20 units, and: Increase bedtime nph to 25 units.   Please make a follow-up appointment in 2 months.  Please call sooner if your blood sugar goes below 80, or if it stays over 200 at bedtime.  It is important to also check blood sugar at bedtime. check your blood sugar 2 times a day.  vary the time of day when you check, between before the 3 meals, and at bedtime.  also check if you have symptoms of your blood sugar being too high or too low.  please keep a record of the readings and bring it to your next appointment here.  please call us sooner if you are having low blood sugar episodes.

## 2011-11-14 ENCOUNTER — Other Ambulatory Visit: Payer: Self-pay

## 2011-11-14 MED ORDER — INSULIN LISPRO 100 UNIT/ML ~~LOC~~ SOLN: SUBCUTANEOUS | Status: DC

## 2011-11-14 MED ORDER — INSULIN NPH (HUMAN) (ISOPHANE) 100 UNIT/ML ~~LOC~~ SUSP: 25.0000 [IU] | Freq: Every day | SUBCUTANEOUS | Status: DC

## 2012-01-01 ENCOUNTER — Other Ambulatory Visit: Payer: Self-pay | Admitting: Internal Medicine

## 2012-01-03 NOTE — Telephone Encounter (Signed)
Rx losinopril sent to [facility] pharmacy

## 2012-01-12 ENCOUNTER — Other Ambulatory Visit (INDEPENDENT_AMBULATORY_CARE_PROVIDER_SITE_OTHER): Payer: Medicare Other

## 2012-01-12 ENCOUNTER — Encounter: Payer: Self-pay | Admitting: Endocrinology

## 2012-01-12 ENCOUNTER — Ambulatory Visit (INDEPENDENT_AMBULATORY_CARE_PROVIDER_SITE_OTHER): Payer: Medicare Other | Admitting: Endocrinology

## 2012-01-12 VITALS — BP 124/82 | HR 84 | Temp 97.5°F | Ht 63.0 in | Wt 210.0 lb

## 2012-01-12 MED ORDER — INSULIN LISPRO 100 UNIT/ML ~~LOC~~ SOLN: SUBCUTANEOUS | Status: DC

## 2012-01-12 NOTE — Patient Instructions (Addendum)
please decrease humalog to (just before each meal) 20-10-15 units, and: Increase bedtime nph to 30 units.   Please make a follow-up appointment in 3 months.  blood tests are being requested for you today.  You will receive a letter with results. Please call sooner if your blood sugar goes below 80, or if it stays over 200 at bedtime.  It is important to also check blood sugar at bedtime. check your blood sugar 2 times a day.  vary the time of day when you check, between before the 3 meals, and at bedtime.  also check if you have symptoms of your blood sugar being too high or too low.  please keep a record of the readings and bring it to your next appointment here.  please call us sooner if you are having low blood sugar episodes.

## 2012-01-12 NOTE — Progress Notes (Signed)
Subjective:    Patient ID: Carrie White, female    DOB: 1941-04-19, 71 y.o.   MRN: [id number]  HPI Pt returns for f/u of insulin-requiring DM (dx'ed 2008; no known complications).  she brings a record of her cbg's which i have reviewed today.  It varies from 54 (after evening meal) up to mid-100's (am).   She does not eat hs-snack.  She says she can't predict activity.   Past Medical History  Diagnosis Date  . GOITER, MULTINODULAR 07/01/2010  . HYPERLIPIDEMIA 07/31/2008  . INSOMNIA, CHRONIC 06/29/2009  . RESTLESS LEG SYNDROME 05/07/2007  . HYPERTENSION 08/01/2008  . OSTEOARTHRITIS 05/07/2007  . FIBROMYALGIA 05/07/2007  . Diabetes mellitus     type II- uncontrolled  . Fibromyalgia   . 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 Abdominal hysterectomy   . Oophorectomy   . Mouth surgery [id number]  . Colonoscopy   . Dilation and curettage of uterus 1972  . Breast mass excision 07/18/11    left breast  . Mastectomy, partial 07/18/2011    History   Social History  . Marital Status: Married    Spouse Name: N/A    Number of Children: N/A  . Years of Education: N/A   Occupational History  . Not on file.   Social History Main Topics  . Smoking status: Former Smoker    Quit date: 07/13/1969  . Smokeless tobacco: Not on file   Comment: quit in the 70's  . Alcohol Use: No  . Drug Use: No  . Sexually Active: Not on file   Other Topics Concern  . 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 acetaminophen-codeine (TYLENOL #3) 300-30 MG per tablet Take 1 tablet by mouth every morning.  30 tablet  3  . B-D ULTRAFINE III SHORT PEN 31G X 8 MM MISC USE AS DIRECTED  100 each  3  . BAYER BREEZE 2 TEST DISK TEST TWO TIMES A DAY AS NEEDED  100 each  11  .  insulin lispro (HUMALOG) 100 UNIT/ML injection kwikpen; Inject into the skin 3 times daily before meals 20-10-15 units  30 mL  11  . lisinopril (PRINIVIL,ZESTRIL) 10 MG tablet 1 BY MOUTH ONCE DAILY FOR BLOOD PRESSURE AND RENAL PROTECTION.  30 tablet  5  . naproxen sodium (ANAPROX) 220 MG tablet Take 220 mg by mouth 2 (two) times daily with a meal.        . omeprazole (PRILOSEC) 40 MG capsule TAKE ONE CAPSULE BY MOUTH EVERY DAY  30 capsule  5  . rOPINIRole (REQUIP) 3 MG tablet TAKE 1 TABLET BY MOUTH AT BEDTIME  30 tablet  5  . simvastatin (ZOCOR) 40 MG tablet TAKE 1 TABLET BY MOUTH IN THE EVENING FOR CHOLESTEROL  30 tablet  5  . DISCONTD: insulin NPH (HUMULIN N) 100 UNIT/ML injection Inject 5 Units into the skin at bedtime.  10 mL  12    Allergies  Allergen Reactions  . Pioglitazone     REACTION: weight gain    Family History  Problem Relation Age of Onset  . Hypertension Mother   . Stroke Mother     CVA  . Cancer Father     Colon Cancer with Mets, Lung Cancer  .  Cancer Sister     Breast Cancer  . Hypertension Brother   . Stroke Brother     CVA  . Cancer Brother     Prostate Cancer  . Cancer Sister     Breast Cancer  . Leukemia Sister   . Diabetes Sister   . Lupus Sister   . Diabetes Brother   . Thyroid disease Other     Brother (1974) thyroid surgery  . Thyroid disease Other     Sister (1981) thyroid surgery    BP 124/82  Pulse 84  Temp(Src) 97.5 F (36.4 C) (Oral)  Ht 5\' 3"  (1.6 m)  Wt 210 lb (95.255 kg)  BMI 37.20 kg/m2  SpO2 97%  Review of Systems Denies LOC    Objective:   Physical Exam VITAL SIGNS:  See vs page GENERAL: no distress SKIN:  Insulin injection sites at the anterior abdomen are normal, except for a few ecchymoses.  Lab Results  Component Value Date   HGBA1C 6.8* 01/12/2012      Assessment & Plan:  DM. Based on the pattern of her cbg's, she needs slight adjustment in her therapy

## 2012-01-13 ENCOUNTER — Telehealth: Payer: Self-pay | Admitting: *Deleted

## 2012-01-13 NOTE — Telephone Encounter (Signed)
Called pt to inform of lab results, no answer/unable to leave message (letter also mailed to pt).  

## 2012-01-16 NOTE — Telephone Encounter (Signed)
Pt informed of lab results. 

## 2012-01-17 ENCOUNTER — Other Ambulatory Visit: Payer: Self-pay | Admitting: Internal Medicine

## 2012-03-12 ENCOUNTER — Other Ambulatory Visit: Payer: Self-pay | Admitting: Internal Medicine

## 2012-03-23 ENCOUNTER — Other Ambulatory Visit: Payer: Self-pay | Admitting: Endocrinology

## 2012-04-10 ENCOUNTER — Other Ambulatory Visit: Payer: Self-pay | Admitting: Internal Medicine

## 2012-04-10 ENCOUNTER — Encounter: Payer: Self-pay | Admitting: Endocrinology

## 2012-04-10 ENCOUNTER — Ambulatory Visit (INDEPENDENT_AMBULATORY_CARE_PROVIDER_SITE_OTHER): Payer: Medicare Other | Admitting: Endocrinology

## 2012-04-10 ENCOUNTER — Other Ambulatory Visit (INDEPENDENT_AMBULATORY_CARE_PROVIDER_SITE_OTHER): Payer: Medicare Other

## 2012-04-10 VITALS — BP 118/56 | HR 90 | Temp 97.4°F | Ht 63.0 in | Wt 208.0 lb

## 2012-04-10 LAB — HEMOGLOBIN A1C: Hgb A1c MFr Bld: 6.7 % — ABNORMAL HIGH (ref 4.6–6.5)

## 2012-04-10 NOTE — Patient Instructions (Addendum)
please change humalog to (just before each meal) 15-10-15 units, and: continue bedtime nph, 30 units.   Please make a follow-up appointment in January. blood tests are being requested for you today.  You will receive a letter with results. Please call sooner if your blood sugar goes below 80, or if it stays over 200 at bedtime.  It is important to also check blood sugar at bedtime. check your blood sugar 2 times a day.  vary the time of day when you check, between before the 3 meals, and at bedtime.  also check if you have symptoms of your blood sugar being too high or too low.  please keep a record of the readings and bring it to your next appointment here.  please call us sooner if you are having low blood sugar episodes.

## 2012-04-10 NOTE — Progress Notes (Signed)
Subjective:    Patient ID: Carrie White, female    DOB: 12-15-1940, 71 y.o.   MRN: [id number]  HPI Pt returns for f/u of insulin-requiring DM (dx'ed 2008; no known complications).  she brings a record of her cbg's which i have reviewed today.  It varies from 60 (after breakfast) up to mid-100's (hs).   Past Medical History  Diagnosis Date  . GOITER, MULTINODULAR 07/01/2010  . HYPERLIPIDEMIA 07/31/2008  . INSOMNIA, CHRONIC 06/29/2009  . RESTLESS LEG SYNDROME 05/07/2007  . HYPERTENSION 08/01/2008  . OSTEOARTHRITIS 05/07/2007  . FIBROMYALGIA 05/07/2007  . Diabetes mellitus     type II- uncontrolled  . Fibromyalgia   . 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 Abdominal hysterectomy   . Oophorectomy   . Mouth surgery 7829,5621  . Colonoscopy   . Dilation and curettage of uterus 1972  . Breast mass excision 07/18/11    left breast  . Mastectomy, partial 07/18/2011    History   Social History  . Marital Status: Married    Spouse Name: N/A    Number of Children: N/A  . Years of Education: N/A   Occupational History  . Not on file.   Social History Main Topics  . Smoking status: Former Smoker    Quit date: 07/13/1969  . Smokeless tobacco: Not on file   Comment: quit in the 70's  . Alcohol Use: No  . Drug Use: No  . Sexually Active: Not on file   Other Topics Concern  . 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 acetaminophen-codeine (TYLENOL #3) 300-30 MG per tablet Take 1 tablet by mouth every morning.  30 tablet  3  . BAYER BREEZE 2 TEST DISK TEST TWO TIMES A DAY AS NEEDED  100 each  11  . insulin lispro (HUMALOG) 100 UNIT/ML injection kwikpen; Inject into the skin 3 times daily before meals 15-10-15 units      . insulin NPH (HUMULIN  N,NOVOLIN N) 100 UNIT/ML injection Inject 30 Units into the skin at bedtime.      . Insulin Pen Needle (B-D ULTRAFINE III SHORT PEN) 31G X 8 MM MISC Use as directed three times a day dx 250.02  100 each  5  . lisinopril (PRINIVIL,ZESTRIL) 10 MG tablet 1 BY MOUTH ONCE DAILY FOR BLOOD PRESSURE AND RENAL PROTECTION.  30 tablet  5  . naproxen sodium (ANAPROX) 220 MG tablet Take 220 mg by mouth 2 (two) times daily with a meal.        . omeprazole (PRILOSEC) 40 MG capsule TAKE ONE CAPSULE BY MOUTH EVERY DAY  30 capsule  5  . rOPINIRole (REQUIP) 3 MG tablet TAKE 1 TABLET BY MOUTH AT BEDTIME  30 tablet  5  . simvastatin (ZOCOR) 40 MG tablet TAKE 1 TABLET BY MOUTH IN THE EVENING FOR CHOLESTEROL  30 tablet  5    Allergies  Allergen Reactions  . Pioglitazone     REACTION: weight gain    Family History  Problem Relation Age of Onset  . Hypertension Mother   . Stroke Mother     CVA  . Cancer Father     Colon Cancer with Mets, Lung Cancer  . Cancer Sister     Breast Cancer  .  Hypertension Brother   . Stroke Brother     CVA  . Cancer Brother     Prostate Cancer  . Cancer Sister     Breast Cancer  . Leukemia Sister   . Diabetes Sister   . Lupus Sister   . Diabetes Brother   . Thyroid disease Other     Brother (1974) thyroid surgery  . Thyroid disease Other     Sister (1981) thyroid surgery    BP 118/56  Pulse 90  Temp 97.4 F (36.3 C) (Oral)  Ht 5\' 3"  (1.6 m)  Wt 208 lb (94.348 kg)  BMI 36.85 kg/m2  SpO2 97%  Review of Systems Denies LOC    Objective:   Physical Exam VITAL SIGNS:  See vs page GENERAL: no distress Pulses: dorsalis pedis intact bilat.   Feet: no deformity.  no ulcer on the feet.  feet are of normal color and temp.  Trace bilat leg edema.  There is bilateral onychomycosis. Neuro: sensation is intact to touch on the feet.    Lab Results  Component Value Date   HGBA1C 6.7* 04/10/2012      Assessment & Plan:  DM: Based on the pattern of her cbg's, she  needs some adjustment in her therapy

## 2012-04-12 ENCOUNTER — Telehealth: Payer: Self-pay | Admitting: *Deleted

## 2012-04-12 NOTE — Telephone Encounter (Signed)
Called pt to inform of lab results, no answer/unable to leave message (letter also mailed to pt).  

## 2012-04-18 NOTE — Telephone Encounter (Signed)
Called pt, no answer/unable to leave message.  

## 2012-04-20 NOTE — Telephone Encounter (Signed)
No answer/unable to leave message (letter also mailed to pt). Closing phone note.

## 2012-05-09 ENCOUNTER — Other Ambulatory Visit: Payer: Self-pay | Admitting: *Deleted

## 2012-05-09 NOTE — Telephone Encounter (Signed)
PATIENT REQUEST REFILL ON MEDICATION APAP-COD #3 TABLET. HAS SEEN DR. ELLISON FOR LAST SEVERAL OFFICE VISIT. LAST REFILL ON MED 03/06/2012 PLEASE ADVISE

## 2012-05-09 NOTE — Telephone Encounter (Signed)
Patient not seen for almost 2 years. May have 1 month refill, needs ov with me or her doctor of choice prior to any additional refills.

## 2012-05-09 NOTE — Telephone Encounter (Signed)
I recall that the patient had changed PCP and that I am no longer involved in her care.

## 2012-05-09 NOTE — Telephone Encounter (Signed)
Dr Everardo All sees pt for DM only.

## 2012-05-10 MED ORDER — ACETAMINOPHEN-CODEINE #3 300-30 MG PO TABS: 1.0000 | ORAL_TABLET | ORAL | Status: DC

## 2012-05-10 NOTE — Telephone Encounter (Signed)
UNABLE TO REACH PATIENT BY PHONE # HOME OR WORK # TO LEAVE MESSAGE OF NEED FOR OV WITH DR. Debby Bud FOR FURTHER REFILLS. RX CALLED INTO PATIENT PHARMACY [facility] . PHARMACIST TO GIVE PATIENT NOTICE OF NEED FOR APPT. WITH DR. Debby Bud FOR FURTHER REFILLS.

## 2012-05-17 ENCOUNTER — Other Ambulatory Visit: Payer: Self-pay | Admitting: *Deleted

## 2012-05-17 MED ORDER — GLUCOSE BLOOD VI DISK: DISK | Status: DC

## 2012-07-01 ENCOUNTER — Other Ambulatory Visit: Payer: Self-pay | Admitting: Internal Medicine

## 2012-07-02 ENCOUNTER — Other Ambulatory Visit: Payer: Self-pay | Admitting: *Deleted

## 2012-07-02 MED ORDER — LISINOPRIL 10 MG PO TABS: 10.0000 mg | ORAL_TABLET | Freq: Every day | ORAL | Status: DC

## 2012-07-10 ENCOUNTER — Other Ambulatory Visit: Payer: Self-pay | Admitting: Internal Medicine

## 2012-07-27 ENCOUNTER — Telehealth: Payer: Self-pay | Admitting: *Deleted

## 2012-07-27 NOTE — Telephone Encounter (Signed)
PATIENT NOTIFIED THAT DR. ELLISON REVIEWED HER [facility] DIABETES TESTING LOG. DR. Everardo All WANTS HER TO CONTINUE TO CHECK HER BLOOD SUGAR BID( 2 X DAY)  WITH SAME Rx. NO CHANGE. PATIENT WILL KEEP FOLLOW UP APPT. WITH DR. Everardo All IN January.

## 2012-08-22 ENCOUNTER — Ambulatory Visit (INDEPENDENT_AMBULATORY_CARE_PROVIDER_SITE_OTHER): Payer: Medicare PPO | Admitting: Endocrinology

## 2012-08-22 ENCOUNTER — Encounter: Payer: Self-pay | Admitting: Endocrinology

## 2012-08-22 VITALS — BP 128/80 | HR 66 | Temp 97.9°F | Wt 211.0 lb

## 2012-08-22 DIAGNOSIS — IMO0001 Reserved for inherently not codable concepts without codable children: Secondary | ICD-10-CM

## 2012-08-22 MED ORDER — INSULIN PEN NEEDLE 31G X 8 MM MISC: Status: DC

## 2012-08-22 NOTE — Progress Notes (Signed)
Subjective:    Patient ID: Carrie White, female    DOB: 05-16-1941, 72 y.o.   MRN: [id number]  HPI Pt returns for f/u of insulin-requiring DM (dx'ed 2008; no known complications).  she brings a record of her cbg's which i have reviewed today.  It varies from 90-mid-100's.  There is no trend throughout the day.  pt states she feels well in general.   Past Medical History  Diagnosis Date  . GOITER, MULTINODULAR 07/01/2010  . HYPERLIPIDEMIA 07/31/2008  . INSOMNIA, CHRONIC 06/29/2009  . RESTLESS LEG SYNDROME 05/07/2007  . HYPERTENSION 08/01/2008  . OSTEOARTHRITIS 05/07/2007  . FIBROMYALGIA 05/07/2007  . Diabetes mellitus     type II- uncontrolled  . Fibromyalgia   . 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 Abdominal hysterectomy   . Oophorectomy   . Mouth surgery 9562,1308  . Colonoscopy   . Dilation and curettage of uterus 1972  . Breast mass excision 07/18/11    left breast  . Mastectomy, partial 07/18/2011    History   Social History  . Marital Status: Married    Spouse Name: N/A    Number of Children: N/A  . Years of Education: N/A   Occupational History  . Not on file.   Social History Main Topics  . Smoking status: Former Smoker    Quit date: 07/13/1969  . Smokeless tobacco: Not on file     Comment: quit in the 70's  . Alcohol Use: No  . Drug Use: No  . Sexually Active: Not on file   Other Topics Concern  . 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 acetaminophen-codeine (TYLENOL #3) 300-30 MG per tablet TAKE 1 TABLET BY MOUTH EVERY MORNING  30 tablet  1  . insulin lispro (HUMALOG) 100 UNIT/ML injection kwikpen; Inject into the skin 3 times daily before meals 15-10-15 units      . insulin NPH (HUMULIN N,NOVOLIN N) 100 UNIT/ML  injection Inject 30 Units into the skin at bedtime.      Marland Kitchen lisinopril (PRINIVIL,ZESTRIL) 10 MG tablet 1 BY MOUTH ONCE DAILY FOR BLOOD PRESSURE AND RENAL PROTECTION.  30 tablet  2  . naproxen sodium (ANAPROX) 220 MG tablet Take 220 mg by mouth 2 (two) times daily with a meal.        . omeprazole (PRILOSEC) 40 MG capsule TAKE ONE CAPSULE BY MOUTH EVERY DAY  30 capsule  5  . rOPINIRole (REQUIP) 3 MG tablet TAKE 1 TABLET BY MOUTH AT BEDTIME  30 tablet  5  . simvastatin (ZOCOR) 40 MG tablet TAKE 1 TABLET BY MOUTH IN THE EVENING FOR CHOLESTEROL  30 tablet  5    Allergies  Allergen Reactions  . Pioglitazone     REACTION: weight gain    Family History  Problem Relation Age of Onset  . Hypertension Mother   . Stroke Mother     CVA  . Cancer Father     Colon Cancer with Mets, Lung Cancer  . Cancer Sister     Breast Cancer  . Hypertension Brother   . Stroke Brother     CVA  . Cancer Brother     Prostate Cancer  . Cancer Sister     Breast Cancer  .  Leukemia Sister   . Diabetes Sister   . Lupus Sister   . Diabetes Brother   . Thyroid disease Other     Brother (1974) thyroid surgery  . Thyroid disease Other     Sister (1981) thyroid surgery    BP 128/80  Pulse 66  Temp 97.9 F (36.6 C) (Oral)  Wt 211 lb (95.709 kg)  SpO2 97%  Review of Systems denies hypoglycemia    Objective:   Physical Exam VITAL SIGNS:  See vs page GENERAL: no distress Pulses: dorsalis pedis intact bilat.   Feet: no deformity.  no ulcer on the feet.  feet are of normal color and temp.  Trace bilat leg edema.  There is bilateral onychomycosis. Neuro: sensation is intact to touch on the feet.   Lab Results  Component Value Date   HGBA1C 7.7* 08/22/2012      Assessment & Plan:  DM: therapy is limited by variable cbg's.  We'll continue the same rx for now

## 2012-08-22 NOTE — Patient Instructions (Addendum)
please continue humalog, (just before each meal) 15-10-15 units, and: continue bedtime nph, 30 units.   Please make a follow-up appointment in 3 months blood tests are being requested for you today.  We'll contact you with results. Please call sooner if your blood sugar goes below 80, or if it stays over 200 at bedtime.  It is important to also check blood sugar at bedtime. check your blood sugar 2 times a day.  vary the time of day when you check, between before the 3 meals, and at bedtime.  also check if you have symptoms of your blood sugar being too high or too low.  please keep a record of the readings and bring it to your next appointment here.  please call us sooner if you are having low blood sugar episodes.

## 2012-08-23 LAB — MICROALBUMIN / CREATININE URINE RATIO: Microalb Creat Ratio: 154.5 mg/g — ABNORMAL HIGH (ref 0.0–30.0)

## 2012-08-27 ENCOUNTER — Telehealth: Payer: Self-pay | Admitting: Endocrinology

## 2012-08-27 NOTE — Telephone Encounter (Signed)
i recommend 2 things: The "life-alert" is a good idea Also, when i recently saw pt, i did not increase her insulin, out of concern for low-blood sugar.

## 2012-08-27 NOTE — Telephone Encounter (Signed)
Pt's dtr advised and states an understanding 

## 2012-08-27 NOTE — Telephone Encounter (Signed)
Pt's dtr Victorino Dike [phone]) would like to know what you would recommend for the patient, since she is going to be alone now,if blood sugar were to drop if she should get a life alert, etc., please advise?

## 2012-08-27 NOTE — Telephone Encounter (Signed)
The patient's daughter called to speak with a nurse regarding her mother's care as the patient's husband recently passed away and was her caregiver.  The daughter will now be helping her mother and would like some information regarding her care.  The patient and her daughter may be reached at [phone].  The patient's daughter Carrie White may be reached by cell phone at [phone].

## 2012-09-06 ENCOUNTER — Other Ambulatory Visit: Payer: Self-pay | Admitting: Internal Medicine

## 2012-09-09 ENCOUNTER — Other Ambulatory Visit: Payer: Self-pay | Admitting: Internal Medicine

## 2012-09-10 ENCOUNTER — Other Ambulatory Visit: Payer: Self-pay | Admitting: *Deleted

## 2012-09-10 MED ORDER — ROPINIROLE HCL 3 MG PO TABS: 3.0000 mg | ORAL_TABLET | Freq: Every day | ORAL | Status: DC

## 2012-09-23 ENCOUNTER — Other Ambulatory Visit: Payer: Self-pay | Admitting: Internal Medicine

## 2012-09-24 ENCOUNTER — Other Ambulatory Visit: Payer: Self-pay | Admitting: *Deleted

## 2012-09-24 MED ORDER — LISINOPRIL 10 MG PO TABS: 10.0000 mg | ORAL_TABLET | Freq: Every day | ORAL | Status: DC

## 2012-09-25 ENCOUNTER — Other Ambulatory Visit: Payer: Self-pay | Admitting: Internal Medicine

## 2012-10-01 ENCOUNTER — Telehealth: Payer: Self-pay | Admitting: *Deleted

## 2012-10-01 NOTE — Telephone Encounter (Signed)
[facility], grief counselor with [facility] at [facility] of the [place] had called to request pt to see Dr Debby Bud to be evaluated. Pt lost her husband 1 month ago and the step daughter called stating the pt is hysterical and crying out of control. Called Ms Paton back, lvm per Dr Debby Bud that the pt needs either go to [facility] or [facility]. Ms. Kelby Fam called stating that there is a mobile crisis unit through [facility] and will come to her home to see her at no charge to the patient. Pt has agreed to see them. She just wanted to let Dr Debby Bud know what the latest update is for pt. Pt will need a follow up appt at some point.

## 2012-10-03 ENCOUNTER — Other Ambulatory Visit: Payer: Self-pay | Admitting: Internal Medicine

## 2012-10-22 ENCOUNTER — Ambulatory Visit (INDEPENDENT_AMBULATORY_CARE_PROVIDER_SITE_OTHER): Payer: Medicare PPO | Admitting: Internal Medicine

## 2012-10-22 ENCOUNTER — Ambulatory Visit (INDEPENDENT_AMBULATORY_CARE_PROVIDER_SITE_OTHER): Payer: Medicare PPO | Admitting: Professional

## 2012-10-22 ENCOUNTER — Encounter: Payer: Self-pay | Admitting: Internal Medicine

## 2012-10-22 VITALS — BP 110/70 | HR 84 | Temp 98.6°F | Wt 199.0 lb

## 2012-10-22 DIAGNOSIS — Z0000 Encounter for general adult medical examination without abnormal findings: Secondary | ICD-10-CM

## 2012-10-22 DIAGNOSIS — F4321 Adjustment disorder with depressed mood: Secondary | ICD-10-CM | POA: Insufficient documentation

## 2012-10-22 DIAGNOSIS — E785 Hyperlipidemia, unspecified: Secondary | ICD-10-CM

## 2012-10-22 DIAGNOSIS — F4323 Adjustment disorder with mixed anxiety and depressed mood: Secondary | ICD-10-CM

## 2012-10-22 DIAGNOSIS — Z23 Encounter for immunization: Secondary | ICD-10-CM

## 2012-10-22 DIAGNOSIS — I10 Essential (primary) hypertension: Secondary | ICD-10-CM

## 2012-10-22 NOTE — Progress Notes (Signed)
Subjective:     Patient ID: Carrie White, female   DOB: 1941-01-21, 72 y.o.   MRN: [id number]  HPI Carrie White is a 72 yo woman with a hx of insulin dependant diabetes that presents for her annual exam.  Pt reports being under great distress given the recent loss of her husband in January.  She is concerned today because she has not been sleeping well and she feels that her thoughts have been more jumbled recently.  She hasn't been to church since her husband's passing (this was a weekly occurrence for her prior).  She states that her step children have been visiting her and taking her places to get her out of the house.  She also reports difficulty getting back into the routine of housework and grocery shopping since, "he did everything."  Past Medical History  Diagnosis Date  . GOITER, MULTINODULAR 07/01/2010  . HYPERLIPIDEMIA 07/31/2008  . INSOMNIA, CHRONIC 06/29/2009  . RESTLESS LEG SYNDROME 05/07/2007  . HYPERTENSION 08/01/2008  . OSTEOARTHRITIS 05/07/2007  . FIBROMYALGIA 05/07/2007  . Diabetes mellitus     type II- uncontrolled  . Fibromyalgia   . Intraductal papilloma of breast 07/12/2011   Past Surgical History  Procedure Laterality Date  . Abdominal hysterectomy    . Oophorectomy    . Mouth surgery  4098,1191  . Colonoscopy    . Dilation and curettage of uterus  1972  . Breast mass excision  07/18/11    left breast  . Mastectomy, partial  07/18/2011   Family History  Problem Relation Age of Onset  . Hypertension Mother   . Stroke Mother     CVA  . Cancer Father     Colon Cancer with Mets, Lung Cancer  . Cancer Sister     Breast Cancer  . Hypertension Brother   . Stroke Brother     CVA  . Cancer Brother     Prostate Cancer  . Cancer Sister     Breast Cancer  . Leukemia Sister   . Diabetes Sister   . Lupus Sister   . Diabetes Brother   . Thyroid disease Other     Brother (1974) thyroid surgery  . Thyroid disease Other     Sister (1981) thyroid surgery    History   Social History  . Marital Status: Married    Spouse Name: N/A    Number of Children: N/A  . Years of Education: N/A   Occupational History  . Not on file.   Social History Main Topics  . Smoking status: Former Smoker    Quit date: 07/13/1969  . Smokeless tobacco: Not on file     Comment: quit in the 70's  . Alcohol Use: No  . Drug Use: No  . Sexually Active: Not on file   Other Topics Concern  . 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 acetaminophen-codeine (TYLENOL #3) 300-30 MG per tablet TAKE 1 TABLET BY MOUTH EVERY MORNING  30 tablet  1  . Glucose Blood DISK 1 each by Other route 2 (two) times daily. And lancets 2/day   250.01      . insulin lispro (HUMALOG) 100 UNIT/ML injection kwikpen; Inject into the skin 3 times daily before meals 15-10-15 units      .  insulin NPH (HUMULIN N,NOVOLIN N) 100 UNIT/ML injection Inject 30 Units into the skin at bedtime.      . Insulin Pen Needle (B-D ULTRAFINE III SHORT PEN) 31G X 8 MM MISC Use as directed three times a day dx 250.02  100 each  5  . lisinopril (PRINIVIL,ZESTRIL) 10 MG tablet Take 1 tablet (10 mg total) by mouth daily. Take 1 tablet (10 mg total) by mouth daily for blood pressure and renal protection.  30 tablet  2  . naproxen sodium (ANAPROX) 220 MG tablet Take 220 mg by mouth 2 (two) times daily with a meal.        . omeprazole (PRILOSEC) 40 MG capsule TAKE ONE CAPSULE BY MOUTH EVERY DAY  30 capsule  5  . rOPINIRole (REQUIP) 3 MG tablet Take 1 tablet (3 mg total) by mouth at bedtime.  30 tablet  2  . simvastatin (ZOCOR) 40 MG tablet TAKE 1 TABLET BY MOUTH IN THE EVENING FOR CHOLESTEROL  30 tablet  2   No current facility-administered medications on file prior to visit.   Review of Systems  Constitutional: Positive  for appetite change (hasn't been eating as much since husband's passing). Negative for fever.  Respiratory: Positive for shortness of breath (recent SOB when lying flat she is currently using three pillows to sleep). Negative for cough and chest tightness.   Gastrointestinal: Negative for nausea and diarrhea.  Musculoskeletal: Positive for arthralgias (Knees and hips hurt, she is able to ambulate without assistance but finds it difficult to rise from a seated position).  Neurological: Negative for numbness and headaches.       She reports tingling in her feet  Psychiatric/Behavioral: Positive for sleep disturbance and dysphoric mood. Negative for suicidal ideas.       Objective:   Physical Exam  Constitutional: She appears well-developed and well-nourished. She appears distressed.  HENT:  Head: Normocephalic and atraumatic.  Mouth/Throat: No oropharyngeal exudate.  L and R TMs visualized with no effusion or erythema  Eyes: Conjunctivae and EOM are normal. Pupils are equal, round, and reactive to light. Right eye exhibits no discharge. Left eye exhibits no discharge. No scleral icterus.  Neck: No JVD present. No thyromegaly present.  Cardiovascular: Normal rate, regular rhythm and normal heart sounds.  Exam reveals no friction rub.   No murmur heard. 2+ radial pulses, 1+ DP pulses bilaterally, bilateral 1+ edema to mid shin  Pulmonary/Chest: Effort normal and breath sounds normal. She has no wheezes. She has no rales.  Abdominal: Soft. Bowel sounds are normal. She exhibits no distension. There is no tenderness.  Obese  Genitourinary:  Deferred  Lymphadenopathy:    She has no cervical adenopathy.  Neurological: She is alert. She has normal reflexes. No cranial nerve deficit. Coordination normal.  Skin: Skin is warm and dry.  Psychiatric:  Pt openly crying during interview and exam   Filed Vitals:   10/22/12 1017  BP: 110/70  Pulse: 84  Temp: 98.6 F (37 C)   Lab Results   Component Value Date   WBC 5.6 02/24/2009   HGB 12.9 02/24/2009   HCT 37.6 02/24/2009   PLT 245.0 02/24/2009   GLUCOSE 163* 07/15/2011   CHOL 144 09/22/2009   TRIG 152.0* 09/22/2009   HDL 49.30 09/22/2009   LDLDIRECT 133.3 10/17/2006   LDLCALC 64 09/22/2009   ALT 23 09/22/2009   AST 18 09/22/2009   NA 142 07/15/2011   K 4.6 07/15/2011   CL 104 07/15/2011   CREATININE  0.57 07/15/2011   BUN 15 07/15/2011   CO2 31 07/15/2011   TSH 1.62 07/01/2010   HGBA1C 7.7* 08/22/2012   MICROALBUR 20.04* 08/22/2012             Assessment/Plan:     Healthcare Maintenance: - Pt is up to date with screening colonoscopy - Pnuemococcal vaccine booster today - Pt will check payment status with insurance regarding zoster vaccine     Patient interviewed, history reviewed, exam deferred to Mr. Wood, MSIII and not repeated due to the patient's emotional state.  Assessment and plans per the Problem oriented charting reviewed and I agree with the plan as outlined  M.[name], MD

## 2012-10-22 NOTE — Assessment & Plan Note (Signed)
Assessment - many of pt's current complaints are explained by normal grieving. Plan - Pt informed that her feelings and symptoms are completely normal and part of the grieving process.  Given the abrupt loss of her husband grief counseling was set up with Dr. Luiz Blare at [facility] 3/10.  Will have her follow up in 2 months to reassess mood/anxiety.

## 2012-10-23 ENCOUNTER — Telehealth: Payer: Self-pay | Admitting: Internal Medicine

## 2012-10-23 DIAGNOSIS — Z0000 Encounter for general adult medical examination without abnormal findings: Secondary | ICD-10-CM | POA: Insufficient documentation

## 2012-10-23 NOTE — Assessment & Plan Note (Signed)
Interval history most significant for loss of her husband and currently with overwhelming grief. Limited physical exam is normal. She is due for laboratory and will be contacted. She is current for colorectal and breast cancer screening. Immunizations - up to date except she is due for Shingles vaccine and will check with her insurance company about coverage.  In summary - a pleasant woman primarily managed by Dr. Everardo All. She is being seen today by DR. Graves who may make medication recommendations to [place]. She is asked to return for follow up in 1-2 months

## 2012-10-23 NOTE — Telephone Encounter (Signed)
Tried calling patient, no answer.  Will try again soon if call is not returned

## 2012-10-23 NOTE — Assessment & Plan Note (Signed)
Lab Results  Component Value Date   HGBA1C 7.7* 08/22/2012   Management deferred to Dr. Everardo All

## 2012-10-23 NOTE — Assessment & Plan Note (Signed)
Last lipid panel - 3 years ago.  Plan - patient to return for lipid Panel at her convenience. Recommendations to follow.

## 2012-10-23 NOTE — Assessment & Plan Note (Signed)
BP Readings from Last 3 Encounters:  10/22/12 110/70  08/22/12 128/80  04/10/12 118/56   Good control.   Plan Lab: Bmet  Continue present medication

## 2012-10-23 NOTE — Telephone Encounter (Signed)
Message copied by Newell Coral on Tue Oct 23, 2012 10:18 AM ------      Message from: Jacques Navy      Created: Tue Oct 23, 2012  4:11 AM       Please call patient to come by for lab work. Thanks ------

## 2012-12-05 ENCOUNTER — Other Ambulatory Visit: Payer: Self-pay | Admitting: Internal Medicine

## 2012-12-05 ENCOUNTER — Ambulatory Visit (INDEPENDENT_AMBULATORY_CARE_PROVIDER_SITE_OTHER): Payer: Medicare PPO | Admitting: Endocrinology

## 2012-12-05 ENCOUNTER — Encounter: Payer: Self-pay | Admitting: Endocrinology

## 2012-12-05 VITALS — BP 128/74 | HR 90 | Wt 200.0 lb

## 2012-12-05 DIAGNOSIS — IMO0001 Reserved for inherently not codable concepts without codable children: Secondary | ICD-10-CM

## 2012-12-05 LAB — HEMOGLOBIN A1C: Hgb A1c MFr Bld: 7.1 % — ABNORMAL HIGH (ref 4.6–6.5)

## 2012-12-05 NOTE — Progress Notes (Signed)
Subjective:    Patient ID: Carrie White, female    DOB: 05/16/1941, 72 y.o.   MRN: [id number]  HPI Pt returns for f/u of insulin-requiring DM (dx'ed 2008; no known complications).  she brings a record of her cbg's which i have reviewed today.  It varies from 61-160.  It is lowest at hs, and highest in am.  Since last ov, her husband died suddenly.   Past Medical History  Diagnosis Date  . GOITER, MULTINODULAR 07/01/2010  . HYPERLIPIDEMIA 07/31/2008  . INSOMNIA, CHRONIC 06/29/2009  . RESTLESS LEG SYNDROME 05/07/2007  . HYPERTENSION 08/01/2008  . OSTEOARTHRITIS 05/07/2007  . FIBROMYALGIA 05/07/2007  . Diabetes mellitus     type II- uncontrolled  . Fibromyalgia   . Intraductal papilloma of breast 07/12/2011    Past Surgical History  Procedure Laterality Date  . Abdominal hysterectomy    . Oophorectomy    . Mouth surgery  9562,1308  . Colonoscopy    . Dilation and curettage of uterus  1972  . Breast mass excision  07/18/11    left breast  . Mastectomy, partial  07/18/2011    History   Social History  . Marital Status: Married    Spouse Name: N/A    Number of Children: N/A  . Years of Education: N/A   Occupational History  . Not on file.   Social History Main Topics  . Smoking status: Former Smoker    Quit date: 07/13/1969  . Smokeless tobacco: Not on file     Comment: quit in the 70's  . Alcohol Use: No  . Drug Use: No  . Sexually Active: Not on file   Other Topics Concern  . 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 acetaminophen-codeine (TYLENOL #3) 300-30 MG per tablet TAKE 1 TABLET BY MOUTH EVERY MORNING  30 tablet  1  . Glucose Blood DISK 1 each by Other route 2 (two) times daily. And lancets 2/day   250.01      . insulin lispro (HUMALOG) 100 UNIT/ML injection  kwikpen; Inject into the skin 3 times daily before meals 15-10-10 units      . Insulin Pen Needle (B-D ULTRAFINE III SHORT PEN) 31G X 8 MM MISC Use as directed three times a day dx 250.02  100 each  5  . lisinopril (PRINIVIL,ZESTRIL) 10 MG tablet Take 1 tablet (10 mg total) by mouth daily. Take 1 tablet (10 mg total) by mouth daily for blood pressure and renal protection.  30 tablet  2  . naproxen sodium (ANAPROX) 220 MG tablet Take 220 mg by mouth 2 (two) times daily with a meal.        . omeprazole (PRILOSEC) 40 MG capsule TAKE ONE CAPSULE BY MOUTH EVERY DAY  30 capsule  5  . simvastatin (ZOCOR) 40 MG tablet TAKE 1 TABLET BY MOUTH IN THE EVENING FOR CHOLESTEROL  30 tablet  2  . insulin NPH (HUMULIN N,NOVOLIN N) 100 UNIT/ML injection Inject 35 Units into the skin at bedtime.        No current facility-administered medications on file prior to visit.    Allergies  Allergen Reactions  . Pioglitazone     REACTION: weight gain    Family History  Problem Relation Age of  Onset  . Hypertension Mother   . Stroke Mother     CVA  . Cancer Father     Colon Cancer with Mets, Lung Cancer  . Cancer Sister     Breast Cancer  . Hypertension Brother   . Stroke Brother     CVA  . Cancer Brother     Prostate Cancer  . Cancer Sister     Breast Cancer  . Leukemia Sister   . Diabetes Sister   . Lupus Sister   . Diabetes Brother   . Thyroid disease Other     Brother (1974) thyroid surgery  . Thyroid disease Other     Sister (1981) thyroid surgery    BP 128/74  Pulse 90  Wt 200 lb (90.719 kg)  BMI 35.44 kg/m2  SpO2 95%   Review of Systems Denies LOC    Objective:   Physical Exam VITAL SIGNS:  See vs page GENERAL: no distress   Lab Results  Component Value Date   HGBA1C 7.1* 12/05/2012      Assessment & Plan:  DM: Based on the pattern of her cbg's, she needs some adjustment in her therapy

## 2012-12-05 NOTE — Patient Instructions (Addendum)
please continue humalog, (just before each meal) 15-10-10 units, and: increase bedtime nph to 35 units.   Please make a follow-up appointment in 3 months.  blood tests are being requested for you today.  We'll contact you with results.   Please call sooner if your blood sugar goes below 80, or if it stays over 200 at bedtime.  It is important to also check blood sugar at bedtime. check your blood sugar 2 times a day.  vary the time of day when you check, between before the 3 meals, and at bedtime.  also check if you have symptoms of your blood sugar being too high or too low.  please keep a record of the readings and bring it to your next appointment here.  please call us sooner if you are having low blood sugar episodes.

## 2012-12-06 ENCOUNTER — Other Ambulatory Visit: Payer: Self-pay | Admitting: Internal Medicine

## 2012-12-07 ENCOUNTER — Other Ambulatory Visit: Payer: Self-pay | Admitting: Internal Medicine

## 2012-12-07 NOTE — Telephone Encounter (Signed)
Faxed hardcopy to pharmacy. 

## 2012-12-07 NOTE — Telephone Encounter (Signed)
Done hardcopy to robin  

## 2012-12-14 ENCOUNTER — Other Ambulatory Visit: Payer: Self-pay | Admitting: Internal Medicine

## 2013-01-08 ENCOUNTER — Ambulatory Visit (INDEPENDENT_AMBULATORY_CARE_PROVIDER_SITE_OTHER): Payer: Medicare PPO | Admitting: Internal Medicine

## 2013-01-08 ENCOUNTER — Encounter: Payer: Self-pay | Admitting: Internal Medicine

## 2013-01-08 VITALS — BP 120/56 | HR 77 | Temp 97.9°F | Ht 63.0 in | Wt 201.4 lb

## 2013-01-08 DIAGNOSIS — F4321 Adjustment disorder with depressed mood: Secondary | ICD-10-CM

## 2013-01-08 MED ORDER — SERTRALINE HCL 50 MG PO TABS: 50.0000 mg | ORAL_TABLET | Freq: Every day | ORAL | Status: DC

## 2013-01-08 NOTE — Progress Notes (Signed)
  Subjective:    Patient ID: Carrie White, female    DOB: 1940/10/25, 72 y.o.   MRN: [id number]  HPI Returns for follow up for grief and loss. She has seen Sloan Leiter but cannot afford on going care/visits. She is still very tearful and upset. She reports that she is not sleeping well.  She has increased OA pain: hands and knees and back. She does take aleve 2 tablets in the AM.   Current Outpatient Prescriptions on File Prior to Visit  Medication Sig Dispense Refill  . acetaminophen-codeine (TYLENOL #3) 300-30 MG per tablet TAKE 1 TABLET BY MOUTH EVERY MORNING  30 tablet  1  . BD ULTRA-FINE PEN NEEDLES 29G X 12.7MM MISC USE DAILY  100 each  1  . Glucose Blood DISK 1 each by Other route 2 (two) times daily. And lancets 2/day   250.01      . insulin lispro (HUMALOG) 100 UNIT/ML injection kwikpen; Inject into the skin 3 times daily before meals 15-10-10 units      . Insulin Pen Needle (B-D ULTRAFINE III SHORT PEN) 31G X 8 MM MISC Use as directed three times a day dx 250.02  100 each  5  . lisinopril (PRINIVIL,ZESTRIL) 10 MG tablet TAKE 1 TABLET BY MOUTH EVERY DAY  30 tablet  5  . naproxen sodium (ANAPROX) 220 MG tablet Take 220 mg by mouth 2 (two) times daily with a meal.        . omeprazole (PRILOSEC) 40 MG capsule TAKE ONE CAPSULE BY MOUTH EVERY DAY  30 capsule  5  . rOPINIRole (REQUIP) 3 MG tablet TAKE 1 TABLET (3 MG TOTAL) BY MOUTH AT BEDTIME.  30 tablet  5  . rOPINIRole (REQUIP) 3 MG tablet TAKE 1 TABLET (3 MG TOTAL) BY MOUTH AT BEDTIME.  30 tablet  2  . simvastatin (ZOCOR) 40 MG tablet TAKE 1 TABLET BY MOUTH IN THE EVENING FOR CHOLESTEROL  30 tablet  5  . insulin NPH (HUMULIN N,NOVOLIN N) 100 UNIT/ML injection Inject 35 Units into the skin at bedtime.        No current facility-administered medications on file prior to visit.      Review of Systems System review is negative for any constitutional, cardiac, pulmonary, GI or neuro symptoms or complaints other than as described in  the HPI.     Objective:   Physical Exam Filed Vitals:   01/08/13 1049  BP: 120/56  Pulse: 77  Temp: 97.9 F (36.6 C)   Wt Readings from Last 3 Encounters:  01/08/13 201 lb 6.4 oz (91.354 kg)  12/05/12 200 lb (90.719 kg)  10/22/12 199 lb (90.266 kg)   Gen'l - very tearful white woman in no acute distress HEENT- C&S clear Cor- 2+ radial, RRR Pulm- normal respirations MSK - no effusion or limited ROM knees, no nodules or swelling at the MCP,PIP, DIP  Psych - anxious and tearfull       Assessment & Plan:

## 2013-01-08 NOTE — Assessment & Plan Note (Signed)
Still very depressed and tearful. Not sleeping well.  Plan  sertraline 50 mg in the PM.   Recheck in 1 month

## 2013-01-08 NOTE — Patient Instructions (Addendum)
Grief and loss: Still very depressed and tearful. Not sleeping well.  Plan  sertraline 50 mg in the PM.   Recheck in 1 month  Osteoarthritis - painful knees and hands. No obvious joint deformity  Plan  Take 2 aleve in the AM and PM  Continue prilosec  Report back for any stomach pain.  If this doesn't help we can go to prescription products  Continue flex and stretch exercise.   Diabetes - will defer to Dr. Everardo All

## 2013-01-09 NOTE — Assessment & Plan Note (Signed)
Doing well. Continues to follow with endocrinology

## 2013-01-13 ENCOUNTER — Other Ambulatory Visit: Payer: Self-pay | Admitting: Internal Medicine

## 2013-02-11 ENCOUNTER — Ambulatory Visit (INDEPENDENT_AMBULATORY_CARE_PROVIDER_SITE_OTHER): Payer: Medicare PPO | Admitting: Internal Medicine

## 2013-02-11 ENCOUNTER — Encounter: Payer: Self-pay | Admitting: Internal Medicine

## 2013-02-11 ENCOUNTER — Ambulatory Visit (INDEPENDENT_AMBULATORY_CARE_PROVIDER_SITE_OTHER)
Admission: RE | Admit: 2013-02-11 | Discharge: 2013-02-11 | Disposition: A | Discharge status: Home / Self Care | Payer: Medicare PPO | Source: Ambulatory Visit | Attending: Internal Medicine | Admitting: Internal Medicine

## 2013-02-11 VITALS — BP 108/70 | HR 79 | Temp 97.9°F | Resp 16 | Ht 63.0 in | Wt 197.0 lb

## 2013-02-11 DIAGNOSIS — M25569 Pain in unspecified knee: Secondary | ICD-10-CM

## 2013-02-11 DIAGNOSIS — M25561 Pain in right knee: Secondary | ICD-10-CM

## 2013-02-11 DIAGNOSIS — M25562 Pain in left knee: Secondary | ICD-10-CM

## 2013-02-11 DIAGNOSIS — F4321 Adjustment disorder with depressed mood: Secondary | ICD-10-CM

## 2013-02-11 DIAGNOSIS — I10 Essential (primary) hypertension: Secondary | ICD-10-CM

## 2013-02-11 NOTE — Patient Instructions (Addendum)
I am glad that the Zoloft seems to be helping you. In order to minimize any adverse effect on  Sleep please take the Zoloft in the AM   Sleep is a learned or unlearned behavior. 5 principles of sleep hygiene - 1) regular hour to retire and rise 7days/wk 2) no stimulants - caffeine, chocolat, alcohol, 3) regular exercise  - every afternoon  4) sleep sanctuary - a space that is right light, temperature, sound level, good bed where all you do is sleep. 5) No extinction behaviors, e.g. Laying in bed awake doing anything but sleeping. This means if you have a bad night - no naps, etc  Knee pain - the tenderness is in the insertion point of the tendons of the quadracepts and not in the joint. Plan Bilateral knee x-rays to rule out arthritic damage  Use a rub of choice over the most tender areas, e.g. Icy-hot or aspercreme etc.  Let your trainer know about this problem - they can help you.   Using your computer: go to www.YouTube.com and on the search bar enter quadraceps tendonitis and stretch. You will find instructional videos that help

## 2013-02-11 NOTE — Progress Notes (Signed)
Subjective:    Patient ID: Carrie White, female    DOB: Dec 13, 1940, 72 y.o.   MRN: [id number]  HPI Carrie White presents for follow-up of grief and loss with depression. She has been zoloft with good results: she is less fearful, less tearful. She still has a problem with life-long insomnia that is perhaps a little worse.   She is seeing Dr. Everardo All for DM management and reports that she has been doing a little better as she has been calmer.  She is having a lot of pain in both knees - it is to the point where she cannot walk.   Past Medical History  Diagnosis Date  . GOITER, MULTINODULAR 07/01/2010  . HYPERLIPIDEMIA 07/31/2008  . INSOMNIA, CHRONIC 06/29/2009  . RESTLESS LEG SYNDROME 05/07/2007  . HYPERTENSION 08/01/2008  . OSTEOARTHRITIS 05/07/2007  . FIBROMYALGIA 05/07/2007  . Diabetes mellitus     type II- uncontrolled  . Fibromyalgia   . Intraductal papilloma of breast 07/12/2011   Past Surgical History  Procedure Laterality Date  . Abdominal hysterectomy    . Oophorectomy    . Mouth surgery  4098,1191  . Colonoscopy    . Dilation and curettage of uterus  1972  . Breast mass excision  07/18/11    left breast  . Mastectomy, partial  07/18/2011   Family History  Problem Relation Age of Onset  . Hypertension Mother   . Stroke Mother     CVA  . Cancer Father     Colon Cancer with Mets, Lung Cancer  . Cancer Sister     Breast Cancer  . Hypertension Brother   . Stroke Brother     CVA  . Cancer Brother     Prostate Cancer  . Cancer Sister     Breast Cancer  . Leukemia Sister   . Diabetes Sister   . Lupus Sister   . Diabetes Brother   . Thyroid disease Other     Brother (1974) thyroid surgery  . Thyroid disease Other     Sister (1981) thyroid surgery   History   Social History  . Marital Status: Married    Spouse Name: N/A    Number of Children: N/A  . Years of Education: N/A   Occupational History  . Not on file.   Social History Main Topics  .  Smoking status: Former Smoker    Quit date: 07/13/1969  . Smokeless tobacco: Not on file     Comment: quit in the 70's  . Alcohol Use: No  . Drug Use: No  . Sexually Active: Not on file   Other Topics Concern  . 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 acetaminophen-codeine (TYLENOL #3) 300-30 MG per tablet TAKE 1 TABLET BY MOUTH EVERY MORNING  30 tablet  1  . BD ULTRA-FINE PEN NEEDLES 29G X 12.7MM MISC USE DAILY  100 each  1  . Glucose Blood DISK 1 each by Other route 2 (two) times daily. And lancets 2/day   250.01      . HUMULIN N PEN 100 UNIT/ML SUPN INJECT 25 UNITS INTO THE SKIN AT BEDTIME.  3 mL  2  . insulin lispro (HUMALOG) 100 UNIT/ML injection kwikpen; Inject into the skin 3 times daily before  meals 15-10-10 units      . Insulin Pen Needle (B-D ULTRAFINE III SHORT PEN) 31G X 8 MM MISC Use as directed three times a day dx 250.02  100 each  5  . lisinopril (PRINIVIL,ZESTRIL) 10 MG tablet TAKE 1 TABLET BY MOUTH EVERY DAY  30 tablet  5  . naproxen sodium (ANAPROX) 220 MG tablet Take 220 mg by mouth 2 (two) times daily with a meal.        . omeprazole (PRILOSEC) 40 MG capsule TAKE ONE CAPSULE BY MOUTH EVERY DAY  30 capsule  5  . rOPINIRole (REQUIP) 3 MG tablet TAKE 1 TABLET (3 MG TOTAL) BY MOUTH AT BEDTIME.  30 tablet  5  . rOPINIRole (REQUIP) 3 MG tablet TAKE 1 TABLET (3 MG TOTAL) BY MOUTH AT BEDTIME.  30 tablet  2  . sertraline (ZOLOFT) 50 MG tablet Take 1 tablet (50 mg total) by mouth daily.  30 tablet  3  . simvastatin (ZOCOR) 40 MG tablet TAKE 1 TABLET BY MOUTH IN THE EVENING FOR CHOLESTEROL  30 tablet  5  . insulin NPH (HUMULIN N,NOVOLIN N) 100 UNIT/ML injection Inject 35 Units into the skin at bedtime.        No current facility-administered medications on file prior to visit.       Review of Systems System review is negative for any constitutional, cardiac, pulmonary, GI or neuro symptoms or complaints other than as described in the HPI.     Objective:   Physical Exam Filed Vitals:   02/11/13 1050  BP: 108/70  Pulse: 79  Temp: 97.9 F (36.6 C)  Resp: 16   Wt Readings from Last 3 Encounters:  02/11/13 197 lb (89.359 kg)  01/08/13 201 lb 6.4 oz (91.354 kg)  12/05/12 200 lb (90.719 kg)   BP Readings from Last 3 Encounters:  02/11/13 108/70  01/08/13 120/56  12/05/12 128/74   Gen'l- moderately overweight white woman. HEENT - [place]/AT, C&S clear  Cor- RRR Pulm - normal respirations MKS - very tender at the insertion of the vastus medialis and the vast lateralis bilaterally.       Assessment & Plan:  Knee pain - the tenderness is in the insertion point of the tendons of the quadracepts and not in the joint. Plan Bilateral knee x-rays to rule out arthritic damage  Use a rub of choice over the most tender areas, e.g. Icy-hot or aspercreme etc.  Let your trainer know about this problem - they can help you.   Using your computer: go to www.YouTube.com and on the search bar enter quadraceps tendonitis and stretch. You will find instructional videos that help  Addendum: Knee films:  *RADIOLOGY REPORT*  Clinical Data: Knee pain.  LEFT KNEE - 1-2 VIEW  Comparison: None.  Findings: Small joint effusion. No fracture. Minimal  patellofemoral osteophytosis.  IMPRESSION:  1. Small joint effusion.  2. Minimal patellofemoral osteophytosis.  RIGHT KNEE - 1-2 VIEW  Comparison: None.  Findings: Question tiny joint effusion. Trace patellofemoral  osteophytosis. No acute fracture.  IMPRESSION:  1. Question tiny joint effusion.  2. Trace patellofemoral osteophytosis

## 2013-02-12 ENCOUNTER — Encounter: Payer: Self-pay | Admitting: Internal Medicine

## 2013-02-12 NOTE — Assessment & Plan Note (Signed)
Mrs. Nathaniel reports she is doing better with Zoloft. Better sleep, less anxiety and less tearfulness although her emotions are still raw.   Plan Continue present dose of zoloft.

## 2013-02-12 NOTE — Assessment & Plan Note (Signed)
BP Readings from Last 3 Encounters:  02/11/13 108/70  01/08/13 120/56  12/05/12 128/74   Good control on present medications

## 2013-03-04 ENCOUNTER — Other Ambulatory Visit: Payer: Self-pay | Admitting: Internal Medicine

## 2013-03-05 ENCOUNTER — Other Ambulatory Visit: Payer: Self-pay | Admitting: Internal Medicine

## 2013-03-06 NOTE — Telephone Encounter (Signed)
Tylenol #3 called to pharmacy. 

## 2013-03-07 ENCOUNTER — Encounter: Payer: Self-pay | Admitting: Endocrinology

## 2013-03-07 ENCOUNTER — Ambulatory Visit (INDEPENDENT_AMBULATORY_CARE_PROVIDER_SITE_OTHER): Payer: Medicare PPO | Admitting: Endocrinology

## 2013-03-07 VITALS — BP 132/80 | HR 80 | Ht 63.0 in | Wt 201.0 lb

## 2013-03-07 DIAGNOSIS — IMO0001 Reserved for inherently not codable concepts without codable children: Secondary | ICD-10-CM

## 2013-03-07 NOTE — Progress Notes (Signed)
Subjective:    Patient ID: Carrie White, female    DOB: Nov 08, 1940, 72 y.o.   MRN: [id number]  HPI Pt returns for f/u of insulin-requiring DM (dx'ed 2008; she has mild if any neuropathy of the lower extremities; no known associated complications).  she brings a record of her cbg's which i have reviewed today.  It varies from 42-195, but most are in the low-100's.  It is lowest at hs, and highest in am.  her husband died suddenly, earlier this year. Past Medical History  Diagnosis Date  . GOITER, MULTINODULAR 07/01/2010  . HYPERLIPIDEMIA 07/31/2008  . INSOMNIA, CHRONIC 06/29/2009  . RESTLESS LEG SYNDROME 05/07/2007  . HYPERTENSION 08/01/2008  . OSTEOARTHRITIS 05/07/2007  . FIBROMYALGIA 05/07/2007  . Diabetes mellitus     type II- uncontrolled  . Fibromyalgia   . Intraductal papilloma of breast 07/12/2011    Past Surgical History  Procedure Laterality Date  . Abdominal hysterectomy    . Oophorectomy    . Mouth surgery  4098,1191  . Colonoscopy    . Dilation and curettage of uterus  1972  . Breast mass excision  07/18/11    left breast  . Mastectomy, partial  07/18/2011    History   Social History  . Marital Status: Married    Spouse Name: N/A    Number of Children: N/A  . Years of Education: N/A   Occupational History  . Not on file.   Social History Main Topics  . Smoking status: Former Smoker    Quit date: 07/13/1969  . Smokeless tobacco: Not on file     Comment: quit in the 70's  . Alcohol Use: No  . Drug Use: No  . Sexually Active: Not on file   Other Topics Concern  . 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 acetaminophen-codeine (TYLENOL #3) 300-30 MG per tablet TAKE 1 TABLET BY MOUTH IN THE MORNING  30 tablet  1  . BD ULTRA-FINE PEN NEEDLES 29G X 12.7MM MISC USE  DAILY  100 each  1  . Glucose Blood DISK 1 each by Other route 2 (two) times daily. And lancets 2/day   250.01      . HUMALOG KWIKPEN 100 UNIT/ML SOPN INJECT INTO THE SKIN 3 TIMES DAILY BEFORE MEALS 20-10-20 UNITS  3 mL  5  . HUMULIN N PEN 100 UNIT/ML SUPN INJECT 25 UNITS INTO THE SKIN AT BEDTIME.  3 mL  2  . insulin lispro (HUMALOG) 100 UNIT/ML injection kwikpen; Inject into the skin 3 times daily before meals 15-10-8 units      . Insulin Pen Needle (B-D ULTRAFINE III SHORT PEN) 31G X 8 MM MISC Use as directed three times a day dx 250.02  100 each  5  . lisinopril (PRINIVIL,ZESTRIL) 10 MG tablet TAKE 1 TABLET BY MOUTH EVERY DAY  30 tablet  5  . naproxen sodium (ANAPROX) 220 MG tablet Take 220 mg by mouth 2 (two) times daily with a meal.        . omeprazole (PRILOSEC) 40 MG capsule TAKE ONE CAPSULE BY MOUTH EVERY DAY  30 capsule  5  . rOPINIRole (REQUIP) 3 MG tablet TAKE 1 TABLET (3 MG TOTAL) BY MOUTH AT BEDTIME.  30 tablet  5  .  rOPINIRole (REQUIP) 3 MG tablet TAKE 1 TABLET (3 MG TOTAL) BY MOUTH AT BEDTIME.  30 tablet  2  . sertraline (ZOLOFT) 50 MG tablet Take 1 tablet (50 mg total) by mouth daily.  30 tablet  3  . simvastatin (ZOCOR) 40 MG tablet TAKE 1 TABLET BY MOUTH IN THE EVENING FOR CHOLESTEROL  30 tablet  5  . insulin NPH (HUMULIN N,NOVOLIN N) 100 UNIT/ML injection Inject 35 Units into the skin at bedtime.        No current facility-administered medications on file prior to visit.    Allergies  Allergen Reactions  . Pioglitazone     REACTION: weight gain    Family History  Problem Relation Age of Onset  . Hypertension Mother   . Stroke Mother     CVA  . Cancer Father     Colon Cancer with Mets, Lung Cancer  . Cancer Sister     Breast Cancer  . Hypertension Brother   . Stroke Brother     CVA  . Cancer Brother     Prostate Cancer  . Cancer Sister     Breast Cancer  . Leukemia Sister   . Diabetes Sister   . Lupus Sister   . Diabetes Brother   . Thyroid disease Other      Brother (1974) thyroid surgery  . Thyroid disease Other     Sister (1981) thyroid surgery   BP 132/80  Pulse 80  Ht 5\' 3"  (1.6 m)  Wt 201 lb (91.173 kg)  BMI 35.61 kg/m2  SpO2 97%  Review of Systems Denies LOC and weight change    Objective:   Physical Exam VITAL SIGNS:  See vs page.   GENERAL: no distress.  Psychiatry: tearful.    Lab Results  Component Value Date   HGBA1C 7.1* 03/07/2013      Assessment & Plan:  DM: This insulin regimen was chosen from multiple options, as it best matches her insulin to her changing requirements throughout the day.  The benefits of glycemic control must be weighed against the risks of hypoglycemia.  The next step is to prevent further hypoglycemia.

## 2013-03-07 NOTE — Patient Instructions (Addendum)
please reduce humalog to (just before each meal) 15-10-8 units, and: Please make a follow-up appointment in 3 months.  blood tests are being requested for you today.  We'll contact you with results.   Please call sooner if your blood sugar goes below 80, or if it stays over 200 at bedtime.  It is important to also check blood sugar at bedtime. check your blood sugar 2 times a day.  vary the time of day when you check, between before the 3 meals, and at bedtime.  also check if you have symptoms of your blood sugar being too high or too low.  please keep a record of the readings and bring it to your next appointment here.  please call us sooner if you are having low blood sugar episodes.

## 2013-03-13 ENCOUNTER — Ambulatory Visit (INDEPENDENT_AMBULATORY_CARE_PROVIDER_SITE_OTHER): Payer: Medicare PPO | Admitting: Internal Medicine

## 2013-03-13 ENCOUNTER — Encounter: Payer: Self-pay | Admitting: Internal Medicine

## 2013-03-13 VITALS — BP 136/72 | HR 68 | Temp 98.1°F | Wt 204.0 lb

## 2013-03-13 DIAGNOSIS — L259 Unspecified contact dermatitis, unspecified cause: Secondary | ICD-10-CM

## 2013-03-13 MED ORDER — PREDNISONE 10 MG PO TABS: ORAL_TABLET | ORAL | Status: DC

## 2013-03-13 NOTE — Progress Notes (Signed)
Subjective:    Patient ID: Carrie White, female    DOB: 1941/04/21, 72 y.o.   MRN: [id number]  HPI  Pt presents to the clinic today with c/o a rash on arms and legs, diarrhea and body aches. This started 4 days ago. The diarrhea and body aches occurred over the weekend but has now resolved. She has only taken Imodium OTC. She has not put anything on the rash. It does itch. She has never had a rash like this before. She has not started any new medications, ate any new types of food or been working outside near plants that she could be allergic to. She denies fever or chills. She has not had sick contact.  Review of Systems      Past Medical History  Diagnosis Date  . GOITER, MULTINODULAR 07/01/2010  . HYPERLIPIDEMIA 07/31/2008  . INSOMNIA, CHRONIC 06/29/2009  . RESTLESS LEG SYNDROME 05/07/2007  . HYPERTENSION 08/01/2008  . OSTEOARTHRITIS 05/07/2007  . FIBROMYALGIA 05/07/2007  . Diabetes mellitus     type II- uncontrolled  . Fibromyalgia   . Intraductal papilloma of breast 07/12/2011    Current Outpatient Prescriptions  Medication Sig Dispense Refill  . acetaminophen-codeine (TYLENOL #3) 300-30 MG per tablet TAKE 1 TABLET BY MOUTH IN THE MORNING  30 tablet  1  . BD ULTRA-FINE PEN NEEDLES 29G X 12.7MM MISC USE DAILY  100 each  1  . Glucose Blood DISK 1 each by Other route 2 (two) times daily. And lancets 2/day   250.01      . HUMALOG KWIKPEN 100 UNIT/ML SOPN INJECT INTO THE SKIN 3 TIMES DAILY BEFORE MEALS 20-10-20 UNITS  3 mL  5  . HUMULIN N PEN 100 UNIT/ML SUPN INJECT 25 UNITS INTO THE SKIN AT BEDTIME.  3 mL  2  . insulin lispro (HUMALOG) 100 UNIT/ML injection kwikpen; Inject into the skin 3 times daily before meals 15-10-8 units      . insulin NPH (HUMULIN N,NOVOLIN N) 100 UNIT/ML injection Inject 35 Units into the skin at bedtime.       . Insulin Pen Needle (B-D ULTRAFINE III SHORT PEN) 31G X 8 MM MISC Use as directed three times a day dx 250.02  100 each  5  . lisinopril  (PRINIVIL,ZESTRIL) 10 MG tablet TAKE 1 TABLET BY MOUTH EVERY DAY  30 tablet  5  . naproxen sodium (ANAPROX) 220 MG tablet Take 220 mg by mouth 2 (two) times daily with a meal.        . omeprazole (PRILOSEC) 40 MG capsule TAKE ONE CAPSULE BY MOUTH EVERY DAY  30 capsule  5  . rOPINIRole (REQUIP) 3 MG tablet TAKE 1 TABLET (3 MG TOTAL) BY MOUTH AT BEDTIME.  30 tablet  5  . rOPINIRole (REQUIP) 3 MG tablet TAKE 1 TABLET (3 MG TOTAL) BY MOUTH AT BEDTIME.  30 tablet  2  . sertraline (ZOLOFT) 50 MG tablet Take 1 tablet (50 mg total) by mouth daily.  30 tablet  3  . simvastatin (ZOCOR) 40 MG tablet TAKE 1 TABLET BY MOUTH IN THE EVENING FOR CHOLESTEROL  30 tablet  5   No current facility-administered medications for this visit.    Allergies  Allergen Reactions  . Pioglitazone     REACTION: weight gain    Family History  Problem Relation Age of Onset  . Hypertension Mother   . Stroke Mother     CVA  . Cancer Father     Colon Cancer with Mets,  Lung Cancer  . Cancer Sister     Breast Cancer  . Hypertension Brother   . Stroke Brother     CVA  . Cancer Brother     Prostate Cancer  . Cancer Sister     Breast Cancer  . Leukemia Sister   . Diabetes Sister   . Lupus Sister   . Diabetes Brother   . Thyroid disease Other     Brother (1974) thyroid surgery  . Thyroid disease Other     Sister (1981) thyroid surgery    History   Social History  . Marital Status: Married    Spouse Name: N/A    Number of Children: N/A  . Years of Education: N/A   Occupational History  . Not on file.   Social History Main Topics  . Smoking status: Former Smoker    Quit date: 07/13/1969  . Smokeless tobacco: Not on file     Comment: quit in the 70's  . Alcohol Use: No  . Drug Use: No  . Sexually Active: Not on file   Other Topics Concern  . 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  Gastrointestinal: Denies abdominal pain, bloating, constipation, diarrhea or blood in the stool.  Musculoskeletal: Denies decrease in range of motion, difficulty with gait, muscle pain or joint pain and swelling.  Skin: Pt reports rash on arms and legs. Denies redness, lesions or ulcercations.    No other specific complaints in a complete review of systems (except as listed in HPI above).  Objective:   Physical Exam   BP 136/72  Pulse 68  Temp(Src) 98.1 F (36.7 C) (Oral)  Wt 204 lb (92.534 kg)  BMI 36.15 kg/m2  SpO2 98% Wt Readings from Last 3 Encounters:  03/13/13 204 lb (92.534 kg)  03/07/13 201 lb (91.173 kg)  02/11/13 197 lb (89.359 kg)    General: Appears her stated age, well developed, well nourished in NAD. Skin: Warm, dry and intact. Papular rash with erythematous base, noted on arms and legs, exposed areas only. Cardiovascular: Normal rate and rhythm. S1,S2 noted.  No murmur, rubs or gallops noted. No JVD or BLE edema. No carotid bruits noted. Pulmonary/Chest: Normal effort and positive vesicular breath sounds. No respiratory distress. No wheezes, rales or ronchi noted.  Abdomen: Soft and nontender. Normal bowel sounds, no bruits noted. No distention or masses noted. Liver, spleen and kidneys non palpable. Musculoskeletal: Normal range of motion. No signs of joint swelling. No difficulty with gait.    BMET    Component Value Date/Time   NA 142 07/15/2011 1030   K 4.6 07/15/2011 1030   CL 104 07/15/2011 1030   CO2 31 07/15/2011 1030   GLUCOSE 163* 07/15/2011 1030   BUN 15 07/15/2011 1030   CREATININE 0.57 07/15/2011 1030   CALCIUM 9.3 07/15/2011 1030   GFRNONAA >90 07/15/2011 1030   GFRAA >90 07/15/2011 1030    Lipid Panel     Component Value Date/Time   CHOL 144 09/22/2009 0901   TRIG 152.0* 09/22/2009 0901   HDL 49.30 09/22/2009 0901   CHOLHDL 3 09/22/2009 0901   VLDL  30.4 09/22/2009 0901   LDLCALC 64 09/22/2009 0901    CBC    Component Value Date/Time   WBC 5.6 02/24/2009 0857  RBC 4.19 02/24/2009 0857   HGB 12.9 02/24/2009 0857   HCT 37.6 02/24/2009 0857   PLT 245.0 02/24/2009 0857   MCV 89.7 02/24/2009 0857   MCHC 34.4 02/24/2009 0857   RDW 12.5 02/24/2009 0857   LYMPHSABS 1.6 02/24/2009 0857   MONOABS 0.4 02/24/2009 0857   EOSABS 0.3 02/24/2009 0857   BASOSABS 0.0 02/24/2009 0857    Hgb A1C Lab Results  Component Value Date   HGBA1C 7.1* 03/07/2013         Assessment & Plan:   Dermatitis, likely contact, new onset:  eRx for pred taper Monitor sugars over the next few days- they will be higher on the steroids  RTC as needed or if symptoms persist or worsen

## 2013-03-13 NOTE — Patient Instructions (Signed)
Rash A rash is a change in the color or texture of your skin. There are many different types of rashes. You may have other problems that accompany your rash. CAUSES   Infections.  Allergic reactions. This can include allergies to pets or foods.  Certain medicines.  Exposure to certain chemicals, soaps, or cosmetics.  Heat.  Exposure to poisonous plants.  Tumors, both cancerous and noncancerous. SYMPTOMS   Redness.  Scaly skin.  Itchy skin.  Dry or cracked skin.  Bumps.  Blisters.  Pain. DIAGNOSIS  Your caregiver may do a physical exam to determine what type of rash you have. A skin sample (biopsy) may be taken and examined under a microscope. TREATMENT  Treatment depends on the type of rash you have. Your caregiver may prescribe certain medicines. For serious conditions, you may need to see a skin doctor (dermatologist). HOME CARE INSTRUCTIONS   Avoid the substance that caused your rash.  Do not scratch your rash. This can cause infection.  You may take cool baths to help stop itching.  Only take over-the-counter or prescription medicines as directed by your caregiver.  Keep all follow-up appointments as directed by your caregiver. SEEK IMMEDIATE MEDICAL CARE IF:  You have increasing pain, swelling, or redness.  You have a fever.  You have new or severe symptoms.  You have body aches, diarrhea, or vomiting.  Your rash is not better after 3 days. MAKE SURE YOU:  Understand these instructions.  Will watch your condition.  Will get help right away if you are not doing well or get worse. Document Released: 07/22/2002 Document Revised: 10/24/2011 Document Reviewed: 05/16/2011 [facility] Patient Information 2014 [place], [place]. Rash A rash is a change in the color or texture of your skin. There are many different types of rashes. You may have other problems that accompany your rash. CAUSES   Infections.  Allergic reactions. This can include allergies  to pets or foods.  Certain medicines.  Exposure to certain chemicals, soaps, or cosmetics.  Heat.  Exposure to poisonous plants.  Tumors, both cancerous and noncancerous. SYMPTOMS   Redness.  Scaly skin.  Itchy skin.  Dry or cracked skin.  Bumps.  Blisters.  Pain. DIAGNOSIS  Your caregiver may do a physical exam to determine what type of rash you have. A skin sample (biopsy) may be taken and examined under a microscope. TREATMENT  Treatment depends on the type of rash you have. Your caregiver may prescribe certain medicines. For serious conditions, you may need to see a skin doctor (dermatologist). HOME CARE INSTRUCTIONS   Avoid the substance that caused your rash.  Do not scratch your rash. This can cause infection.  You may take cool baths to help stop itching.  Only take over-the-counter or prescription medicines as directed by your caregiver.  Keep all follow-up appointments as directed by your caregiver. SEEK IMMEDIATE MEDICAL CARE IF:  You have increasing pain, swelling, or redness.  You have a fever.  You have new or severe symptoms.  You have body aches, diarrhea, or vomiting.  Your rash is not better after 3 days. MAKE SURE YOU:  Understand these instructions.  Will watch your condition.  Will get help right away if you are not doing well or get worse. Document Released: 07/22/2002 Document Revised: 10/24/2011 Document Reviewed: 05/16/2011 [facility] Patient Information 2014 [place], [place].

## 2013-04-06 ENCOUNTER — Other Ambulatory Visit: Payer: Self-pay | Admitting: Internal Medicine

## 2013-05-01 ENCOUNTER — Other Ambulatory Visit: Payer: Self-pay | Admitting: Internal Medicine

## 2013-05-02 ENCOUNTER — Other Ambulatory Visit: Payer: Self-pay | Admitting: Internal Medicine

## 2013-06-03 ENCOUNTER — Other Ambulatory Visit: Payer: Self-pay | Admitting: Internal Medicine

## 2013-06-05 ENCOUNTER — Telehealth: Payer: Self-pay | Admitting: Internal Medicine

## 2013-06-05 ENCOUNTER — Encounter: Payer: Self-pay | Admitting: Internal Medicine

## 2013-06-05 ENCOUNTER — Other Ambulatory Visit (INDEPENDENT_AMBULATORY_CARE_PROVIDER_SITE_OTHER): Payer: Medicare PPO

## 2013-06-05 ENCOUNTER — Ambulatory Visit (INDEPENDENT_AMBULATORY_CARE_PROVIDER_SITE_OTHER): Payer: Medicare PPO | Admitting: Family Medicine

## 2013-06-05 ENCOUNTER — Ambulatory Visit (INDEPENDENT_AMBULATORY_CARE_PROVIDER_SITE_OTHER): Payer: Medicare PPO | Admitting: Internal Medicine

## 2013-06-05 ENCOUNTER — Encounter: Payer: Self-pay | Admitting: Family Medicine

## 2013-06-05 VITALS — BP 134/84 | HR 73 | Wt 204.0 lb

## 2013-06-05 VITALS — BP 110/58 | HR 83 | Temp 98.1°F | Wt 203.0 lb

## 2013-06-05 DIAGNOSIS — M25529 Pain in unspecified elbow: Secondary | ICD-10-CM

## 2013-06-05 DIAGNOSIS — M1710 Unilateral primary osteoarthritis, unspecified knee: Secondary | ICD-10-CM | POA: Insufficient documentation

## 2013-06-05 DIAGNOSIS — M7710 Lateral epicondylitis, unspecified elbow: Secondary | ICD-10-CM | POA: Insufficient documentation

## 2013-06-05 DIAGNOSIS — F4321 Adjustment disorder with depressed mood: Secondary | ICD-10-CM

## 2013-06-05 DIAGNOSIS — M25521 Pain in right elbow: Secondary | ICD-10-CM

## 2013-06-05 DIAGNOSIS — M25561 Pain in right knee: Secondary | ICD-10-CM

## 2013-06-05 DIAGNOSIS — I10 Essential (primary) hypertension: Secondary | ICD-10-CM

## 2013-06-05 DIAGNOSIS — M7711 Lateral epicondylitis, right elbow: Secondary | ICD-10-CM

## 2013-06-05 DIAGNOSIS — E785 Hyperlipidemia, unspecified: Secondary | ICD-10-CM

## 2013-06-05 DIAGNOSIS — M25569 Pain in unspecified knee: Secondary | ICD-10-CM

## 2013-06-05 LAB — COMPREHENSIVE METABOLIC PANEL WITH GFR
ALT: 23 U/L (ref 0–35)
AST: 21 U/L (ref 0–37)
Albumin: 3.6 g/dL (ref 3.5–5.2)
Alkaline Phosphatase: 68 U/L (ref 39–117)
BUN: 23 mg/dL (ref 6–23)
CO2: 33 meq/L — ABNORMAL HIGH (ref 19–32)
Calcium: 9.3 mg/dL (ref 8.4–10.5)
Chloride: 105 meq/L (ref 96–112)
Creatinine, Ser: 0.6 mg/dL (ref 0.4–1.2)
GFR: 100.7 mL/min
Glucose, Bld: 147 mg/dL — ABNORMAL HIGH (ref 70–99)
Potassium: 4.5 meq/L (ref 3.5–5.1)
Sodium: 143 meq/L (ref 135–145)
Total Bilirubin: 0.5 mg/dL (ref 0.3–1.2)
Total Protein: 6.6 g/dL (ref 6.0–8.3)

## 2013-06-05 LAB — HEPATIC FUNCTION PANEL
ALT: 23 U/L (ref 0–35)
AST: 21 U/L (ref 0–37)
Albumin: 3.6 g/dL (ref 3.5–5.2)
Alkaline Phosphatase: 68 U/L (ref 39–117)
Bilirubin, Direct: 0.1 mg/dL (ref 0.0–0.3)
Total Bilirubin: 0.5 mg/dL (ref 0.3–1.2)
Total Protein: 6.6 g/dL (ref 6.0–8.3)

## 2013-06-05 LAB — HEMOGLOBIN A1C: Hgb A1c MFr Bld: 7.6 % — ABNORMAL HIGH (ref 4.6–6.5)

## 2013-06-05 LAB — LIPID PANEL
Cholesterol: 162 mg/dL (ref 0–200)
HDL: 55.7 mg/dL
LDL Cholesterol: 90 mg/dL (ref 0–99)
Total CHOL/HDL Ratio: 3
Triglycerides: 84 mg/dL (ref 0.0–149.0)
VLDL: 16.8 mg/dL (ref 0.0–40.0)

## 2013-06-05 NOTE — Telephone Encounter (Signed)
Patient would like it to be noted in her chart beside former smoker her quit date was March of 1970.

## 2013-06-05 NOTE — Telephone Encounter (Signed)
Noted  

## 2013-06-05 NOTE — Assessment & Plan Note (Signed)
Injection as described above. Patient given braces which were fitted by me today as well. Patient given home exercises that she'll do her daily basis. Discuss weight maintenance, discuss home exercises. Patient did walk out with significant decrease in pain. Patient will come back again in 3-4 weeks. If she continues to have pain at that time she may be a candidate for viscous supplementation. I would also like to get a weightbearing x-rays at followup is still having pain.

## 2013-06-05 NOTE — Patient Instructions (Signed)
Good to meet you Try the exercises daily.  Also ice 20 minutes 3-4 times daily.  Braces as needed.  Sleep in wrist brace but not knee brace.  Take tylenol 650 mg three times a day is the best evidence based medicine we have for arthritis.  Aleve 1-2 tabs twice a day with food or ibuprofen 600mg  twice daily with food can be added to tylenol.  Glucosamine sulfate 750mg  twice a day is a supplement that has been shown to help moderate to severe arthritis. Capsaicin topically up to four times a day may also help with pain. Vitamin D 1000IU daily Fish oil 3 grams daily.  It's important that you continue to stay active. Controlling your weight is important.  Consider physical therapy to strengthen muscles around the joint that hurts to take pressure off of the joint itself. Shoe inserts with good arch support may be helpful. Walker or cane if needed. Heat or ice 20 minutes at a time 3-4 times a day as needed to help with pain. Water aerobics and cycling with low resistance are the best two types of exercise for arthritis. Come back and see me in 3-4 weeks.   Come back again in 3-4 weeks.

## 2013-06-05 NOTE — Progress Notes (Signed)
Subjective:    Patient ID: Carrie White, female    DOB: Dec 30, 1940, 72 y.o.   MRN: [id number]  HPI Mrs Twombly presents for follow up: she has been taking zoloft - she says it does help and she is less fearful of being alone, has fewer tears.   Diabetes - sees Dr. Everardo All. She reports that her CBGs have been elevated. She has a follow up appointment with Dr. Everardo All. She has learned to administer her own insulin.  Knee pain bilateral - increasing discomfort and difficulty with ambulation. She would like to see Dr. Katrinka Blazing for sports medicine evaluation Past Medical History  Diagnosis Date  . GOITER, MULTINODULAR 07/01/2010  . HYPERLIPIDEMIA 07/31/2008  . INSOMNIA, CHRONIC 06/29/2009  . RESTLESS LEG SYNDROME 05/07/2007  . HYPERTENSION 08/01/2008  . OSTEOARTHRITIS 05/07/2007  . FIBROMYALGIA 05/07/2007  . Diabetes mellitus     type II- uncontrolled  . Fibromyalgia   . Intraductal papilloma of breast 07/12/2011   Past Surgical History  Procedure Laterality Date  . Abdominal hysterectomy    . Oophorectomy    . Mouth surgery  4098,1191  . Colonoscopy    . Dilation and curettage of uterus  1972  . Breast mass excision  07/18/11    left breast  . Mastectomy, partial  07/18/2011   Family History  Problem Relation Age of Onset  . Hypertension Mother   . Stroke Mother     CVA  . Cancer Father     Colon Cancer with Mets, Lung Cancer  . Cancer Sister     Breast Cancer  . Hypertension Brother   . Stroke Brother     CVA  . Cancer Brother     Prostate Cancer  . Cancer Sister     Breast Cancer  . Leukemia Sister   . Diabetes Sister   . Lupus Sister   . Diabetes Brother   . Thyroid disease Other     Brother (1974) thyroid surgery  . Thyroid disease Other     Sister (1981) thyroid surgery   History   Social History  . Marital Status: Married    Spouse Name: N/A    Number of Children: N/A  . Years of Education: N/A   Occupational History  . Not on file.   Social History  Main Topics  . Smoking status: Former Smoker    Quit date: 07/13/1969  . Smokeless tobacco: Not on file     Comment: quit in the 70's  . Alcohol Use: No  . Drug Use: No  . Sexual Activity: Not on file   Other Topics Concern  . 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 BD ULTRA-FINE PEN NEEDLES 29G X 12.7MM MISC USE DAILY  100 each  1  . Glucose Blood DISK 1 each by Other route 2 (two) times daily. And lancets 2/day   250.01      . HUMALOG KWIKPEN 100 UNIT/ML SOPN INJECT INTO THE SKIN 3 TIMES DAILY BEFORE MEALS 20-10-20 UNITS  3 mL  5  . HUMULIN N KWIKPEN 100 UNIT/ML SUPN INJECT 25 UNITS INTO THE SKIN AT BEDTIME.  15 mL  2  . insulin lispro (HUMALOG) 100 UNIT/ML injection kwikpen; Inject into the skin 3 times daily before meals 15-10-8  units      . Insulin Pen Needle (B-D ULTRAFINE III SHORT PEN) 31G X 8 MM MISC Use as directed three times a day dx 250.02  100 each  5  . lisinopril (PRINIVIL,ZESTRIL) 10 MG tablet TAKE 1 TABLET BY MOUTH EVERY DAY  30 tablet  5  . omeprazole (PRILOSEC) 40 MG capsule TAKE ONE CAPSULE BY MOUTH EVERY DAY  30 capsule  5  . predniSONE (DELTASONE) 10 MG tablet Take 3 tablets on days 1-2, take 2 tablets on days 3-4, take 1 tablet on days 5-6  12 tablet  0  . rOPINIRole (REQUIP) 3 MG tablet TAKE 1 TABLET (3 MG TOTAL) BY MOUTH AT BEDTIME.  30 tablet  5  . rOPINIRole (REQUIP) 3 MG tablet TAKE 1 TABLET (3 MG TOTAL) BY MOUTH AT BEDTIME.  30 tablet  2  . sertraline (ZOLOFT) 50 MG tablet TAKE 1 TABLET (50 MG TOTAL) BY MOUTH DAILY.  30 tablet  3  . simvastatin (ZOCOR) 40 MG tablet TAKE 1 TABLET BY MOUTH IN THE EVENING FOR CHOLESTEROL  30 tablet  5  . insulin NPH (HUMULIN N,NOVOLIN N) 100 UNIT/ML injection Inject 35 Units into the skin at bedtime.        No current  facility-administered medications on file prior to visit.      Review of Systems System review is negative for any constitutional, cardiac, pulmonary, GI or neuro symptoms or complaints other than as described in the HPI.     Objective:   Physical Exam Filed Vitals:   06/05/13 1059  BP: 110/58  Pulse: 83  Temp: 98.1 F (36.7 C)   Wt Readings from Last 3 Encounters:  06/05/13 204 lb (92.534 kg)  06/05/13 203 lb (92.08 kg)  03/13/13 204 lb (92.534 kg)   Gen'l - over weight, very emotional woman in no distress HEENT- C&S clear, PERRLA Cor- 2+ radial, RRR Pulm - normal respirations Neuro - A&O x 3 MSK - large knees, atalgic gait.       Assessment & Plan:

## 2013-06-05 NOTE — Assessment & Plan Note (Addendum)
This is secondary to patient's doing numerous repetitive activity as well as her transfer herself secondary to her knee pain. Wrist brace given today in fitted by me. Patient given home exercise program Over-the-counter anti-inflammatory Return in 3-4 weeks.

## 2013-06-05 NOTE — Progress Notes (Signed)
I'm seeing this patient by the request  of:  Illene Regulus, MD  CC: knee and elbow pain  HPI: Patient is a very pleasant 72 year old female still morning the loss of her husband 10 months ago coming in with multiple complaints.  Bilateral knee pain- patient states she has had bilateral knee pain for multiple months. Patient states it seems to be worsening. Patient states that her pain hurts her so that she does want to do things such as walking up or down stairs and feels that she is limiting her self in her activity secondary to this pain. Patient describes the pain as sharp and worse with any movement or going from a seated to standing position. Patient denies any clicking or locking or any giving out on her. Patient denies any radiation down the legs or any groin pain. Patient states that it seems to be constant and mostly in the anterior aspect of the knees bilaterally.  Pain is 9/10.  Patient is also having right elbow pain. Patient states this has been multiple months as well. Patient does enjoy cross stitching for a hobby which he has been doing more and more of because she is unable to walk secondary to her knee pain. Patient describes the pain more on the lateral aspect of the elbow worse when she extends her wrist. Patient denies any radiation down the arm or any numbness. Patient states that this feels active stretching or tearing sensation with certain movements. Patient has not tried anything over-the-counter at this time. Patient denies any weakness. Pain is 6/10.   Past medical, surgical, family and social history reviewed. Medications reviewed all in the electronic medical record.   Review of Systems: No headache, visual changes, nausea, vomiting, diarrhea, constipation, dizziness, abdominal pain, skin rash, fevers, chills, night sweats, weight loss, swollen lymph nodes, body aches, joint swelling, muscle aches, chest pain, shortness of breath, mood changes.   Objective:    Blood  pressure 134/84, pulse 73, weight 204 lb (92.534 kg), SpO2 96.00%.   General: No apparent distress alert and oriented x3 mood and affect normal, dressed appropriately.  HEENT: Pupils equal, extraocular movements intact Respiratory: Patient's speak in full sentences and does not appear short of breath Cardiovascular: No lower extremity edema, non tender, no erythema Skin: Warm dry intact with no signs of infection or rash on extremities or on axial skeleton. Abdomen: Soft nontender Neuro: Cranial nerves II through XII are intact, neurovascularly intact in all extremities with 2+ DTRs and 2+ pulses. Lymph: No lymphadenopathy of posterior or anterior cervical chain or axillae bilaterally.  Gait normal with good balance and coordination.  MSK: Non tender with full range of motion and good stability and symmetric strength and tone of shoulders, wrist, hip, and ankles bilaterally.  Elbow: Right Unremarkable to inspection. Range of motion full pronation, supination, flexion, extension. Strength is full to all of the above directions patient though does have significant pain with resisted extension of the middle finger at the lateral epicondylar region. Stable to varus, valgus stress. Negative moving valgus stress test. Tender to palpation over the lateral epicondylar region Ulnar nerve does not sublux. Negative cubital tunnel Tinel's. Contralateral side unremarkable  Knee: Bilateral Inspection shows trace effusion bilaterally Palpation tenderness to palpation mostly of the medial joint lines bilaterally ROM full in flexion and extension and lower leg rotation. Ligaments with solid consistent endpoints including ACL, PCL, LCL, MCL. Negative Mcmurray's, Apley's, and Thessalonian tests.  painful patellar compression. Patellar glide with crepitus. Patellar and quadriceps  tendons unremarkable. Hamstring and quadriceps strength is normal.   MSK US performed of: Bilateral knees This study was  ordered, performed, and interpreted by Terrilee Files D.O.  Knee: All structures visualized. Anteromedial, anterolateral, posteromedial, and posterolateral menisci shows the patient does have osteoarthritic changes bilaterally with degenerative changes of the meniscus but no true tear appreciated.  Patellar Tendon unremarkable on long and transverse views without effusion bilaterally No abnormality of prepatellar bursa. LCL and MCL unremarkable on long and transverse views. No abnormality of origin of medial or lateral head of the gastrocnemius. Suprapubic pouch bilaterally does have trace effusion.  IMPRESSION:  Bilateral knee effusion mild, with degenerative Ostrow arthritic changes and degenerative meniscal changes.  Musculoskeletal ultrasound was performed and interpreted by Terrilee Files D.O.   Elbow: Right Lateral epicondyle and common extensor tendon origin visualized. Patient does have edema in this area but no joint effusion. No avulsion or tear of the tendon appreciated.  Radial head unremarkable and located in annular ligament Medial epicondyle and common flexor tendon origin visualized.  No edema, effusions, or avulsions seen. Ulnar nerve in cubital tunnel unremarkable. Olecranon and triceps insertion visualized and unremarkable without edema, effusion, or avulsion.  No signs olecranon bursitis. Power doppler signal normal.  IMPRESSION: Lateral epicondylitis  Procedure note After informed written and verbal consent, patient was seated on exam table. Left knee was prepped with alcohol swab and utilizing anterolateral approach, patient's left knee space was injected with 2:2:1  Lidocaine 1% :marcaine 0.5%: Kenalog 40mg /dL. Patient tolerated the procedure well without immediate complications. Procedure note After informed written and verbal consent, patient was seated on exam table. Right knee was prepped with alcohol swab and utilizing anterolateral approach, patient's right knee space  was injected with 2:2:1  Lidocaine 1% :marcaine 0.5%: Kenalog 40mg /dL. Patient tolerated the procedure well without immediate complications.   Impression and Recommendations:     This case required medical decision making of moderate complexity.

## 2013-06-05 NOTE — Patient Instructions (Signed)
Grief and loss - the pain never goes away but it will get better. It is very important to continue with counseling/hospice grief work and continue your medication -zoloft. Continue with choir and church and seeing your friends.  Diabetes - will check the A1C today and it will be available to Dr. Everardo All  Cholesterol - not checked since 2011. Will check today with recommendations to follow.   Blood pressure - good control on present medications.   Immunizations - flu shot given today and your are otherwise up to date.

## 2013-06-06 NOTE — Assessment & Plan Note (Signed)
Zoloft is helping. She is still very fragile but is getting help - hospice grief counseling.   Plan Continue zoloft

## 2013-06-06 NOTE — Assessment & Plan Note (Signed)
No imaging. She has progressive problems but is reluctant to consider surgical consult.  Plan Referral to Dr. Katrinka Blazing for sports medicine.

## 2013-06-06 NOTE — Assessment & Plan Note (Signed)
Lab Results  Component Value Date   HGBA1C 7.6* 06/05/2013   She will see Dr. Everardo All Monday 10/27

## 2013-06-06 NOTE — Assessment & Plan Note (Signed)
BP Readings from Last 3 Encounters:  06/05/13 134/84  06/05/13 110/58  03/13/13 136/72   Adequate control on present medication

## 2013-06-10 ENCOUNTER — Ambulatory Visit: Payer: Medicare PPO | Admitting: Endocrinology

## 2013-06-14 ENCOUNTER — Telehealth: Payer: Self-pay | Admitting: Internal Medicine

## 2013-06-14 NOTE — Telephone Encounter (Signed)
06/14/2013  Pt left message.  Pt stated that she recently lost husband and has been seeing a Camera operator.  Pt stated that counselor wanted pt to contact  PCP and ask that if she can have an increase in her RX Zoloft from 1 tab to 2 tabs during this holiday season only, and then return back to 1 tab daily.  Please contact pt to advise.

## 2013-06-17 MED ORDER — SERTRALINE HCL 50 MG PO TABS: 100.0000 mg | ORAL_TABLET | Freq: Every day | ORAL | Status: DC

## 2013-06-17 NOTE — Telephone Encounter (Signed)
New rx has been sent to pharmacy

## 2013-06-17 NOTE — Telephone Encounter (Signed)
Ok to increase zoloft to 100 mg (two tabs) daily through Aug 15, 2013. Call in new Rx so she doesn't run out. Thanks

## 2013-06-20 ENCOUNTER — Other Ambulatory Visit: Payer: Self-pay

## 2013-06-21 ENCOUNTER — Other Ambulatory Visit: Payer: Self-pay | Admitting: Internal Medicine

## 2013-06-21 NOTE — Telephone Encounter (Signed)
Tylenol #3 called to pharmacy. 

## 2013-06-26 ENCOUNTER — Ambulatory Visit (INDEPENDENT_AMBULATORY_CARE_PROVIDER_SITE_OTHER)
Admission: RE | Admit: 2013-06-26 | Discharge: 2013-06-26 | Disposition: A | Discharge status: Home / Self Care | Payer: Medicare PPO | Source: Ambulatory Visit | Attending: Family Medicine | Admitting: Family Medicine

## 2013-06-26 ENCOUNTER — Encounter: Payer: Self-pay | Admitting: Endocrinology

## 2013-06-26 ENCOUNTER — Encounter: Payer: Self-pay | Admitting: Family Medicine

## 2013-06-26 ENCOUNTER — Ambulatory Visit (INDEPENDENT_AMBULATORY_CARE_PROVIDER_SITE_OTHER): Payer: Medicare PPO | Admitting: Family Medicine

## 2013-06-26 ENCOUNTER — Ambulatory Visit (INDEPENDENT_AMBULATORY_CARE_PROVIDER_SITE_OTHER): Payer: Medicare PPO | Admitting: Endocrinology

## 2013-06-26 VITALS — BP 140/60 | HR 80 | Temp 97.8°F | Resp 16 | Ht 62.0 in | Wt 205.6 lb

## 2013-06-26 VITALS — BP 142/84 | HR 69

## 2013-06-26 DIAGNOSIS — M25569 Pain in unspecified knee: Secondary | ICD-10-CM

## 2013-06-26 DIAGNOSIS — M1710 Unilateral primary osteoarthritis, unspecified knee: Secondary | ICD-10-CM

## 2013-06-26 DIAGNOSIS — E1049 Type 1 diabetes mellitus with other diabetic neurological complication: Secondary | ICD-10-CM

## 2013-06-26 DIAGNOSIS — M654 Radial styloid tenosynovitis [de Quervain]: Secondary | ICD-10-CM

## 2013-06-26 DIAGNOSIS — M25561 Pain in right knee: Secondary | ICD-10-CM

## 2013-06-26 MED ORDER — HYLAN G-F 20 16 MG/2ML IX SOSY: 1.0000 "application " | PREFILLED_SYRINGE | INTRA_ARTICULAR | Status: DC

## 2013-06-26 NOTE — Patient Instructions (Addendum)
I printed prescription for the knee injections We injected your thumb today Gave you exercises for your thumbs to do daily Wear braces at night for you thumbs Come back if you can get the medication for your knee injections cheaply.  Otherwise I can repeat th esteroid injections in your knees every 3 months.  Physical therapy will be calling you.  I will see you again soon.

## 2013-06-26 NOTE — Assessment & Plan Note (Signed)
Patient was injected on the right side today. Patient was given a brace, thumb spica on the left side to wear daily. If patient does not have any significant improvement on the left side and we will consider doing an injection as well. Patient was given home exercise program and discussed icing protocol. We will avoid anti-inflammatories for now. Patient will come back in 3 weeks. Patient's more important problem though is her osteoarthritis of her knees.

## 2013-06-26 NOTE — Patient Instructions (Signed)
Please make a follow-up appointment in 3 months.  Please continue the same insulin for now. Please call sooner if your blood sugar goes below 80, or if it stays over 200 at bedtime.  It is important to also check blood sugar at bedtime. check your blood sugar 2 times a day.  vary the time of day when you check, between before the 3 meals, and at bedtime.  also check if you have symptoms of your blood sugar being too high or too low.  please keep a record of the readings and bring it to your next appointment here.  please call us sooner if you are having low blood sugar episodes.

## 2013-06-26 NOTE — Assessment & Plan Note (Signed)
Patient does have severe osteoarthritis changes. We will get x-ray of the knees bilaterally. Patient likely will be end-stage. We did discuss potentially starting viscous supplementation and patient was given a prescription. She will go try to pick up his medication if not too expensive and then make a followup appointment for starting injections. Patient will continue the exercises and icing protocol. Will send to formal physical therapy. Discuss water aerobics. If patient is unable to do the viscous supplementation and depending on the amount of arthritis seen on x-ray we may want to consider surgical intervention.

## 2013-06-26 NOTE — Progress Notes (Signed)
CC: Followup knee pain and new bilateral thumb pain  HPI: Patient is a very pleasant 72 year old female who is diagnosed with osteoarthritis of the knees bilaterally previously and did have steroid injection 3 weeks ago. Patient states she had about one week of improvement. Patient continues to wear the braces but unfortunately continues to have the pain worse when going from a seated to standing position as well as with ambulation. Feels better when she is resting. Patient still rates her pain about 8/10.  Patient is also complaining of bilateral thumb pain right greater than left. Patient has been pealing a lot of potatoes.   Past medical, surgical, family and social history reviewed. Medications reviewed all in the electronic medical record.   Review of Systems: No headache, visual changes, nausea, vomiting, diarrhea, constipation, dizziness, abdominal pain, skin rash, fevers, chills, night sweats, weight loss, swollen lymph nodes, body aches, joint swelling, muscle aches, chest pain, shortness of breath, mood changes.   Objective:    Blood pressure 142/84, pulse 69, SpO2 98.00%.   General: No apparent distress alert and oriented x3 mood and affect normal, dressed appropriately.  HEENT: Pupils equal, extraocular movements intact Respiratory: Patient's speak in full sentences and does not appear short of breath Cardiovascular: No lower extremity edema, non tender, no erythema Skin: Warm dry intact with no signs of infection or rash on extremities or on axial skeleton. Abdomen: Soft nontender Neuro: Cranial nerves II through XII are intact, neurovascularly intact in all extremities with 2+ DTRs and 2+ pulses. Lymph: No lymphadenopathy of posterior or anterior cervical chain or axillae bilaterally.  Gait normal with good balance and coordination.  MSK: Non tender with full range of motion and good stability and symmetric strength and tone of shoulders, hip, and ankles bilaterally.  Wrist:  Bilateral Inspection normal with no visible erythema or swelling. ROM smooth and normal with good flexion and extension and ulnar/radial deviation that is symmetrical with opposite wrist. Palpation is normal over metacarpals, navicular, lunate, and TFCC; tendons without tenderness/ swelling No snuffbox tenderness. No tenderness over Canal of Guyon. Strength 5/5 in all directions without pain. Positive Finkelstein bilaterally right greater than left,  Negative tinel's and phalens. Negative Watson's test.   Knee: Bilateral Inspection shows trace effusion bilaterally Palpation tenderness to palpation mostly of the medial joint lines bilaterally ROM full in flexion and extension and lower leg rotation. Ligaments with solid consistent endpoints including ACL, PCL, LCL, MCL. Negative Mcmurray's, Apley's, and Thessalonian tests.  painful patellar compression. Patellar glide with crepitus. Patellar and quadriceps tendons unremarkable. Hamstring and quadriceps strength is normal.      Procedure: Real-time Ultrasound Guided Injection of right abductor pollicis longus tendon Device: GE Logiq E  Ultrasound guided injection is preferred based studies that show increased duration, increased effect, greater accuracy, decreased procedural pain, increased response rate, and decreased cost with ultrasound guided versus blind injection.  Verbal informed consent obtained.  Time-out conducted.  Noted no overlying erythema, induration, or other signs of local infection.  Skin prepped in a sterile fashion.  Local anesthesia: Topical Ethyl chloride.  With sterile technique and under real time ultrasound guidance:  Patient's injection with a 26-gauge half-inch needle into the tendon sheath. This was visualized under ultrasound. Completed without difficulty  Pain immediately resolved suggesting accurate placement of the medication.  Advised to call if fevers/chills, erythema, induration, drainage, or  persistent bleeding.  Images permanently stored and available for review in the ultrasound unit.  Impression: Technically successful ultrasound guided injection.  Impression and Recommendations:     This case required medical decision making of moderate complexity.

## 2013-06-26 NOTE — Progress Notes (Signed)
Subjective:    Patient ID: Carrie White, female    DOB: Dec 30, 1940, 72 y.o.   MRN: [id number]  HPI Pt returns for f/u of insulin-requiring DM (dx'ed 2008, when she presented with polyuria; she has moderate neuropathy of the lower extremities; no known associated complications; she has never had severe hypoglycemia or DKA).  she brings a record of her cbg's which i have reviewed today.  It was 33 once, at 01:30.  Otherwise, There is no trend throughout the day. Pt says her glycemic control is being complicated by the need for frequent steroid joint injections.   Past Medical History  Diagnosis Date  . GOITER, MULTINODULAR 07/01/2010  . HYPERLIPIDEMIA 07/31/2008  . INSOMNIA, CHRONIC 06/29/2009  . RESTLESS LEG SYNDROME 05/07/2007  . HYPERTENSION 08/01/2008  . OSTEOARTHRITIS 05/07/2007  . FIBROMYALGIA 05/07/2007  . Diabetes mellitus     type II- uncontrolled  . Fibromyalgia   . Intraductal papilloma of breast 07/12/2011    Past Surgical History  Procedure Laterality Date  . Abdominal hysterectomy    . Oophorectomy    . Mouth surgery  [id number]  . Colonoscopy    . Dilation and curettage of uterus  1972  . Breast mass excision  07/18/11    left breast  . Mastectomy, partial  07/18/2011    History   Social History  . Marital Status: Married    Spouse Name: N/A    Number of Children: N/A  . Years of Education: N/A   Occupational History  . Not on file.   Social History Main Topics  . Smoking status: Former Smoker    Quit date: 07/13/1969  . Smokeless tobacco: Not on file     Comment: quit in the 70's  . Alcohol Use: No  . Drug Use: No  . Sexual Activity: Not on file   Other Topics Concern  . 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 acetaminophen-codeine (TYLENOL #3) 300-30 MG  per tablet TAKE 1 TABLET BY MOUTH EVERY MORNING  30 tablet  1  . BAYER BREEZE 2 TEST DISK TEST 2 TIMES PER DAY  100 each  7  . BD ULTRA-FINE PEN NEEDLES 29G X 12.7MM MISC USE DAILY  100 each  1  . HUMALOG KWIKPEN 100 UNIT/ML SOPN INJECT INTO THE SKIN 3 TIMES DAILY BEFORE MEALS 20-10-20 UNITS  3 mL  5  . HUMULIN N KWIKPEN 100 UNIT/ML SUPN INJECT 25 UNITS INTO THE SKIN AT BEDTIME.  15 mL  2  . Hylan 16 MG/2ML SOSY Inject 1 application into the articular space once a week. In both knees.  6 Syringe  0  . insulin lispro (HUMALOG) 100 UNIT/ML injection kwikpen; Inject into the skin 3 times daily before meals 15-10-8 units      . Insulin Pen Needle (B-D ULTRAFINE III SHORT PEN) 31G X 8 MM MISC Use as directed three times a day dx 250.02  100 each  5  . lisinopril (PRINIVIL,ZESTRIL) 10 MG tablet TAKE 1 TABLET BY MOUTH EVERY DAY  30 tablet  5  . omeprazole (PRILOSEC) 40 MG capsule TAKE ONE CAPSULE BY MOUTH EVERY DAY  30 capsule  5  . predniSONE (DELTASONE) 10 MG tablet Take 3 tablets on days 1-2, take 2 tablets on  days 3-4, take 1 tablet on days 5-6  12 tablet  0  . rOPINIRole (REQUIP) 3 MG tablet TAKE 1 TABLET (3 MG TOTAL) BY MOUTH AT BEDTIME.  30 tablet  5  . rOPINIRole (REQUIP) 3 MG tablet TAKE 1 TABLET (3 MG TOTAL) BY MOUTH AT BEDTIME.  30 tablet  2  . sertraline (ZOLOFT) 50 MG tablet Take 2 tablets (100 mg total) by mouth daily.  60 tablet  5  . simvastatin (ZOCOR) 40 MG tablet TAKE 1 TABLET BY MOUTH IN THE EVENING FOR CHOLESTEROL  30 tablet  5  . insulin NPH (HUMULIN N,NOVOLIN N) 100 UNIT/ML injection Inject 35 Units into the skin at bedtime.        No current facility-administered medications on file prior to visit.    Allergies  Allergen Reactions  . Pioglitazone     REACTION: weight gain    Family History  Problem Relation Age of Onset  . Hypertension Mother   . Stroke Mother     CVA  . Cancer Father     Colon Cancer with Mets, Lung Cancer  . Cancer Sister     Breast Cancer  .  Hypertension Brother   . Stroke Brother     CVA  . Cancer Brother     Prostate Cancer  . Cancer Sister     Breast Cancer  . Leukemia Sister   . Diabetes Sister   . Lupus Sister   . Diabetes Brother   . Thyroid disease Other     Brother (1974) thyroid surgery  . Thyroid disease Other     Sister (1981) thyroid surgery    BP 140/60  Pulse 80  Temp(Src) 97.8 F (36.6 C) (Oral)  Resp 16  Ht 5\' 2"  (1.575 m)  Wt 205 lb 9.6 oz (93.26 kg)  BMI 37.60 kg/m2  Review of Systems Denies LOC.  She has gained weight.      Objective:   Physical Exam VITAL SIGNS:  See vs page GENERAL: no distress  Lab Results  Component Value Date   HGBA1C 7.6* 06/05/2013      Assessment & Plan:  DM: This insulin regimen was chosen from multiple options, as it best matches her insulin to her changing requirements throughout the day.  The benefits of glycemic control must be weighed against the risks of hypoglycemia.  Variable cbg's prevent [place] from increasing the insulin now. OA: steroid injections complicate the rx of DM

## 2013-06-27 ENCOUNTER — Telehealth: Payer: Self-pay | Admitting: *Deleted

## 2013-06-27 DIAGNOSIS — E1049 Type 1 diabetes mellitus with other diabetic neurological complication: Secondary | ICD-10-CM | POA: Insufficient documentation

## 2013-06-27 NOTE — Telephone Encounter (Signed)
Pt called states she is unable to find the Hylan medication, states she has called 6 pharmacies and no one has it.  Please advise

## 2013-06-27 NOTE — Telephone Encounter (Signed)
Discussed with pt, adivsed her that I talked to the pharmacist at her [facility] & was told that if she brings the rx to the pharmacy they could order it. Pt understood & stated that she will take the rx to the pharmacy today& let us know if she has any problems.

## 2013-07-15 ENCOUNTER — Telehealth: Payer: Self-pay | Admitting: *Deleted

## 2013-07-15 MED ORDER — HYLAN G-F 20 16 MG/2ML IX SOSY: 1.0000 | PREFILLED_SYRINGE | INTRA_ARTICULAR | Status: DC

## 2013-07-15 NOTE — Telephone Encounter (Signed)
Yes will send in prescription but could come back and see me again, we do have the medication now as well. Will have lindsey call back and ask.  Marland Kitchen

## 2013-07-15 NOTE — Telephone Encounter (Signed)
Carrie White called states Hylan is not available.  Synvisc 16mg /76mL is an alternative.  She states she needs a new Rx.  Please advise at [phone]; option 3; ref. # [id number].

## 2013-07-16 NOTE — Telephone Encounter (Signed)
Called pharmacy & ordered Synvisc. Pharmacy will contact pt to let her know what the cost is before shipping out.  lmovm to make pt aware.

## 2013-07-16 NOTE — Telephone Encounter (Signed)
complete

## 2013-07-23 ENCOUNTER — Ambulatory Visit (INDEPENDENT_AMBULATORY_CARE_PROVIDER_SITE_OTHER): Payer: Medicare PPO | Admitting: Family Medicine

## 2013-07-23 ENCOUNTER — Encounter: Payer: Self-pay | Admitting: Family Medicine

## 2013-07-23 VITALS — BP 142/84 | HR 72

## 2013-07-23 DIAGNOSIS — M1710 Unilateral primary osteoarthritis, unspecified knee: Secondary | ICD-10-CM

## 2013-07-23 NOTE — Assessment & Plan Note (Signed)
Started injections today. Patient will return in one week. We will do this for a total of 3 injections in each knee. Patient return in one week for Synvisc #2 bilaterally.

## 2013-07-23 NOTE — Progress Notes (Signed)
Pre-visit discussion using our clinic review tool. No additional management support is needed unless otherwise documented below in the visit note.  

## 2013-07-23 NOTE — Patient Instructions (Signed)
It is good to see you COntinue the exercises daily Ice whej you get home Continue therapy

## 2013-07-23 NOTE — Progress Notes (Signed)
  CC: Followup knee pain  HPI: Patient is a very pleasant 72 year old female who is diagnosed with osteoarthritis of the knees bilaterally coming in to start Synvisc injections. Patient does bring medication with her. Patient states since have seen her knees have not made any improvement. Patient did have a steroid injection 4 weeks ago and she states that she'll he had about one week of relief. Patient continues to try to do the home exercises but once again she is recently widowed and is finding it difficult to do multiple activities around the house. Patient denies any new symptoms such as radiation or numbness in the knees.  Patient is also complaining of bilateral thumb pain right greater than left. Patient has been pealing a lot of potatoes.   Past medical, surgical, family and social history reviewed. Medications reviewed all in the electronic medical record.   Review of Systems: No headache, visual changes, nausea, vomiting, diarrhea, constipation, dizziness, abdominal pain, skin rash, fevers, chills, night sweats, weight loss, swollen lymph nodes, body aches, joint swelling, muscle aches, chest pain, shortness of breath, mood changes.   Objective:    There were no vitals taken for this visit.   General: No apparent distress alert and oriented x3 mood and affect normal, dressed appropriately.  HEENT: Pupils equal, extraocular movements intact Respiratory: Patient's speak in full sentences and does not appear short of breath Cardiovascular: No lower extremity edema, non tender, no erythema Skin: Warm dry intact with no signs of infection or rash on extremities or on axial skeleton. Abdomen: Soft nontender Neuro: Cranial nerves II through XII are intact, neurovascularly intact in all extremities with 2+ DTRs and 2+ pulses. Lymph: No lymphadenopathy of posterior or anterior cervical chain or axillae bilaterally.  Gait normal with good balance and coordination.  MSK: Non tender with full  range of motion and good stability and symmetric strength and tone of shoulders, hip, and ankles bilaterally.  Wrist: Bilateral Inspection normal with no visible erythema or swelling. ROM smooth and normal with good flexion and extension and ulnar/radial deviation that is symmetrical with opposite wrist. Palpation is normal over metacarpals, navicular, lunate, and TFCC; tendons without tenderness/ swelling No snuffbox tenderness. No tenderness over Canal of [place]. Strength 5/5 in all directions without pain. Positive Finkelstein bilaterally right greater than left,  Negative tinel's and phalens. Negative Watson's test.  2 procedure notes 16 mg/2.5 mL of Synvisc (sodium hyaluronate) in a prefilled syringe was injected easily into the knee through a 22-gauge needle. This was repeated in the right as well as the left knee  Impression and Recommendations:     This case required medical decision making of moderate complexity.

## 2013-07-31 ENCOUNTER — Ambulatory Visit (INDEPENDENT_AMBULATORY_CARE_PROVIDER_SITE_OTHER): Payer: Medicare PPO | Admitting: Family Medicine

## 2013-07-31 ENCOUNTER — Encounter: Payer: Self-pay | Admitting: Family Medicine

## 2013-07-31 VITALS — BP 100/62 | HR 74

## 2013-07-31 DIAGNOSIS — M1710 Unilateral primary osteoarthritis, unspecified knee: Secondary | ICD-10-CM

## 2013-07-31 NOTE — Patient Instructions (Signed)
One last injection to go.  See you next week Continue exercises

## 2013-07-31 NOTE — Progress Notes (Signed)
Pre-visit discussion using our clinic review tool. No additional management support is needed unless otherwise documented below in the visit note.  

## 2013-07-31 NOTE — Progress Notes (Signed)
  CC: Followup knee pain synvisc #2 bilateral  HPI: Patient is a very pleasant 72 year old female who is diagnosed with osteoarthritis of the knees bilaterally coming in for #2 and her Synvisc series. Patient does bring medication with her at previous office visit and has been stored by our office. Since last injection patient states since have seen her knees have not made any improvement.   Patient continues to try to do the home exercises but once again she is recently widowed and is finding it difficult to do multiple activities around the house. Patient denies any new symptoms such as radiation or numbness in the knees.  Patient is also complaining of bilateral thumb pain right greater than left. Patient has been pealing a lot of potatoes.   Past medical, surgical, family and social history reviewed. Medications reviewed all in the electronic medical record.   Review of Systems: No headache, visual changes, nausea, vomiting, diarrhea, constipation, dizziness, abdominal pain, skin rash, fevers, chills, night sweats, weight loss, swollen lymph nodes, body aches, joint swelling, muscle aches, chest pain, shortness of breath, mood changes.   Objective:    There were no vitals taken for this visit.   General: No apparent distress alert and oriented x3 mood and affect normal, dressed appropriately.  HEENT: Pupils equal, extraocular movements intact Respiratory: Patient's speak in full sentences and does not appear short of breath Cardiovascular: No lower extremity edema, non tender, no erythema Skin: Warm dry intact with no signs of infection or rash on extremities or on axial skeleton. Abdomen: Soft nontender Neuro: Cranial nerves II through XII are intact, neurovascularly intact in all extremities with 2+ DTRs and 2+ pulses. Lymph: No lymphadenopathy of posterior or anterior cervical chain or axillae bilaterally.  Gait normal with good balance and coordination.  MSK: Non tender with full  range of motion and good stability and symmetric strength and tone of shoulders, hip, and ankles bilaterally.    2 procedure notes 16 mg/2.5 mL of Synvisc (sodium hyaluronate) in a prefilled syringe was injected easily into the knee through a 22-gauge needle. Patient was properly sterilized with alcohol swab before injection. No blood loss encountered. This was repeated in the right as well as the left knee  Impression and Recommendations:     This case required medical decision making of moderate complexity.

## 2013-08-02 ENCOUNTER — Telehealth: Payer: Self-pay | Admitting: Internal Medicine

## 2013-08-02 NOTE — Telephone Encounter (Signed)
Yes it would  Be fine, please tell her.

## 2013-08-02 NOTE — Telephone Encounter (Signed)
Patient called wanting to know if its okay to reschedule her appointment for next to the following week  Stated its for her 3rd shot didn't know if its okay to come a week later  Please advise what can be done

## 2013-08-02 NOTE — Telephone Encounter (Signed)
Called patient and left VM stating its okay to reschedule her appointment for next wk

## 2013-08-06 ENCOUNTER — Ambulatory Visit: Payer: Medicare PPO | Admitting: Family Medicine

## 2013-08-14 ENCOUNTER — Encounter: Payer: Self-pay | Admitting: Family Medicine

## 2013-08-14 ENCOUNTER — Ambulatory Visit (INDEPENDENT_AMBULATORY_CARE_PROVIDER_SITE_OTHER): Payer: Medicare PPO | Admitting: Family Medicine

## 2013-08-14 VITALS — BP 126/66 | HR 76

## 2013-08-14 DIAGNOSIS — M1710 Unilateral primary osteoarthritis, unspecified knee: Secondary | ICD-10-CM

## 2013-08-14 NOTE — Assessment & Plan Note (Signed)
Patient now will followup in one month for further evaluation. If she continues to have pain at that time I would consider surgical intervention with likely knee replacement surgery. Patient encouraged to continue the exercises that I think given to her previously. Patient will try the other modalities that we have discussed previously. Patient will followup in one month to

## 2013-08-14 NOTE — Progress Notes (Signed)
  CC: Followup knee pain synvisc #3 bilateral  HPI: Patient is a very pleasant 72 year old female who is diagnosed with osteoarthritis of the knees bilaterally coming in for #3 of her Synvisc series. Patient does bring medication with her at previous office visit and has been stored by our office. Since last injection patient states since have seen her knees     Past medical, surgical, family and social history reviewed. Medications reviewed all in the electronic medical record.   Review of Systems: No headache, visual changes, nausea, vomiting, diarrhea, constipation, dizziness, abdominal pain, skin rash, fevers, chills, night sweats, weight loss, swollen lymph nodes, body aches, joint swelling, muscle aches, chest pain, shortness of breath, mood changes.   Objective:    There were no vitals taken for this visit.   General: No apparent distress alert and oriented x3 mood and affect normal, dressed appropriately.  HEENT: Pupils equal, extraocular movements intact Respiratory: Patient's speak in full sentences and does not appear short of breath Cardiovascular: No lower extremity edema, non tender, no erythema Skin: Warm dry intact with no signs of infection or rash on extremities or on axial skeleton. Abdomen: Soft nontender Neuro: Cranial nerves II through XII are intact, neurovascularly intact in all extremities with 2+ DTRs and 2+ pulses. Lymph: No lymphadenopathy of posterior or anterior cervical chain or axillae bilaterally.  Gait normal with good balance and coordination.  MSK: Non tender with full range of motion and good stability and symmetric strength and tone of shoulders, hip, and ankles bilaterally.    2 procedure notes 16 mg/2.5 mL of Synvisc (sodium hyaluronate) in a prefilled syringe was injected easily into the knee through a 22-gauge needle. Patient was properly sterilized with alcohol swab before injection. No blood loss encountered. This was repeated in the right as  well as the left knee  Impression and Recommendations:     This case required medical decision making of moderate complexity.

## 2013-08-14 NOTE — Patient Instructions (Signed)
Good to see you and Happy new Year! Continue to do exercises 3 times a week Ice or heat can help when needed Come back in 1 month and we will see how you are doing. Or call me in 1 week and tell me how you are doing.  I do think you need replacement at some time an dsooner the better Dr. Gean Birchwood and [facility] is who I would recommend.  [phone]

## 2013-08-19 ENCOUNTER — Telehealth: Payer: Self-pay | Admitting: *Deleted

## 2013-08-19 DIAGNOSIS — M1710 Unilateral primary osteoarthritis, unspecified knee: Secondary | ICD-10-CM

## 2013-08-19 DIAGNOSIS — M179 Osteoarthritis of knee, unspecified: Secondary | ICD-10-CM

## 2013-08-19 MED ORDER — TRAMADOL HCL 50 MG PO TABS: 50.0000 mg | ORAL_TABLET | Freq: Two times a day (BID) | ORAL | Status: DC

## 2013-08-19 NOTE — Telephone Encounter (Signed)
Pt called complaining of knee pain, requesting pain medication.  Please advise

## 2013-08-19 NOTE — Telephone Encounter (Signed)
Called patient and told her we will do tramadol and she will be referred to D.r Mayer Camel at [facility].

## 2013-08-19 NOTE — Telephone Encounter (Signed)
Pt called complaining of knee pain, requesting pain medication.

## 2013-08-19 NOTE — Telephone Encounter (Signed)
Referral entered, rx phoned into pharmacy.

## 2013-08-22 ENCOUNTER — Other Ambulatory Visit: Payer: Self-pay | Admitting: Orthopedic Surgery

## 2013-08-28 ENCOUNTER — Encounter (HOSPITAL_COMMUNITY): Payer: Self-pay | Admitting: Pharmacy Technician

## 2013-08-31 ENCOUNTER — Other Ambulatory Visit: Payer: Self-pay | Admitting: Internal Medicine

## 2013-09-02 ENCOUNTER — Encounter (HOSPITAL_COMMUNITY)
Admission: RE | Admit: 2013-09-02 | Discharge: 2013-09-02 | Disposition: A | Discharge status: Home / Self Care | Payer: Medicare PPO | Source: Ambulatory Visit | Attending: Orthopedic Surgery | Admitting: Orthopedic Surgery

## 2013-09-02 ENCOUNTER — Ambulatory Visit (HOSPITAL_COMMUNITY)
Admission: RE | Admit: 2013-09-02 | Discharge: 2013-09-02 | Disposition: A | Discharge status: Home / Self Care | Payer: Medicare PPO | Source: Ambulatory Visit | Attending: Orthopedic Surgery | Admitting: Orthopedic Surgery

## 2013-09-02 ENCOUNTER — Encounter (HOSPITAL_COMMUNITY): Payer: Self-pay

## 2013-09-02 DIAGNOSIS — I10 Essential (primary) hypertension: Secondary | ICD-10-CM | POA: Insufficient documentation

## 2013-09-02 DIAGNOSIS — E119 Type 2 diabetes mellitus without complications: Secondary | ICD-10-CM | POA: Insufficient documentation

## 2013-09-02 DIAGNOSIS — Z01818 Encounter for other preprocedural examination: Secondary | ICD-10-CM | POA: Insufficient documentation

## 2013-09-02 DIAGNOSIS — Z01812 Encounter for preprocedural laboratory examination: Secondary | ICD-10-CM | POA: Insufficient documentation

## 2013-09-02 DIAGNOSIS — Z794 Long term (current) use of insulin: Secondary | ICD-10-CM | POA: Insufficient documentation

## 2013-09-02 DIAGNOSIS — E785 Hyperlipidemia, unspecified: Secondary | ICD-10-CM | POA: Insufficient documentation

## 2013-09-02 DIAGNOSIS — I517 Cardiomegaly: Secondary | ICD-10-CM | POA: Insufficient documentation

## 2013-09-02 DIAGNOSIS — E669 Obesity, unspecified: Secondary | ICD-10-CM | POA: Insufficient documentation

## 2013-09-02 DIAGNOSIS — Z01810 Encounter for preprocedural cardiovascular examination: Secondary | ICD-10-CM | POA: Insufficient documentation

## 2013-09-02 HISTORY — DX: Adverse effect of unspecified anesthetic, initial encounter: T41.45XA

## 2013-09-02 HISTORY — DX: Other complications of anesthesia, initial encounter: T88.59XA

## 2013-09-02 HISTORY — DX: Major depressive disorder, single episode, unspecified: F32.9

## 2013-09-02 HISTORY — DX: Other specified postprocedural states: R11.2

## 2013-09-02 HISTORY — DX: Depression, unspecified: F32.A

## 2013-09-02 HISTORY — DX: Gastro-esophageal reflux disease without esophagitis: K21.9

## 2013-09-02 HISTORY — DX: Other specified postprocedural states: Z98.890

## 2013-09-02 LAB — BASIC METABOLIC PANEL
BUN: 18 mg/dL (ref 6–23)
CO2: 29 meq/L (ref 19–32)
Calcium: 9.3 mg/dL (ref 8.4–10.5)
Chloride: 101 mEq/L (ref 96–112)
Creatinine, Ser: 0.65 mg/dL (ref 0.50–1.10)
GFR calc Af Amer: >90 mL/min (ref >90)
GFR calc non Af Amer: 87 mL/min — ABNORMAL LOW (ref >90)
Glucose, Bld: 164 mg/dL — ABNORMAL HIGH (ref 70–99)
POTASSIUM: 4.8 meq/L (ref 3.7–5.3)
SODIUM: 142 meq/L (ref 137–147)

## 2013-09-02 LAB — CBC WITH DIFFERENTIAL/PLATELET
Basophils Absolute: 0 10*3/uL (ref 0.0–0.1)
Basophils Relative: 0 % (ref 0–1)
Eosinophils Absolute: 0.1 10*3/uL (ref 0.0–0.7)
Eosinophils Relative: 2 % (ref 0–5)
HCT: 38 % (ref 36.0–46.0)
Hemoglobin: 12.8 g/dL (ref 12.0–15.0)
LYMPHS PCT: 29 % (ref 12–46)
Lymphs Abs: 2.5 10*3/uL (ref 0.7–4.0)
MCH: 30.6 pg (ref 26.0–34.0)
MCHC: 33.7 g/dL (ref 30.0–36.0)
MCV: 90.9 fL (ref 78.0–100.0)
Monocytes Absolute: 0.6 10*3/uL (ref 0.1–1.0)
Monocytes Relative: 7 % (ref 3–12)
Neutro Abs: 5.4 10*3/uL (ref 1.7–7.7)
Neutrophils Relative %: 62 % (ref 43–77)
PLATELETS: 213 10*3/uL (ref 150–400)
RBC: 4.18 MIL/uL (ref 3.87–5.11)
RDW: 14.1 % (ref 11.5–15.5)
WBC: 8.6 10*3/uL (ref 4.0–10.5)

## 2013-09-02 LAB — URINALYSIS, ROUTINE W REFLEX MICROSCOPIC
BILIRUBIN URINE: NEGATIVE
Glucose, UA: NEGATIVE mg/dL
Hgb urine dipstick: NEGATIVE
Ketones, ur: NEGATIVE mg/dL
Leukocytes, UA: NEGATIVE
NITRITE: NEGATIVE
Protein, ur: 30 mg/dL — AB
Specific Gravity, Urine: 1.029 (ref 1.005–1.030)
UROBILINOGEN UA: 0.2 mg/dL (ref 0.0–1.0)
pH: 6 (ref 5.0–8.0)

## 2013-09-02 LAB — ABO/RH: ABO/RH(D): A POS

## 2013-09-02 LAB — PROTIME-INR
INR: 0.94 (ref 0.00–1.49)
PROTHROMBIN TIME: 12.4 s (ref 11.6–15.2)

## 2013-09-02 LAB — URINE MICROSCOPIC-ADD ON

## 2013-09-02 LAB — TYPE AND SCREEN
ABO/RH(D): A POS
Antibody Screen: NEGATIVE

## 2013-09-02 LAB — SURGICAL PCR SCREEN
MRSA, PCR: NEGATIVE
Staphylococcus aureus: NEGATIVE

## 2013-09-02 LAB — APTT: APTT: 25 s (ref 24–37)

## 2013-09-02 NOTE — Pre-Procedure Instructions (Signed)
Carrie White  09/02/2013   Your procedure is scheduled on:  Monday, January 26.  Report to [facility], Main Entrance Entrance "A" at 8:15AM.  Call this number if you have problems the morning of surgery: [phone]   Remember:   Do not eat food or drink liquids after midnight Sunday.  Take these medicines the morning of surgery with A SIP OF WATER: omeprazole (PRILOSEC), sertraline (ZOLOFT).  May use inhaler and bring it to the hospital with you.  Do not take any diabetic medication the morning of surgery.  May take pain medication if needed.    Do not wear jewelry, make-up or nail polish.  Do not wear lotions, powders, or perfumes. You may wear deodorant.  Do not shave 48 hours prior to surgery.   Do not bring valuables to the hospital.  [facility] is not responsible for any belongings or valuables.               Contacts, dentures or bridgework may not be worn into surgery.  Leave suitcase in the car. After surgery it may be brought to your room.  For patients admitted to the hospital, discharge time is determined by your treatment team.                Special Instructions: Shower using CHG 2 nights before surgery and the night before surgery.  If you shower the day of surgery use CHG.  Use special wash - you have one bottle of CHG for all showers.  You should use approximately 1/3 of the bottle for each shower.   Please read over the following fact sheets that you were given: Pain Booklet, Coughing and Deep Breathing and Surgical Site Infection Prevention

## 2013-09-03 NOTE — Progress Notes (Signed)
Anesthesia Chart Review:  Patient is a 73 year old female scheduled for left TKA on 09/09/13 by Dr. Mayer Camel.  History includes former smoker, post-operative N/V, obesity, multi-nodular goiter, DM2 on insulin, HTN, HLD, fibromyalgia, GERD, depression, RLS, intraductal papilloma s/p left breast lumpectomy 07/2011. PCP is Dr. Adella Hare.  Endocrinologist is Dr. Renato Shin.  EKG on 09/02/13 showed NSR, poor anterior r wave progression.  No significant change since last tracing on 07/15/11.  Preoperative CXR and labs noted.  If no acute changes then I anticipate that she can proceed as planned.  [facility] Stay [facility]/Anesthesiology Phone [phone] 09/03/2013 1:09 PM

## 2013-09-04 ENCOUNTER — Ambulatory Visit: Payer: Medicare PPO | Admitting: Internal Medicine

## 2013-09-06 NOTE — H&P (Signed)
TOTAL KNEE ADMISSION H&P  Patient is being admitted for left total knee arthroplasty.  Subjective:  Chief Complaint:left knee pain.  HPI: Carrie White, 73 y.o. female, has a history of pain and functional disability in the left knee due to arthritis and has failed non-surgical conservative treatments for greater than 12 weeks to includeNSAID's and/or analgesics, flexibility and strengthening excercises, use of assistive devices and activity modification.  Onset of symptoms was gradual, starting several years ago with gradually worsening course since that time. The patient noted no past surgery on the left knee(s).  Patient currently rates pain in the left knee(s) at 10 out of 10 with activity. Patient has worsening of pain with activity and weight bearing, pain that interferes with activities of daily living, pain with passive range of motion and joint swelling.  Patient has evidence of subchondral sclerosis and joint space narrowing by imaging studies.  There is no active infection.  Patient Active Problem List   Diagnosis Date Noted  . Type I (juvenile type) diabetes mellitus with neurological manifestations, not stated as uncontrolled(250.61) 06/27/2013  . De Quervain's tenosynovitis, bilateral 06/26/2013  . Osteoarthritis, knee 06/05/2013  . Lateral epicondylitis (tennis elbow) 06/05/2013  . Routine health maintenance 10/23/2012  . Grief 10/22/2012  . GOITER, MULTINODULAR 07/01/2010  . DYSURIA 10/30/2009  . VAGINITIS, CANDIDAL 10/28/2009  . DIARRHEA, PERSISTENT 09/25/2009  . INSOMNIA, CHRONIC 06/29/2009  . UNS ADVRS EFF UNS RX MEDICINAL&BIOLOGICAL SBSTNC 02/24/2009  . DIABETES MELLITUS, TYPE II, UNCONTROLLED 08/01/2008  . HYPERTENSION 08/01/2008  . HYPERLIPIDEMIA 07/31/2008  . RESTLESS LEG SYNDROME 05/07/2007  . OSTEOARTHRITIS 05/07/2007  . FIBROMYALGIA 05/07/2007   Past Medical History  Diagnosis Date  . GOITER, MULTINODULAR 07/01/2010  . HYPERLIPIDEMIA 07/31/2008  .  INSOMNIA, CHRONIC 06/29/2009  . RESTLESS LEG SYNDROME 05/07/2007  . HYPERTENSION 08/01/2008  . OSTEOARTHRITIS 05/07/2007  . FIBROMYALGIA 05/07/2007  . Diabetes mellitus     type II- uncontrolled  . Fibromyalgia   . Intraductal papilloma of breast 07/12/2011  . Complication of anesthesia   . PONV (postoperative nausea and vomiting)   . Depression   . GERD (gastroesophageal reflux disease)     Past Surgical History  Procedure Laterality Date  . Abdominal hysterectomy    . Oophorectomy    . Mouth surgery  [id number]  . Colonoscopy    . Dilation and curettage of uterus  1972  . Breast mass excision  07/18/11    left breast  . Mastectomy, partial  07/18/2011  . Breast surgery      No prescriptions prior to admission   No Known Allergies  History  Substance Use Topics  . Smoking status: Former Smoker    Types: Cigarettes    Quit date: 07/13/1969  . Smokeless tobacco: Not on file     Comment: quit in the 70's;never smoked daily  . Alcohol Use: No    Family History  Problem Relation Age of Onset  . Hypertension Mother   . Stroke Mother     CVA  . Cancer Father     Colon Cancer with Mets, Lung Cancer  . Cancer Sister     Breast Cancer  . Hypertension Brother   . Stroke Brother     CVA  . Cancer Brother     Prostate Cancer  . Cancer Sister     Breast Cancer  . Leukemia Sister   . Diabetes Sister   . Lupus Sister   . Diabetes Brother   . Thyroid disease Other  Brother [phone]) thyroid surgery  . Thyroid disease Other     Sister (1981) thyroid surgery     Review of Systems  Constitutional: Negative.   HENT: Negative.   Eyes: Negative.   Respiratory: Positive for cough.        Asthma  Cardiovascular: Negative.   Gastrointestinal: Negative.   Genitourinary: Negative.   Musculoskeletal: Positive for joint pain.  Skin: Negative.   Neurological: Negative.   Endo/Heme/Allergies: Negative.   Psychiatric/Behavioral: Negative.     Objective:  Physical Exam   Constitutional: She is oriented to person, place, and time. She appears well-developed and well-nourished.  HENT:  Head: Normocephalic and atraumatic.  Eyes: Pupils are equal, round, and reactive to light.  Neck: Normal range of motion. Neck supple.  Cardiovascular: Intact distal pulses.   Respiratory: Effort normal.  Musculoskeletal: She exhibits tenderness.  The patient's left knee has good strength but does have limited range of motion from 0-100.  Patient's right knee also has limited range of motion from 0-105.  No noticeable instability.  She does have obvious medial joint line tenderness worse in the left than the right.  No noticeable effusion no erythema or warmth.  Patient's right knee does have increased pain with McMurray's test.  No noticeable instability.  Her calves are soft and nontender.  She is neurovascularly intact distally.  Neurological: She is alert and oriented to person, place, and time.  Skin: Skin is dry.  Psychiatric: She has a normal mood and affect. Judgment and thought content normal.    Vital signs in last 24 hours:    Labs:   Estimated body mass index is 37.60 kg/(m^2) as calculated from the following:   Height as of 06/26/13: 5\' 2"  (1.575 m).   Weight as of 06/26/13: 93.26 kg (205 lb 9.6 oz).   Imaging Review X-rays: AP and lateral views of bilateral knees are reviewed in office today.  Patient does have medial joint space narrowing bilaterally.  Patient's left knee does have near bone-on-bone medial compartment collapse.  Assessment/Plan:  End stage arthritis, left knee   The patient history, physical examination, clinical judgment of the provider and imaging studies are consistent with end stage degenerative joint disease of the left knee(s) and total knee arthroplasty is deemed medically necessary. The treatment options including medical management, injection therapy arthroscopy and arthroplasty were discussed at length. The risks and benefits  of total knee arthroplasty were presented and reviewed. The risks due to aseptic loosening, infection, stiffness, patella tracking problems, thromboembolic complications and other imponderables were discussed. The patient acknowledged the explanation, agreed to proceed with the plan and consent was signed. Patient is being admitted for inpatient treatment for surgery, pain control, PT, OT, prophylactic antibiotics, VTE prophylaxis, progressive ambulation and ADL's and discharge planning. The patient is planning to be discharged to skilled nursing facility

## 2013-09-08 MED ORDER — DEXTROSE-NACL 5-0.45 % IV SOLN: INTRAVENOUS | Status: DC

## 2013-09-08 MED ORDER — CEFAZOLIN SODIUM-DEXTROSE 2-3 GM-% IV SOLR
2.0000 g | INTRAVENOUS | Status: AC
  Administered 2013-09-09: 2 g via INTRAVENOUS
  Filled 2013-09-08: qty 50

## 2013-09-08 MED ORDER — CHLORHEXIDINE GLUCONATE 4 % EX LIQD: 60.0000 mL | Freq: Once | CUTANEOUS | Status: DC

## 2013-09-09 ENCOUNTER — Encounter (HOSPITAL_COMMUNITY): Payer: Medicare PPO | Admitting: Vascular Surgery

## 2013-09-09 ENCOUNTER — Inpatient Hospital Stay (HOSPITAL_COMMUNITY): Payer: Medicare PPO | Admitting: Certified Registered"

## 2013-09-09 ENCOUNTER — Encounter (HOSPITAL_COMMUNITY)
Admission: RE | Disposition: A | Discharge status: Skilled Nursing Facility | Payer: Self-pay | Source: Ambulatory Visit | Attending: Orthopedic Surgery

## 2013-09-09 ENCOUNTER — Inpatient Hospital Stay (HOSPITAL_COMMUNITY): Payer: Medicare PPO

## 2013-09-09 ENCOUNTER — Encounter (HOSPITAL_COMMUNITY): Payer: Self-pay | Admitting: Certified Registered"

## 2013-09-09 ENCOUNTER — Inpatient Hospital Stay (HOSPITAL_COMMUNITY)
Admission: RE | Admit: 2013-09-09 | Discharge: 2013-09-12 | DRG: 470 | Disposition: A | Discharge status: Skilled Nursing Facility | Payer: Medicare PPO | Source: Ambulatory Visit | Attending: Orthopedic Surgery | Admitting: Orthopedic Surgery

## 2013-09-09 DIAGNOSIS — R110 Nausea: Secondary | ICD-10-CM | POA: Diagnosis not present

## 2013-09-09 DIAGNOSIS — Z7982 Long term (current) use of aspirin: Secondary | ICD-10-CM

## 2013-09-09 DIAGNOSIS — K219 Gastro-esophageal reflux disease without esophagitis: Secondary | ICD-10-CM | POA: Diagnosis present

## 2013-09-09 DIAGNOSIS — Z87891 Personal history of nicotine dependence: Secondary | ICD-10-CM

## 2013-09-09 DIAGNOSIS — Z79899 Other long term (current) drug therapy: Secondary | ICD-10-CM

## 2013-09-09 DIAGNOSIS — E785 Hyperlipidemia, unspecified: Secondary | ICD-10-CM | POA: Diagnosis present

## 2013-09-09 DIAGNOSIS — Z800 Family history of malignant neoplasm of digestive organs: Secondary | ICD-10-CM

## 2013-09-09 DIAGNOSIS — F329 Major depressive disorder, single episode, unspecified: Secondary | ICD-10-CM | POA: Diagnosis present

## 2013-09-09 DIAGNOSIS — Z801 Family history of malignant neoplasm of trachea, bronchus and lung: Secondary | ICD-10-CM

## 2013-09-09 DIAGNOSIS — M1710 Unilateral primary osteoarthritis, unspecified knee: Principal | ICD-10-CM | POA: Diagnosis present

## 2013-09-09 DIAGNOSIS — I10 Essential (primary) hypertension: Secondary | ICD-10-CM | POA: Diagnosis present

## 2013-09-09 DIAGNOSIS — Z6837 Body mass index (BMI) 37.0-37.9, adult: Secondary | ICD-10-CM

## 2013-09-09 DIAGNOSIS — F3289 Other specified depressive episodes: Secondary | ICD-10-CM | POA: Diagnosis present

## 2013-09-09 DIAGNOSIS — Z823 Family history of stroke: Secondary | ICD-10-CM

## 2013-09-09 DIAGNOSIS — G4700 Insomnia, unspecified: Secondary | ICD-10-CM | POA: Diagnosis present

## 2013-09-09 DIAGNOSIS — E042 Nontoxic multinodular goiter: Secondary | ICD-10-CM | POA: Diagnosis present

## 2013-09-09 DIAGNOSIS — Z9010 Acquired absence of unspecified breast and nipple: Secondary | ICD-10-CM

## 2013-09-09 DIAGNOSIS — E669 Obesity, unspecified: Secondary | ICD-10-CM | POA: Diagnosis present

## 2013-09-09 DIAGNOSIS — G2581 Restless legs syndrome: Secondary | ICD-10-CM | POA: Diagnosis present

## 2013-09-09 DIAGNOSIS — Z833 Family history of diabetes mellitus: Secondary | ICD-10-CM

## 2013-09-09 DIAGNOSIS — Z803 Family history of malignant neoplasm of breast: Secondary | ICD-10-CM

## 2013-09-09 DIAGNOSIS — Z8249 Family history of ischemic heart disease and other diseases of the circulatory system: Secondary | ICD-10-CM

## 2013-09-09 DIAGNOSIS — M1712 Unilateral primary osteoarthritis, left knee: Secondary | ICD-10-CM | POA: Diagnosis present

## 2013-09-09 DIAGNOSIS — Z794 Long term (current) use of insulin: Secondary | ICD-10-CM

## 2013-09-09 DIAGNOSIS — Z8042 Family history of malignant neoplasm of prostate: Secondary | ICD-10-CM

## 2013-09-09 DIAGNOSIS — E1165 Type 2 diabetes mellitus with hyperglycemia: Secondary | ICD-10-CM

## 2013-09-09 DIAGNOSIS — Z806 Family history of leukemia: Secondary | ICD-10-CM

## 2013-09-09 DIAGNOSIS — IMO0001 Reserved for inherently not codable concepts without codable children: Secondary | ICD-10-CM | POA: Diagnosis present

## 2013-09-09 HISTORY — PX: TOTAL KNEE ARTHROPLASTY: SHX125

## 2013-09-09 LAB — GLUCOSE, CAPILLARY
Glucose-Capillary: 122 mg/dL — ABNORMAL HIGH (ref 70–99)
Glucose-Capillary: 154 mg/dL — ABNORMAL HIGH (ref 70–99)
Glucose-Capillary: 245 mg/dL — ABNORMAL HIGH (ref 70–99)
Glucose-Capillary: 272 mg/dL — ABNORMAL HIGH (ref 70–99)

## 2013-09-09 SURGERY — ARTHROPLASTY, KNEE, TOTAL
Anesthesia: General | Site: Knee | Laterality: Left

## 2013-09-09 MED ORDER — ROCURONIUM BROMIDE 50 MG/5ML IV SOLN
INTRAVENOUS | Status: AC
  Filled 2013-09-09: qty 1

## 2013-09-09 MED ORDER — DEXAMETHASONE SODIUM PHOSPHATE 4 MG/ML IJ SOLN
INTRAMUSCULAR | Status: DC | PRN
  Administered 2013-09-09: 4 mg via INTRAVENOUS

## 2013-09-09 MED ORDER — ONDANSETRON HCL 4 MG/2ML IJ SOLN
4.0000 mg | Freq: Four times a day (QID) | INTRAMUSCULAR | Status: DC | PRN
  Administered 2013-09-10: 4 mg via INTRAVENOUS
  Filled 2013-09-09: qty 2

## 2013-09-09 MED ORDER — MENTHOL 3 MG MT LOZG
1.0000 | LOZENGE | OROMUCOSAL | Status: DC | PRN
  Filled 2013-09-09 (×2): qty 9

## 2013-09-09 MED ORDER — LIDOCAINE HCL 1 % IJ SOLN
INTRAMUSCULAR | Status: DC | PRN
  Administered 2013-09-09: 1 mL via INTRADERMAL

## 2013-09-09 MED ORDER — KCL IN DEXTROSE-NACL 20-5-0.45 MEQ/L-%-% IV SOLN
INTRAVENOUS | Status: AC
  Filled 2013-09-09: qty 1000

## 2013-09-09 MED ORDER — ALUM & MAG HYDROXIDE-SIMETH 200-200-20 MG/5ML PO SUSP: 30.0000 mL | ORAL | Status: DC | PRN

## 2013-09-09 MED ORDER — ROPINIROLE HCL 1 MG PO TABS
3.0000 mg | ORAL_TABLET | Freq: Every day | ORAL | Status: DC
Start: 2013-09-09 — End: 2013-09-12
  Administered 2013-09-09 – 2013-09-11 (×3): 3 mg via ORAL
  Filled 2013-09-09 (×4): qty 3

## 2013-09-09 MED ORDER — OXYCODONE HCL 5 MG PO TABS
5.0000 mg | ORAL_TABLET | Freq: Once | ORAL | Status: AC | PRN
  Administered 2013-09-09: 5 mg via ORAL

## 2013-09-09 MED ORDER — TRANEXAMIC ACID 100 MG/ML IV SOLN
1000.0000 mg | INTRAVENOUS | Status: AC
  Administered 2013-09-09: 1000 mg via INTRAVENOUS
  Filled 2013-09-09: qty 10

## 2013-09-09 MED ORDER — OXYCODONE HCL 5 MG/5ML PO SOLN: 5.0000 mg | Freq: Once | ORAL | Status: AC | PRN

## 2013-09-09 MED ORDER — ONDANSETRON HCL 4 MG/2ML IJ SOLN
INTRAMUSCULAR | Status: DC | PRN
  Administered 2013-09-09: 4 mg via INTRAVENOUS

## 2013-09-09 MED ORDER — ONDANSETRON HCL 4 MG/2ML IJ SOLN
INTRAMUSCULAR | Status: AC
  Filled 2013-09-09: qty 2

## 2013-09-09 MED ORDER — 0.9 % SODIUM CHLORIDE (POUR BTL) OPTIME
TOPICAL | Status: DC | PRN
  Administered 2013-09-09: 1000 mL

## 2013-09-09 MED ORDER — DEXMEDETOMIDINE HCL IN NACL 200 MCG/50ML IV SOLN
0.4000 ug/kg/h | INTRAVENOUS | Status: AC
  Administered 2013-09-09: 65.6 ug/h via INTRAVENOUS
  Filled 2013-09-09: qty 50

## 2013-09-09 MED ORDER — FENTANYL CITRATE 0.05 MG/ML IJ SOLN
INTRAMUSCULAR | Status: AC
  Filled 2013-09-09: qty 5

## 2013-09-09 MED ORDER — ACETAMINOPHEN 325 MG PO TABS
650.0000 mg | ORAL_TABLET | Freq: Four times a day (QID) | ORAL | Status: DC | PRN
  Administered 2013-09-12: 650 mg via ORAL
  Filled 2013-09-09: qty 2

## 2013-09-09 MED ORDER — PHENOL 1.4 % MT LIQD
1.0000 | OROMUCOSAL | Status: DC | PRN
  Administered 2013-09-12: 1 via OROMUCOSAL
  Filled 2013-09-09: qty 177

## 2013-09-09 MED ORDER — ONDANSETRON HCL 4 MG PO TABS: 4.0000 mg | ORAL_TABLET | Freq: Four times a day (QID) | ORAL | Status: DC | PRN

## 2013-09-09 MED ORDER — DIPHENHYDRAMINE HCL 12.5 MG/5ML PO ELIX: 12.5000 mg | ORAL_SOLUTION | ORAL | Status: DC | PRN

## 2013-09-09 MED ORDER — ASPIRIN EC 325 MG PO TBEC
325.0000 mg | DELAYED_RELEASE_TABLET | Freq: Every day | ORAL | Status: DC
  Administered 2013-09-10 – 2013-09-12 (×3): 325 mg via ORAL
  Filled 2013-09-09 (×4): qty 1

## 2013-09-09 MED ORDER — INSULIN ASPART 100 UNIT/ML ~~LOC~~ SOLN
4.0000 [IU] | Freq: Three times a day (TID) | SUBCUTANEOUS | Status: DC
  Administered 2013-09-10 – 2013-09-12 (×9): 4 [IU] via SUBCUTANEOUS

## 2013-09-09 MED ORDER — MIDAZOLAM HCL 5 MG/ML IJ SOLN
2.0000 mg | Freq: Once | INTRAMUSCULAR | Status: AC
  Filled 2013-09-09: qty 1

## 2013-09-09 MED ORDER — SODIUM CHLORIDE 0.9 % IR SOLN
Status: DC | PRN
  Administered 2013-09-09: 3000 mL

## 2013-09-09 MED ORDER — HYDROMORPHONE HCL PF 1 MG/ML IJ SOLN
1.0000 mg | INTRAMUSCULAR | Status: DC | PRN
  Administered 2013-09-09 – 2013-09-11 (×7): 1 mg via INTRAVENOUS
  Filled 2013-09-09 (×7): qty 1

## 2013-09-09 MED ORDER — SENNOSIDES-DOCUSATE SODIUM 8.6-50 MG PO TABS: 1.0000 | ORAL_TABLET | Freq: Every evening | ORAL | Status: DC | PRN

## 2013-09-09 MED ORDER — METOCLOPRAMIDE HCL 5 MG PO TABS
5.0000 mg | ORAL_TABLET | Freq: Three times a day (TID) | ORAL | Status: DC | PRN
  Filled 2013-09-09: qty 2

## 2013-09-09 MED ORDER — HYDROMORPHONE HCL PF 1 MG/ML IJ SOLN
0.2500 mg | INTRAMUSCULAR | Status: DC | PRN
  Administered 2013-09-09 (×4): 0.5 mg via INTRAVENOUS

## 2013-09-09 MED ORDER — KCL IN DEXTROSE-NACL 20-5-0.45 MEQ/L-%-% IV SOLN
INTRAVENOUS | Status: DC
  Administered 2013-09-09 – 2013-09-10 (×4): via INTRAVENOUS
  Filled 2013-09-09 (×11): qty 1000

## 2013-09-09 MED ORDER — PROPOFOL 10 MG/ML IV BOLUS
INTRAVENOUS | Status: DC | PRN
  Administered 2013-09-09: 10 mg via INTRAVENOUS

## 2013-09-09 MED ORDER — ROPIVACAINE HCL 5 MG/ML IJ SOLN
INTRAMUSCULAR | Status: DC | PRN
  Administered 2013-09-09: 25 mL via PERINEURAL

## 2013-09-09 MED ORDER — CEFUROXIME SODIUM 1.5 G IJ SOLR
INTRAMUSCULAR | Status: DC | PRN
  Administered 2013-09-09: 1.5 g

## 2013-09-09 MED ORDER — LACTATED RINGERS IV SOLN: Freq: Once | INTRAVENOUS | Status: DC

## 2013-09-09 MED ORDER — PANTOPRAZOLE SODIUM 40 MG PO TBEC
80.0000 mg | DELAYED_RELEASE_TABLET | Freq: Every day | ORAL | Status: DC
  Administered 2013-09-10 – 2013-09-12 (×3): 80 mg via ORAL
  Filled 2013-09-09 (×3): qty 2

## 2013-09-09 MED ORDER — SODIUM CHLORIDE 0.9 % IJ SOLN
INTRAMUSCULAR | Status: DC | PRN
  Administered 2013-09-09: 11:00:00

## 2013-09-09 MED ORDER — PROPOFOL 10 MG/ML IV BOLUS
INTRAVENOUS | Status: AC
  Filled 2013-09-09: qty 20

## 2013-09-09 MED ORDER — METHOCARBAMOL 100 MG/ML IJ SOLN
500.0000 mg | Freq: Four times a day (QID) | INTRAVENOUS | Status: DC | PRN
  Filled 2013-09-09: qty 5

## 2013-09-09 MED ORDER — METOCLOPRAMIDE HCL 5 MG/ML IJ SOLN
10.0000 mg | Freq: Once | INTRAMUSCULAR | Status: DC | PRN
Start: 2013-09-09 — End: 2013-09-09

## 2013-09-09 MED ORDER — METHOCARBAMOL 500 MG PO TABS
500.0000 mg | ORAL_TABLET | Freq: Four times a day (QID) | ORAL | Status: DC | PRN
  Administered 2013-09-09 – 2013-09-12 (×7): 500 mg via ORAL
  Filled 2013-09-09 (×6): qty 1

## 2013-09-09 MED ORDER — ALBUTEROL SULFATE (2.5 MG/3ML) 0.083% IN NEBU: 3.0000 mL | INHALATION_SOLUTION | Freq: Four times a day (QID) | RESPIRATORY_TRACT | Status: DC | PRN

## 2013-09-09 MED ORDER — LACTATED RINGERS IV SOLN
Freq: Once | INTRAVENOUS | Status: AC
  Administered 2013-09-09 (×2): via INTRAVENOUS

## 2013-09-09 MED ORDER — SODIUM CHLORIDE 0.9 % IJ SOLN
10.0000 mL | INTRAMUSCULAR | Status: DC | PRN
  Administered 2013-09-09 – 2013-09-11 (×4): 10 mL

## 2013-09-09 MED ORDER — FENTANYL CITRATE 0.05 MG/ML IJ SOLN
50.0000 ug | Freq: Once | INTRAMUSCULAR | Status: AC
  Administered 2013-09-09: 50 ug via INTRAVENOUS

## 2013-09-09 MED ORDER — MIDAZOLAM HCL 2 MG/2ML IJ SOLN
INTRAMUSCULAR | Status: AC
  Filled 2013-09-09: qty 2

## 2013-09-09 MED ORDER — NEOSTIGMINE METHYLSULFATE 1 MG/ML IJ SOLN
INTRAMUSCULAR | Status: DC | PRN
  Administered 2013-09-09: 3 mg via INTRAVENOUS

## 2013-09-09 MED ORDER — MIDAZOLAM HCL 2 MG/2ML IJ SOLN
INTRAMUSCULAR | Status: AC
  Administered 2013-09-09: 2 mg
  Filled 2013-09-09: qty 2

## 2013-09-09 MED ORDER — OXYCODONE HCL 5 MG PO TABS
5.0000 mg | ORAL_TABLET | ORAL | Status: DC | PRN
  Administered 2013-09-10 – 2013-09-12 (×11): 10 mg via ORAL
  Filled 2013-09-09 (×12): qty 2

## 2013-09-09 MED ORDER — MIDAZOLAM HCL 2 MG/2ML IJ SOLN
1.0000 mg | Freq: Once | INTRAMUSCULAR | Status: AC
  Administered 2013-09-09: 1 mg via INTRAVENOUS

## 2013-09-09 MED ORDER — FENTANYL CITRATE 0.05 MG/ML IJ SOLN
INTRAMUSCULAR | Status: DC | PRN
  Administered 2013-09-09 (×2): 50 ug via INTRAVENOUS
  Administered 2013-09-09 (×2): 100 ug via INTRAVENOUS

## 2013-09-09 MED ORDER — METOCLOPRAMIDE HCL 5 MG/ML IJ SOLN
INTRAMUSCULAR | Status: DC | PRN
  Administered 2013-09-09: 10 mg via INTRAVENOUS

## 2013-09-09 MED ORDER — ACETAMINOPHEN 650 MG RE SUPP: 650.0000 mg | Freq: Four times a day (QID) | RECTAL | Status: DC | PRN

## 2013-09-09 MED ORDER — INSULIN ASPART 100 UNIT/ML ~~LOC~~ SOLN
0.0000 [IU] | Freq: Three times a day (TID) | SUBCUTANEOUS | Status: DC
  Administered 2013-09-10: 3 [IU] via SUBCUTANEOUS
  Administered 2013-09-10: 5 [IU] via SUBCUTANEOUS
  Administered 2013-09-10: 8 [IU] via SUBCUTANEOUS
  Administered 2013-09-11 (×3): 5 [IU] via SUBCUTANEOUS
  Administered 2013-09-12: 2 [IU] via SUBCUTANEOUS
  Administered 2013-09-12: 3 [IU] via SUBCUTANEOUS
  Administered 2013-09-12: 5 [IU] via SUBCUTANEOUS

## 2013-09-09 MED ORDER — DOCUSATE SODIUM 100 MG PO CAPS
100.0000 mg | ORAL_CAPSULE | Freq: Two times a day (BID) | ORAL | Status: DC
  Administered 2013-09-09 – 2013-09-12 (×6): 100 mg via ORAL
  Filled 2013-09-09 (×8): qty 1

## 2013-09-09 MED ORDER — SERTRALINE HCL 100 MG PO TABS
100.0000 mg | ORAL_TABLET | Freq: Every day | ORAL | Status: DC
  Administered 2013-09-10 – 2013-09-12 (×3): 100 mg via ORAL
  Filled 2013-09-09 (×3): qty 1

## 2013-09-09 MED ORDER — METOCLOPRAMIDE HCL 5 MG/ML IJ SOLN
INTRAMUSCULAR | Status: AC
  Filled 2013-09-09: qty 2

## 2013-09-09 MED ORDER — METHOCARBAMOL 500 MG PO TABS
ORAL_TABLET | ORAL | Status: AC
  Filled 2013-09-09: qty 1

## 2013-09-09 MED ORDER — ROCURONIUM BROMIDE 100 MG/10ML IV SOLN
INTRAVENOUS | Status: DC | PRN
  Administered 2013-09-09: 50 mg via INTRAVENOUS

## 2013-09-09 MED ORDER — BISACODYL 5 MG PO TBEC: 5.0000 mg | DELAYED_RELEASE_TABLET | Freq: Every day | ORAL | Status: DC | PRN

## 2013-09-09 MED ORDER — ZOLPIDEM TARTRATE 5 MG PO TABS
5.0000 mg | ORAL_TABLET | Freq: Every evening | ORAL | Status: DC | PRN
  Administered 2013-09-10: 5 mg via ORAL
  Filled 2013-09-09: qty 1

## 2013-09-09 MED ORDER — LISINOPRIL 10 MG PO TABS
10.0000 mg | ORAL_TABLET | Freq: Every day | ORAL | Status: DC
  Administered 2013-09-09 – 2013-09-12 (×4): 10 mg via ORAL
  Filled 2013-09-09 (×4): qty 1

## 2013-09-09 MED ORDER — SIMVASTATIN 40 MG PO TABS
40.0000 mg | ORAL_TABLET | Freq: Every day | ORAL | Status: DC
  Administered 2013-09-09 – 2013-09-11 (×3): 40 mg via ORAL
  Filled 2013-09-09 (×4): qty 1

## 2013-09-09 MED ORDER — FENTANYL CITRATE 0.05 MG/ML IJ SOLN
INTRAMUSCULAR | Status: AC
  Administered 2013-09-09: 50 ug via INTRAVENOUS
  Filled 2013-09-09: qty 2

## 2013-09-09 MED ORDER — METOCLOPRAMIDE HCL 5 MG/ML IJ SOLN: 5.0000 mg | Freq: Three times a day (TID) | INTRAMUSCULAR | Status: DC | PRN

## 2013-09-09 MED ORDER — LIDOCAINE HCL (CARDIAC) 20 MG/ML IV SOLN
INTRAVENOUS | Status: AC
  Filled 2013-09-09: qty 5

## 2013-09-09 MED ORDER — GLYCOPYRROLATE 0.2 MG/ML IJ SOLN
INTRAMUSCULAR | Status: DC | PRN
  Administered 2013-09-09: 0.4 mg via INTRAVENOUS

## 2013-09-09 MED ORDER — OXYCODONE HCL 5 MG PO TABS
ORAL_TABLET | ORAL | Status: AC
  Filled 2013-09-09: qty 1

## 2013-09-09 MED ORDER — DEXAMETHASONE SODIUM PHOSPHATE 4 MG/ML IJ SOLN
INTRAMUSCULAR | Status: AC
  Filled 2013-09-09: qty 1

## 2013-09-09 MED ORDER — BUPIVACAINE LIPOSOME 1.3 % IJ SUSP
20.0000 mL | Freq: Once | INTRAMUSCULAR | Status: DC
  Filled 2013-09-09: qty 20

## 2013-09-09 MED ORDER — ACETAMINOPHEN 10 MG/ML IV SOLN
1000.0000 mg | Freq: Four times a day (QID) | INTRAVENOUS | Status: DC
  Administered 2013-09-09: 1000 mg via INTRAVENOUS

## 2013-09-09 MED ORDER — GLYCOPYRROLATE 0.2 MG/ML IJ SOLN
INTRAMUSCULAR | Status: AC
  Filled 2013-09-09: qty 2

## 2013-09-09 MED ORDER — ACETAMINOPHEN 10 MG/ML IV SOLN
INTRAVENOUS | Status: AC
  Filled 2013-09-09: qty 100

## 2013-09-09 MED ORDER — CEFUROXIME SODIUM 1.5 G IJ SOLR
INTRAMUSCULAR | Status: AC
  Filled 2013-09-09: qty 1.5

## 2013-09-09 MED ORDER — HYDROMORPHONE HCL PF 1 MG/ML IJ SOLN
INTRAMUSCULAR | Status: AC
  Filled 2013-09-09: qty 2

## 2013-09-09 MED ORDER — ACETAMINOPHEN 10 MG/ML IV SOLN
1000.0000 mg | Freq: Once | INTRAVENOUS | Status: AC
  Administered 2013-09-09: 1000 mg via INTRAVENOUS

## 2013-09-09 SURGICAL SUPPLY — 72 items
BANDAGE ESMARK 6X9 LF (GAUZE/BANDAGES/DRESSINGS) ×1 IMPLANT
BLADE SAG 18X100X1.27 (BLADE) ×3 IMPLANT
BLADE SAW SGTL 13X75X1.27 (BLADE) ×3 IMPLANT
BLADE SURG ROTATE 9660 (MISCELLANEOUS) IMPLANT
BNDG CMPR 9X6 STRL LF SNTH (GAUZE/BANDAGES/DRESSINGS) ×1
BNDG CMPR MED 10X6 ELC LF (GAUZE/BANDAGES/DRESSINGS) ×1
BNDG ELASTIC 6X10 VLCR STRL LF (GAUZE/BANDAGES/DRESSINGS) ×3 IMPLANT
BNDG ESMARK 6X9 LF (GAUZE/BANDAGES/DRESSINGS) ×3
BOWL SMART MIX CTS (DISPOSABLE) ×3 IMPLANT
CAPT RP KNEE ×2 IMPLANT
CEMENT HV SMART SET (Cement) ×6 IMPLANT
CLOTH BEACON ORANGE TIMEOUT ST (SAFETY) ×3 IMPLANT
COVER SURGICAL LIGHT HANDLE (MISCELLANEOUS) ×3 IMPLANT
CUFF TOURNIQUET SINGLE 34IN LL (TOURNIQUET CUFF) ×2 IMPLANT
CUFF TOURNIQUET SINGLE 44IN (TOURNIQUET CUFF) IMPLANT
DRAPE EXTREMITY T 121X128X90 (DRAPE) ×3 IMPLANT
DRAPE STERI 35X30 U-POUCH (DRAPES) ×2 IMPLANT
DRAPE U-SHAPE 47X51 STRL (DRAPES) ×3 IMPLANT
DURAPREP 26ML APPLICATOR (WOUND CARE) ×5 IMPLANT
ELECT REM PT RETURN 9FT ADLT (ELECTROSURGICAL) ×3
ELECTRODE REM PT RTRN 9FT ADLT (ELECTROSURGICAL) ×1 IMPLANT
EVACUATOR 1/8 PVC DRAIN (DRAIN) ×3 IMPLANT
GAUZE XEROFORM 1X8 LF (GAUZE/BANDAGES/DRESSINGS) ×3 IMPLANT
GLOVE BIO SURGEON STRL SZ7 (GLOVE) ×2 IMPLANT
GLOVE BIO SURGEON STRL SZ7.5 (GLOVE) ×3 IMPLANT
GLOVE BIO SURGEON STRL SZ8.5 (GLOVE) ×5 IMPLANT
GLOVE BIOGEL PI IND STRL 6.5 (GLOVE) IMPLANT
GLOVE BIOGEL PI IND STRL 7.0 (GLOVE) IMPLANT
GLOVE BIOGEL PI IND STRL 8 (GLOVE) ×1 IMPLANT
GLOVE BIOGEL PI IND STRL 9 (GLOVE) ×1 IMPLANT
GLOVE BIOGEL PI INDICATOR 6.5 (GLOVE) ×2
GLOVE BIOGEL PI INDICATOR 7.0 (GLOVE) ×2
GLOVE BIOGEL PI INDICATOR 8 (GLOVE) ×2
GLOVE BIOGEL PI INDICATOR 9 (GLOVE) ×2
GLOVE SURG SS PI 6.5 STRL IVOR (GLOVE) ×4 IMPLANT
GOWN PREVENTION PLUS XLARGE (GOWN DISPOSABLE) ×1 IMPLANT
GOWN STRL NON-REIN LRG LVL3 (GOWN DISPOSABLE) ×1 IMPLANT
GOWN STRL REIN XL XLG (GOWN DISPOSABLE) ×1 IMPLANT
GOWN STRL REUS W/ TWL LRG LVL3 (GOWN DISPOSABLE) IMPLANT
GOWN STRL REUS W/ TWL XL LVL3 (GOWN DISPOSABLE) IMPLANT
GOWN STRL REUS W/TWL LRG LVL3 (GOWN DISPOSABLE) ×3
GOWN STRL REUS W/TWL XL LVL3 (GOWN DISPOSABLE) ×9
HANDPIECE INTERPULSE COAX TIP (DISPOSABLE) ×3
HOOD PEEL AWAY FACE SHEILD DIS (HOOD) ×6 IMPLANT
IMMOBILIZER KNEE 20 (SOFTGOODS) ×3
IMMOBILIZER KNEE 20 THIGH 36 (SOFTGOODS) IMPLANT
KIT BASIN OR (CUSTOM PROCEDURE TRAY) ×3 IMPLANT
KIT ROOM TURNOVER OR (KITS) ×3 IMPLANT
MANIFOLD NEPTUNE II (INSTRUMENTS) ×3 IMPLANT
NDL SAFETY ECLIPSE 18X1.5 (NEEDLE) IMPLANT
NDL SPNL 18GX3.5 QUINCKE PK (NEEDLE) IMPLANT
NEEDLE HYPO 18GX1.5 SHARP (NEEDLE)
NEEDLE SPNL 18GX3.5 QUINCKE PK (NEEDLE) ×3 IMPLANT
NS IRRIG 1000ML POUR BTL (IV SOLUTION) ×3 IMPLANT
PACK TOTAL JOINT (CUSTOM PROCEDURE TRAY) ×3 IMPLANT
PAD ABD 8X10 STRL (GAUZE/BANDAGES/DRESSINGS) ×2 IMPLANT
PAD ARMBOARD 7.5X6 YLW CONV (MISCELLANEOUS) ×4 IMPLANT
PADDING CAST COTTON 6X4 STRL (CAST SUPPLIES) ×3 IMPLANT
SET HNDPC FAN SPRY TIP SCT (DISPOSABLE) ×1 IMPLANT
SPONGE GAUZE 4X4 12PLY (GAUZE/BANDAGES/DRESSINGS) ×4 IMPLANT
STAPLER VISISTAT 35W (STAPLE) ×3 IMPLANT
SUCTION FRAZIER TIP 10 FR DISP (SUCTIONS) ×3 IMPLANT
SUT VIC AB 0 CTX 36 (SUTURE) ×3
SUT VIC AB 0 CTX36XBRD ANTBCTR (SUTURE) ×1 IMPLANT
SUT VIC AB 1 CTX 36 (SUTURE) ×3
SUT VIC AB 1 CTX36XBRD ANBCTR (SUTURE) ×1 IMPLANT
SUT VIC AB 2-0 CT1 27 (SUTURE) ×3
SUT VIC AB 2-0 CT1 TAPERPNT 27 (SUTURE) ×1 IMPLANT
SYR 50ML LL SCALE MARK (SYRINGE) ×3 IMPLANT
TOWEL OR 17X24 6PK STRL BLUE (TOWEL DISPOSABLE) ×3 IMPLANT
TOWEL OR 17X26 10 PK STRL BLUE (TOWEL DISPOSABLE) ×3 IMPLANT
WATER STERILE IRR 1000ML POUR (IV SOLUTION) ×2 IMPLANT

## 2013-09-09 NOTE — Anesthesia Postprocedure Evaluation (Signed)
  Anesthesia Post-op Note  Patient: Carrie White  Procedure(s) Performed: Procedure(s): TOTAL KNEE ARTHROPLASTY- left (Left)  Patient Location: PACU  Anesthesia Type:General  Level of Consciousness: awake, alert  and oriented  Airway and Oxygen Therapy: Patient Spontanous Breathing and Patient connected to nasal cannula oxygen  Post-op Pain: mild  Post-op Assessment: Post-op Vital signs reviewed, Patient's Cardiovascular Status Stable, Respiratory Function Stable, Patent Airway and Pain level controlled  Post-op Vital Signs: stable  Complications: No apparent anesthesia complications

## 2013-09-09 NOTE — Progress Notes (Signed)
Precedex rate dose change to 0.5kg/ml/hour.

## 2013-09-09 NOTE — Preoperative (Signed)
Beta Blockers   Reason not to administer Beta Blockers:Not Applicable 

## 2013-09-09 NOTE — Anesthesia Procedure Notes (Addendum)
Procedure Name: Intubation Date/Time: 09/09/2013 10:28 AM Performed by: Julian Reil Pre-anesthesia Checklist: Patient identified, Emergency Drugs available, Suction available and Patient being monitored Patient Re-evaluated:Patient Re-evaluated prior to inductionOxygen Delivery Method: Circle system utilized Preoxygenation: Pre-oxygenation with 100% oxygen Intubation Type: IV induction Ventilation: Mask ventilation without difficulty Laryngoscope Size: Mac and 3 Grade View: Grade I Tube type: Oral Tube size: 7.5 mm Number of attempts: 1 Airway Equipment and Method: Stylet Placement Confirmation: ETT inserted through vocal cords under direct vision,  positive ETCO2 and breath sounds checked- equal and bilateral Secured at: 21 cm Tube secured with: Tape Dental Injury: Teeth and Oropharynx as per pre-operative assessment    Anesthesia Regional Block:  Femoral nerve block  Pre-Anesthetic Checklist: ,, timeout performed, Correct Patient, Correct Site, Correct Laterality, Correct Procedure, Correct Position, site marked, Risks and benefits discussed,  Surgical consent,  Pre-op evaluation,  At surgeon's request and post-op pain management  Laterality: Left  Prep: chloraprep       Needles:   Needle Type: Other     Needle Length: 9cm  Needle Gauge: 21 and 21 G    Additional Needles:  Procedures: ultrasound guided (picture in chart) Femoral nerve block Narrative:  Start time: 09/09/2013 10:05 AM End time: 09/09/2013 10:12 AM Injection made incrementally with aspirations every 5 mL.  Performed by: Personally  Anesthesiologist: C. Mayia Megill MD  Additional Notes: Ultrasound guidance used to: id relevant anatomy, confirm needle position, local anesthetic spread, avoidance of vascular puncture. Picture saved. No complications. Block performed personally by Jessy Oto. Albertina Parr, MD

## 2013-09-09 NOTE — Progress Notes (Signed)
precedex stopped for 15 minutes to evaluate patients pain level.

## 2013-09-09 NOTE — Progress Notes (Signed)
Orthopedic Tech Progress Note Patient Details:  Carrie White 02-25-1941 [id number] CPM applied to Left LE with appropriate settings. OHF applied to bed.  CPM Left Knee CPM Left Knee: On Left Knee Flexion (Degrees): 60 Left Knee Extension (Degrees): 0   Asia R Thompson 09/09/2013, 1:22 PM

## 2013-09-09 NOTE — Interval H&P Note (Signed)
History and Physical Interval Note:  09/09/2013 9:03 AM  Carrie White  has presented today for surgery, with the diagnosis of RIGHT KNEE OSTEOARTHRITIS  The various methods of treatment have been discussed with the patient and family. After consideration of risks, benefits and other options for treatment, the patient has consented to  Procedure(s): TOTAL KNEE ARTHROPLASTY (Left) as a surgical intervention .  The patient's history has been reviewed, patient examined, no change in status, stable for surgery.  I have reviewed the patient's chart and labs.  Questions were answered to the patient's satisfaction.     Kerin Salen

## 2013-09-09 NOTE — Op Note (Signed)
PATIENT ID:      BERGEN MELLE  MRN:     [id number] DOB/AGE:    [birth year] / 73 y.o.       OPERATIVE REPORT    DATE OF PROCEDURE:  09/09/2013       PREOPERATIVE DIAGNOSIS:   RIGHT KNEE OSTEOARTHRITIS      Estimated body mass index is 36.98 kg/(m^2) as calculated from the following:   Height as of 09/02/13: 5' 2.5" (1.588 m).   Weight as of 06/26/13: 93.26 kg (205 lb 9.6 oz).                                                        POSTOPERATIVE DIAGNOSIS:   RIGHT KNEE OSTEOARTHRITIS                                                                      PROCEDURE:  Procedure(s): TOTAL KNEE ARTHROPLASTY- left Using Depuy Sigma RP implants #3L Femur, #3Tibia, 19mm sigma RP bearing, 35 Patella     SURGEON: Lataya Varnell J    ASSISTANT:   [employer]   (Present and scrubbed throughout the case, critical for assistance with exposure, retraction, instrumentation, and closure.)         ANESTHESIA: GET Exparel  DRAINS: foley, 2 medium hemovac in knee   TOURNIQUET TIME: 71IRC   COMPLICATIONS:  None     SPECIMENS: None   INDICATIONS FOR PROCEDURE: The patient has  RIGHT KNEE OSTEOARTHRITIS, varus deformities, XR shows bone on bone arthritis. Patient has failed all conservative measures including anti-inflammatory medicines, narcotics, attempts at  exercise and weight loss, cortisone injections and viscosupplementation.  Risks and benefits of surgery have been discussed, questions answered.   DESCRIPTION OF PROCEDURE: The patient identified by armband, received  IV antibiotics, in the holding area at [facility]. Patient taken to the operating room, appropriate anesthetic  monitors were attached, and general endotracheal anesthesia induced with  the patient in supine position, Foley catheter was inserted. Tourniquet  applied high to the operative thigh. Lateral post and foot positioner  applied to the table, the lower extremity was then prepped and draped  in usual sterile fashion  from the ankle to the tourniquet. Time-out procedure was performed. The limb was wrapped with an Esmarch bandage and the tourniquet inflated to 350 mmHg. We began the operation by making the anterior midline incision starting at handbreadth above the patella going over the patella 1 cm medial to and  4 cm distal to the tibial tubercle. Small bleeders in the skin and the  subcutaneous tissue identified and cauterized. Transverse retinaculum was incised and reflected medially and a medial parapatellar arthrotomy was accomplished. the patella was everted and theprepatellar fat pad resected. The superficial medial collateral  ligament was then elevated from anterior to posterior along the proximal  flare of the tibia and anterior half of the menisci resected. The knee was hyperflexed exposing bone on bone arthritis. Peripheral and notch osteophytes as well as the cruciate ligaments were then resected. We continued to  work our way around  posteriorly along the proximal tibia, and externally  rotated the tibia subluxing it out from underneath the femur. A McHale  retractor was placed through the notch and a lateral Hohmann retractor  placed, and we then drilled through the proximal tibia in line with the  axis of the tibia followed by an intramedullary guide rod and 2-degree  posterior slope cutting guide. The tibial cutting guide was pinned into place  allowing resection of 4 mm of bone medially and about 10 mm of bone  laterally because of her varus deformity. Satisfied with the tibial resection, we then  entered the distal femur 2 mm anterior to the PCL origin with the  intramedullary guide rod and applied the distal femoral cutting guide  set at 69mm, with 5 degrees of valgus. This was pinned along the  epicondylar axis. At this point, the distal femoral cut was accomplished without difficulty. We then sized for a #3 femoral component and pinned the guide in 3 degrees of external rotation.The chamfer  cutting guide was pinned into place. The anterior, posterior, and chamfer cuts were accomplished without difficulty followed by  the Sigma RP box cutting guide and the box cut. We also removed posterior osteophytes from the posterior femoral condyles. At this  time, the knee was brought into full extension. We checked our  extension and flexion gaps and found them symmetric at 14mm. Exparel was injected posteriorly at this time, 20cc diluted to 60cc The patella thickness measured at 24 mm. We set the cutting guide at 15 and removed the posterior 9 mm  of the patella, sized for a 35 button and drilled the lollipop. The knee  was then once again hyperflexed exposing the proximal tibia. We sized for a #3 tibial base plate, applied the smokestack and the conical reamer followed by the the Delta fin keel punch. We then hammered into place the Sigma RP trial femoral component, inserted a 10-mm trial bearing, trial patellar button, and took the knee through range of motion from 0-130 degrees. No thumb pressure was required for patellar  tracking. At this point, all trial components were removed, a double batch of DePuy HV cement with 1500 mg of Zinacef was mixed and applied to all bony metallic mating surfaces except for the posterior condyles of the femur itself. In order, we  hammered into place the tibial tray and removed excess cement, the femoral component and removed excess cement, a 10-mm Sigma RP bearing  was inserted, and the knee brought to full extension with compression.  The patellar button was clamped into place, and excess cement  removed. While the cement cured the wound was irrigated out with normal saline solution pulse lavage, and medium Hemovac drains were placed from an anterolateral  approach. Ligament stability and patellar tracking were checked and found to be excellent. The parapatellar arthrotomy was closed with  running #1 Vicryl suture. The subcutaneous tissue with 0 and 2-0 undyed   Vicryl suture, and the skin with skin staples. A dressing of Xeroform,  4 x 4, dressing sponges, Webril, and Ace wrap applied. The patient  awakened, extubated, and taken to recovery room without difficulty.   Thaddus Mcdowell J 09/09/2013, 11:38 AM

## 2013-09-09 NOTE — Anesthesia Preprocedure Evaluation (Signed)
Anesthesia Evaluation  Patient identified by MRN, date of birth, ID band Patient awake    Reviewed: Allergy & Precautions, H&P , NPO status , Patient's Chart, lab work & pertinent test results, reviewed documented beta blocker date and time   History of Anesthesia Complications (+) PONV and history of anesthetic complications  Airway Mallampati: II TM Distance: >3 FB Neck ROM: full    Dental   Pulmonary neg pulmonary ROS, former smoker,  breath sounds clear to auscultation        Cardiovascular hypertension, negative cardio ROS  Rhythm:regular     Neuro/Psych PSYCHIATRIC DISORDERS  Neuromuscular disease negative psych ROS   GI/Hepatic Neg liver ROS, GERD-  Medicated and Controlled,  Endo/Other  diabetes, Insulin Dependent and Oral Hypoglycemic AgentsMorbid obesity  Renal/GU negative Renal ROS  negative genitourinary   Musculoskeletal   Abdominal   Peds  Hematology negative hematology ROS (+)   Anesthesia Other Findings See surgeon's H&P   Reproductive/Obstetrics negative OB ROS                           Anesthesia Physical Anesthesia Plan  ASA: III  Anesthesia Plan: General   Post-op Pain Management:    Induction: Intravenous  Airway Management Planned: Oral ETT  Additional Equipment:   Intra-op Plan:   Post-operative Plan:   Informed Consent: I have reviewed the patients History and Physical, chart, labs and discussed the procedure including the risks, benefits and alternatives for the proposed anesthesia with the patient or authorized representative who has indicated his/her understanding and acceptance.   Dental Advisory Given  Plan Discussed with: CRNA and Surgeon  Anesthesia Plan Comments:         Anesthesia Quick Evaluation

## 2013-09-09 NOTE — Transfer of Care (Signed)
Immediate Anesthesia Transfer of Care Note  Patient: Carrie White  Procedure(s) Performed: Procedure(s): TOTAL KNEE ARTHROPLASTY- left (Left)  Patient Location: PACU  Anesthesia Type:GA combined with regional for post-op pain  Level of Consciousness: awake, alert , oriented and patient cooperative  Airway & Oxygen Therapy: Patient Spontanous Breathing and Patient connected to nasal cannula oxygen  Post-op Assessment: Report given to PACU RN, Post -op Vital signs reviewed and stable and Patient moving all extremities  Post vital signs: Reviewed and stable  Complications: No apparent anesthesia complications

## 2013-09-10 ENCOUNTER — Encounter (HOSPITAL_COMMUNITY): Payer: Self-pay | Admitting: General Practice

## 2013-09-10 LAB — CBC
HCT: 31.6 % — ABNORMAL LOW (ref 36.0–46.0)
Hemoglobin: 10.8 g/dL — ABNORMAL LOW (ref 12.0–15.0)
MCH: 30.6 pg (ref 26.0–34.0)
MCHC: 34.2 g/dL (ref 30.0–36.0)
MCV: 89.5 fL (ref 78.0–100.0)
PLATELETS: 207 10*3/uL (ref 150–400)
RBC: 3.53 MIL/uL — ABNORMAL LOW (ref 3.87–5.11)
RDW: 13.3 % (ref 11.5–15.5)
WBC: 10.4 10*3/uL (ref 4.0–10.5)

## 2013-09-10 LAB — GLUCOSE, CAPILLARY
GLUCOSE-CAPILLARY: 254 mg/dL — AB (ref 70–99)
GLUCOSE-CAPILLARY: 300 mg/dL — AB (ref 70–99)
GLUCOSE-CAPILLARY: 192 mg/dL — AB (ref 70–99)
Glucose-Capillary: 199 mg/dL — ABNORMAL HIGH (ref 70–99)

## 2013-09-10 LAB — BASIC METABOLIC PANEL
BUN: 6 mg/dL (ref 6–23)
CALCIUM: 8.4 mg/dL (ref 8.4–10.5)
CO2: 28 mEq/L (ref 19–32)
CREATININE: 0.58 mg/dL (ref 0.50–1.10)
Chloride: 94 mEq/L — ABNORMAL LOW (ref 96–112)
GFR calc non Af Amer: >90 mL/min (ref >90)
Glucose, Bld: 260 mg/dL — ABNORMAL HIGH (ref 70–99)
Potassium: 3.9 mEq/L (ref 3.7–5.3)
Sodium: 134 mEq/L — ABNORMAL LOW (ref 137–147)

## 2013-09-10 LAB — HEMOGLOBIN A1C
Hgb A1c MFr Bld: 7.3 % — ABNORMAL HIGH (ref <5.7)
MEAN PLASMA GLUCOSE: 163 mg/dL — AB (ref <117)

## 2013-09-10 NOTE — Progress Notes (Addendum)
Patient ID: Carrie White, female   DOB: [birth year], 73 y.o.   MRN: [id number] PATIENT ID: Carrie White  MRN: [id number]  DOB/AGE:  1940-12-09 / 73 y.o.  1 Day Post-Op Procedure(s) (LRB): TOTAL KNEE ARTHROPLASTY- left (Left)    PROGRESS NOTE Subjective: Patient is alert, oriented, no Nausea, no Vomiting, yes passing gas, no Bowel Movement. Taking PO sips. Denies SOB, Chest or Calf Pain. Using Incentive Spirometer, PAS in place. Ambulate WBAT today, CPM 0-60 Patient reports pain as 5 on 0-10 scale  .    Objective: Vital signs in last 24 hours: Filed Vitals:   09/10/13 0000 09/10/13 0207 09/10/13 0400 09/10/13 0454  BP:  156/75  148/69  Pulse:  88  79  Temp:  98.4 F (36.9 C)  98.6 F (37 C)  TempSrc:  Oral  Oral  Resp: 19 18 18 18   Height:      Weight:      SpO2: 100% 99% 99% 99%      Intake/Output from previous day: I/O last 3 completed shifts: In: 3783.3 [P.O.:120; I.V.:3663.3] Out: 2200 [Urine:1900; Drains:250; Blood:50]   Intake/Output this shift:     LABORATORY DATA:  Recent Labs  09/09/13 1731 09/09/13 2126 09/10/13 0545 09/10/13 0634  WBC  --   --  10.4  --   HGB  --   --  10.8*  --   HCT  --   --  31.6*  --   PLT  --   --  207  --   NA  --   --  134*  --   K  --   --  3.9  --   CL  --   --  94*  --   CO2  --   --  28  --   BUN  --   --  6  --   CREATININE  --   --  0.58  --   GLUCOSE  --   --  260*  --   GLUCAP 245* 272*  --  254*  CALCIUM  --   --  8.4  --     Examination: Neurologically intact ABD soft Neurovascular intact Sensation intact distally Intact pulses distally Dorsiflexion/Plantar flexion intact Incision: scant drainage No cellulitis present Compartment soft} Blood and plasma separated in drain indicating minimal recent drainage, drain pulled without difficulty.  Assessment:   1 Day Post-Op Procedure(s) (LRB): TOTAL KNEE ARTHROPLASTY- left (Left) ADDITIONAL DIAGNOSIS:  Diabetes and Hypertension  Plan: PT/OT WBAT, CPM 5/hrs  day until ROM 0-90 degrees, then D/C CPM DVT Prophylaxis:  SCDx72hrs, ASA 325 mg BID x 2 weeks DISCHARGE PLAN: Skilled Nursing Facility/Rehab, [facility] DISCHARGE NEEDS: HHPT, HHRN, CPM, Walker and 3-in-1 comode seat     Jerelyn Trimarco J 09/10/2013, 7:36 AM

## 2013-09-10 NOTE — Evaluation (Signed)
Physical Therapy Evaluation Patient Details Name: SANDIE SWAYZE MRN: [id number] DOB: 12/08/1940 Today's Date: 09/10/2013 Time: 2952-8413 PT Time Calculation (min): 31 min  PT Assessment / Plan / Recommendation History of Present Illness  Pt is a 73 y/o female admitted s/p L TKA.   Clinical Impression  This patient presents with acute pain and decreased functional independence following the above mentioned procedure. At the time of PT eval, pt was able to perform transfers with min-mod assist, and ambulation with occasional assist for walker placement. This patient is appropriate for skilled PT interventions to address functional limitations, improve safety and independence with functional mobility, and return to PLOF.     PT Assessment  Patient needs continued PT services    Follow Up Recommendations  SNF    Does the patient have the potential to tolerate intense rehabilitation      Barriers to Discharge Decreased caregiver support Lives alone    Equipment Recommendations  Other (comment) (TBD by next venue of care)    Recommendations for Other Services     Frequency 7X/week    Precautions / Restrictions Precautions Precautions: Fall;Knee Precaution Comments: Discussed towel roll under heel and NO pillow under knee. Restrictions Weight Bearing Restrictions: Yes LLE Weight Bearing: Weight bearing as tolerated   Pertinent Vitals/Pain Pt reports 10/10 pain whenever asked during session. She states that she had pain medication prior to therapy. Overall, pt doesn't appear to be significantly limited by pain in functional mobility.      Mobility  Bed Mobility Overal bed mobility: Needs Assistance Bed Mobility: Supine to Sit Supine to sit: Mod assist General bed mobility comments: VC's for sequencing and technique. Hand-over-hand to reach for bed rails, and assist for movement and support of LLE.  Transfers Overall transfer level: Needs assistance Equipment used: Rolling  walker (2 wheeled) Transfers: Sit to/from Stand Sit to Stand: Min assist General transfer comment: VC's for hand placement on seated surface for safety. Assist to power up to full stand.  Ambulation/Gait Ambulation/Gait assistance: Min assist Ambulation Distance (Feet): 40 Feet Assistive device: Rolling walker (2 wheeled) Gait Pattern/deviations: Step-to pattern;Decreased stride length;Trunk flexed Gait velocity: Decreased Gait velocity interpretation: Below normal speed for age/gender General Gait Details: VC's for sequencing and safety awareness with the RW. Pt required occasional assist for walker placement, especially during turns.     Exercises Total Joint Exercises Ankle Circles/Pumps: 10 reps Quad Sets: 10 reps Heel Slides: 10 reps Goniometric ROM: 70 AAROM   PT Diagnosis: Difficulty walking;Acute pain  PT Problem List: Decreased strength;Decreased range of motion;Decreased activity tolerance;Decreased balance;Decreased mobility;Decreased knowledge of use of DME;Decreased safety awareness;Pain PT Treatment Interventions: DME instruction;Gait training;Stair training;Functional mobility training;Therapeutic activities;Therapeutic exercise;Neuromuscular re-education;Patient/family education     PT Goals(Current goals can be found in the care plan section) Acute Rehab PT Goals Patient Stated Goal: To d/c to SNF for continued rehab PT Goal Formulation: With patient/family Time For Goal Achievement: 09/24/13 Potential to Achieve Goals: Good  Visit Information  Last PT Received On: 09/10/13 Assistance Needed: +1 History of Present Illness: Pt is a 73 y/o female admitted s/p L TKA.        Prior [place] expects to be discharged to:: Other (Comment) (REHAB) Living Arrangements: Alone Additional Comments: [place] Prior Function Level of Independence: Independent with assistive device(s) Communication Communication: No  difficulties Dominant Hand: Right    Cognition  Cognition Arousal/Alertness: Awake/alert Behavior During Therapy: WFL for tasks assessed/performed Overall Cognitive Status: Within Functional Limits for  tasks assessed    Extremity/Trunk Assessment Upper Extremity Assessment Upper Extremity Assessment: Defer to OT evaluation Lower Extremity Assessment Lower Extremity Assessment: LLE deficits/detail LLE Deficits / Details: Decreased strength and AROM consistent with TKA LLE: Unable to fully assess due to pain Cervical / Trunk Assessment Cervical / Trunk Assessment: Normal   Balance Balance Overall balance assessment: Needs assistance Sitting-balance support: Feet supported;Bilateral upper extremity supported Sitting balance-Leahy Scale: Fair Standing balance support: Bilateral upper extremity supported Standing balance-Leahy Scale: Fair  End of Session PT - End of Session Equipment Utilized During Treatment: Gait belt Activity Tolerance: Patient limited by pain Patient left: in chair;with call bell/phone within reach;with family/visitor present Nurse Communication: Mobility status CPM Left Knee CPM Left Knee: Off  GP     Jolyn Lent 09/10/2013, 11:20 AM  Jolyn Lent, PT, DPT [phone]

## 2013-09-10 NOTE — Clinical Social Work Placement (Signed)
Clinical Social Work Department CLINICAL SOCIAL WORK PLACEMENT NOTE 09/10/2013  Patient:  Carrie White, Carrie White  Account Number:  [id number] Admit date:  09/09/2013  Clinical Social Worker:  Barbette Or, LCSW  Date/time:  09/10/2013 02:00 PM  Clinical Social Work is seeking post-discharge placement for this patient at the following level of care:   [place]   (*CSW will update this form in Epic as items are completed)   09/10/2013  Patient/family provided with [place] Department of Clinical Social Work's list of facilities offering this level of care within the geographic area requested by the patient (or if unable, by the patient's family).  09/10/2013  Patient/family informed of their freedom to choose among providers that offer the needed level of care, that participate in Medicare, Medicaid or managed care program needed by the patient, have an available bed and are willing to accept the patient.  09/10/2013  Patient/family informed of [facility]' ownership interest in [facility], as well as of the fact that they are under no obligation to receive care at this facility.  PASARR submitted to EDS on 09/10/2013 PASARR number received from EDS on 09/10/2013  FL2 transmitted to all facilities in geographic area requested by pt/family on  09/10/2013 FL2 transmitted to all facilities within larger geographic area on   Patient informed that his/her managed care company has contracts with or will negotiate with  certain facilities, including the following:     Patient/family informed of bed offers received:   Patient chooses bed at  Physician recommends and patient chooses bed at    Patient to be transferred to  on   Patient to be transferred to facility by   The following physician request were entered in Epic:   Additional Comments: 01/27 patient is pre-registered at [facility]

## 2013-09-10 NOTE — Progress Notes (Signed)
Orthopedic Tech Progress Note Patient Details:  Carrie White 07/08/1941 [id number]  Patient ID: Carrie White, female   DOB: Nov 28, 1940, 73 y.o.   MRN: [id number] Placed pt's lle in cpm @ 0-40 degrees @1450 ; will increase as pt tolerates; rn notified  Hildred Priest 09/10/2013, 2:44 PM

## 2013-09-10 NOTE — Progress Notes (Signed)
Utilization review completed.  

## 2013-09-10 NOTE — Plan of Care (Signed)
Problem: Consults Goal: Diagnosis- Total Joint Replacement Outcome: Completed/Met Date Met:  09/10/13 Primary Total Knee, Left

## 2013-09-10 NOTE — Clinical Social Work Note (Signed)
Clinical Social Work Department BRIEF PSYCHOSOCIAL ASSESSMENT 09/10/2013  Patient:  Carrie White, Carrie White     Account Number:  [id number]     Admit date:  09/09/2013  Clinical Social Worker:  Myles Lipps  Date/Time:  09/10/2013 02:00 PM  Referred by:  Physician  Date Referred:  09/10/2013 Referred for  SNF Placement   Other Referral:   Interview type:  Patient Other interview type:   No family currently at bedside    PSYCHOSOCIAL DATA Living Status:  ALONE Admitted from facility:   Level of care:   Primary support name:  Jniyah, Dantuono  [phone] Primary support relationship to patient:  CHILD, ADULT Degree of support available:   Strong support from patient step daughter    CURRENT CONCERNS Current Concerns  Post-Acute Placement   Other Concerns:    SOCIAL WORK ASSESSMENT / PLAN Clinical Social Worker met with patient at bedside to offer support and discuss patient needs at discharge.  Patient states that she lives home alone and has made arrangements and pre-registered with [facility].  Patient provided permission to contact facility regarding plans.  Patient verbalizes her wishes to return home but understands that she does not have adequate help and must participate with ST-SNF prior to return home.  CSW spoke with [facility] who confirms that patient paperwork is complete and able to admit once medically ready.  CSW remains available for support and to facilitate patient discharge needs once medically stable.   Assessment/plan status:  Psychosocial Support/Ongoing Assessment of Needs Other assessment/ plan:   Information/referral to community resources:   Hol[facility] representative offered patient additional resources, however patient states that she is satisfied with her current arrangements and is anxious to get it over with    PATIENT'S/FAMILY'S RESPONSE TO PLAN OF CARE: Patient alert and oriented x3 laying in bed in pain. Patient with limited family support at  home, therefore SNF placement is needed at discharge.  Patient with [facility] and will need prior authorization prior to patient discharge.  Patient verbalized her appreciation for CSW support and concern.

## 2013-09-10 NOTE — Progress Notes (Signed)
Orthopedic Tech Progress Note Patient Details:  Carrie White [birth year] [id number] On cpm at 8:00 pm LLE 0-35 Patient ID: KENDAHL BUMGARDNER, female   DOB: 09-01-1940, 73 y.o.   MRN: [id number]   [name] 09/10/2013, 8:03 PM

## 2013-09-10 NOTE — Care Management Note (Signed)
CARE MANAGEMENT NOTE 09/10/2013  Patient:  Carrie White, Carrie White   Account Number:  [id number]  Date Initiated:  09/10/2013  Documentation initiated by:  Ricki Miller  Subjective/Objective Assessment:   73 yr old female s/p right total knee arthroplasty.     Action/Plan:   Patient states she is going to [facility] for shortterm rehab. Social Worker notified.   Anticipated DC Date:  09/12/2013   Anticipated DC Plan:  SKILLED NURSING FACILITY  In-house referral  Clinical Social Worker      DC Planning Services  CM consult      Choice offered to / List presented to:             Status of service:  Completed, signed off Medicare Important Message given?   (If response is "NO", the following Medicare IM given date fields will be blank) Date Medicare IM given:   Date Additional Medicare IM given:    Discharge Disposition:  [place]

## 2013-09-10 NOTE — Progress Notes (Signed)
Inpatient Diabetes Program Recommendations  AACE/ADA: New Consensus Statement on Inpatient Glycemic Control (2013)  Target Ranges:  Prepandial:   less than 140 mg/dL      Peak postprandial:   less than 180 mg/dL (1-2 hours)      Critically ill patients:  140 - 180 mg/dL   Reason for Visit: Results for Carrie White, Carrie White (MRN [id number]) as of 09/10/2013 10:46  Ref. Range 09/09/2013 09:17 09/09/2013 12:25 09/09/2013 17:31 09/09/2013 21:26 09/10/2013 06:34  Glucose-Capillary Latest Range: 70-99 mg/dL 122 (H) 154 (H) 245 (H) 272 (H) 254 (H)    Diabetes history: Yes, A1C pending. Outpatient Diabetes medications: NPH 35 units q HS, Humalog 8-15 units tid (dose varies) Current orders for Inpatient glycemic control: Moderate Novolog meals tid with meals, Novolog 4 units tid with meals  Please restart NPH 25 units q HS while in the hospital.  Thanks, Adah Perl, RN, BC-ADM Inpatient Diabetes Coordinator Pager [phone]

## 2013-09-11 LAB — GLUCOSE, CAPILLARY
GLUCOSE-CAPILLARY: 221 mg/dL — AB (ref 70–99)
Glucose-Capillary: 243 mg/dL — ABNORMAL HIGH (ref 70–99)
Glucose-Capillary: 216 mg/dL — ABNORMAL HIGH (ref 70–99)
Glucose-Capillary: 186 mg/dL — ABNORMAL HIGH (ref 70–99)

## 2013-09-11 LAB — CBC
HCT: 31.1 % — ABNORMAL LOW (ref 36.0–46.0)
Hemoglobin: 10.7 g/dL — ABNORMAL LOW (ref 12.0–15.0)
MCH: 30.7 pg (ref 26.0–34.0)
MCHC: 34.4 g/dL (ref 30.0–36.0)
MCV: 89.4 fL (ref 78.0–100.0)
PLATELETS: 195 10*3/uL (ref 150–400)
RBC: 3.48 MIL/uL — ABNORMAL LOW (ref 3.87–5.11)
RDW: 13.3 % (ref 11.5–15.5)
WBC: 9.7 10*3/uL (ref 4.0–10.5)

## 2013-09-11 MED ORDER — INSULIN NPH (HUMAN) (ISOPHANE) 100 UNIT/ML ~~LOC~~ SUSP
25.0000 [IU] | Freq: Every day | SUBCUTANEOUS | Status: DC
  Administered 2013-09-11: 25 [IU] via SUBCUTANEOUS
  Filled 2013-09-11: qty 10

## 2013-09-11 MED ORDER — OXYCODONE-ACETAMINOPHEN 5-325 MG PO TABS: 1.0000 | ORAL_TABLET | ORAL | Status: DC | PRN

## 2013-09-11 MED ORDER — METHOCARBAMOL 500 MG PO TABS: 500.0000 mg | ORAL_TABLET | Freq: Two times a day (BID) | ORAL | Status: DC

## 2013-09-11 MED ORDER — ASPIRIN EC 325 MG PO TBEC: 325.0000 mg | DELAYED_RELEASE_TABLET | Freq: Two times a day (BID) | ORAL | Status: DC

## 2013-09-11 MED ORDER — WHITE PETROLATUM GEL
Status: AC
Start: 2013-09-11 — End: 2013-09-11
  Administered 2013-09-11: 1
  Filled 2013-09-11: qty 5

## 2013-09-11 NOTE — Progress Notes (Signed)
Physical Therapy Treatment Patient Details Name: Carrie White MRN: [id number] DOB: 1940-12-20 Today's Date: 09/11/2013 Time: 3295-1884 PT Time Calculation (min): 45 min  PT Assessment / Plan / Recommendation  History of Present Illness Pt is a 73 y/o female admitted s/p L TKA.    PT Comments   Pt progressing towards physical therapy goals. Continues to be limited in AROM during therapeutic exercise, and requires assistance to achieve initiation of movement. Able to make corrective changes during gait training to improve gait pattern and fluidity of movement. Continue to recommend SNF at d/c for progressive rehab.  Follow Up Recommendations  SNF     Does the patient have the potential to tolerate intense rehabilitation     Barriers to Discharge        Equipment Recommendations  Other (comment) (TBD by next venue of care)    Recommendations for Other Services    Frequency 7X/week   Progress towards PT Goals Progress towards PT goals: Progressing toward goals  Plan Current plan remains appropriate    Precautions / Restrictions Precautions Precautions: Fall;Knee Precaution Comments: Discussed towel roll under heel and NO pillow under knee. Restrictions Weight Bearing Restrictions: Yes LLE Weight Bearing: Weight bearing as tolerated   Pertinent Vitals/Pain 10/10 during exercise, 8/10 at rest after session. Pt received pain medication prior to session beginning.     Mobility  Bed Mobility General bed mobility comments: Pt received sitting in recliner Transfers Overall transfer level: Needs assistance Equipment used: Rolling walker (2 wheeled) Transfers: Sit to/from Stand Sit to Stand: Min assist General transfer comment: VC's for safety, sequencing and hand placement on seated surface for safety. Assist & verbal cuing to power up to full stand.  Ambulation/Gait Ambulation/Gait assistance: Min guard Ambulation Distance (Feet): 60 Feet Assistive device: Rolling walker (2  wheeled) Gait Pattern/deviations: Step-to pattern;Step-through pattern;Decreased stride length Gait velocity: Decreased Gait velocity interpretation: Below normal speed for age/gender General Gait Details: VC's for sequencing and safety awareness with the RW. Specific cueing for increased heel strike and fluidity of walker movement.     Exercises Total Joint Exercises Ankle Circles/Pumps: 10 reps Quad Sets: 10 reps Heel Slides: 10 reps Hip ABduction/ADduction: 10 reps Goniometric ROM: 60 AROM   PT Diagnosis:    PT Problem List:   PT Treatment Interventions:     PT Goals (current goals can now be found in the care plan section) Acute Rehab PT Goals Patient Stated Goal: To d/c to SNF for continued rehab PT Goal Formulation: With patient/family Time For Goal Achievement: 09/24/13 Potential to Achieve Goals: Good  Visit Information  Last PT Received On: 09/11/13 Assistance Needed: +1 History of Present Illness: Pt is a 73 y/o female admitted s/p L TKA.     Subjective Data  Subjective: "It hurts." Patient Stated Goal: To d/c to SNF for continued rehab   Cognition  Cognition Arousal/Alertness: Awake/alert Behavior During Therapy: WFL for tasks assessed/performed Overall Cognitive Status: Within Functional Limits for tasks assessed    Balance  Balance Overall balance assessment: Needs assistance Sitting-balance support: Feet supported;Bilateral upper extremity supported Sitting balance-Leahy Scale: Fair Standing balance support: Bilateral upper extremity supported Standing balance-Leahy Scale: Fair  End of Session PT - End of Session Equipment Utilized During Treatment: Gait belt Activity Tolerance: Patient limited by pain Patient left: in chair;with call bell/phone within reach;with family/visitor present Nurse Communication: Mobility status   GP     Jolyn Lent 09/11/2013, 3:56 PM  Jolyn Lent, [place], DPT [phone]

## 2013-09-11 NOTE — Progress Notes (Addendum)
PATIENT ID: Carrie White  MRN: [id number]  DOB/AGE:  [birth year] / 73 y.o.  2 Days Post-Op Procedure(s) (LRB): TOTAL KNEE ARTHROPLASTY- left (Left)    PROGRESS NOTE Subjective: Patient is alert, oriented, no Nausea, no Vomiting, yes passing gas, no Bowel Movement. Taking PO well with mild nausea. Denies SOB, Chest or Calf Pain. Using Incentive Spirometer, PAS in place. Ambulate WBAT, CPM 0-35 Patient reports pain as moderate  .    Objective: Vital signs in last 24 hours: Filed Vitals:   09/10/13 2049 09/11/13 0000 09/11/13 0400 09/11/13 0515  BP: 153/60   137/63  Pulse: 93   91  Temp: 99.6 F (37.6 C)   97.9 F (36.6 C)  TempSrc:      Resp: 18 18 18 16   Height:      Weight:      SpO2: 100% 100% 100% 94%      Intake/Output from previous day: I/O last 3 completed shifts: In: 4730.6 [P.O.:720; I.V.:4010.6] Out: 1600 [Urine:1500; Drains:100]   Intake/Output this shift:     LABORATORY DATA:  Recent Labs  09/10/13 0545  09/10/13 1635 09/10/13 2114 09/11/13 0542 09/11/13 0635  WBC 10.4  --   --   --  9.7  --   HGB 10.8*  --   --   --  10.7*  --   HCT 31.6*  --   --   --  31.1*  --   PLT 207  --   --   --  195  --   NA 134*  --   --   --   --   --   K 3.9  --   --   --   --   --   CL 94*  --   --   --   --   --   CO2 28  --   --   --   --   --   BUN 6  --   --   --   --   --   CREATININE 0.58  --   --   --   --   --   GLUCOSE 260*  --   --   --   --   --   GLUCAP  --   < > 192* 199*  --  221*  CALCIUM 8.4  --   --   --   --   --   < > = values in this interval not displayed.  Examination: Neurologically intact Neurovascular intact Sensation intact distally Intact pulses distally Dorsiflexion/Plantar flexion intact Incision: dressing C/D/I No cellulitis present Compartment soft}  Assessment:   2 Days Post-Op Procedure(s) (LRB): TOTAL KNEE ARTHROPLASTY- left (Left) ADDITIONAL DIAGNOSIS:  Diabetes and Hypertension  Plan: PT/OT WBAT, CPM 5/hrs day until  ROM 0-90 degrees, then D/C CPM DVT Prophylaxis:  SCDx72hrs, ASA 325 mg BID x 2 weeks DISCHARGE PLAN: Skilled Nursing Facility/Rehab - [place] tomorrow if Pt passes PT goals. DISCHARGE NEEDS: HHPT, HHRN, CPM and 3-in-1 comode seat  Per Diabetes coordinator, added 25 units of NPH at bed time.     Caitlin Ainley R 09/11/2013, 7:47 AM

## 2013-09-11 NOTE — Progress Notes (Signed)
Orthopedic Tech Progress Note Patient Details:  Carrie White 28-May-1941 [id number] On cpm at 3:10 LLE 0-35 Patient ID: [name], female   DOB: 04-08-1941, 73 y.o.   MRN: [id number]   [name] 09/11/2013, 3:10 PM

## 2013-09-11 NOTE — Evaluation (Signed)
Occupational Therapy Evaluation Patient Details Name: VINETA CARONE MRN: [id number] DOB: [birth year] Today's Date: 09/11/2013 Time: 2353-6144 OT Time Calculation (min): 27 min  OT Assessment / Plan / Recommendation History of present illness Pt is a 73 y/o female admitted s/p L TKA.    Clinical Impression   Pt presents as above w/ significant deficits (see Problem list below) in her ability to perform ADL and functional mobility/transfers. She will benefit from acute OT followed by SNF Rehab to assist in maximizing independence w/ ADL's and functional mob/transfers prior to anticipated d/c home after STR.    OT Assessment  Patient needs continued OT Services    Follow Up Recommendations  SNF    Barriers to Discharge      Equipment Recommendations  Other (comment) (Defer to next venue)    Recommendations for Other Services    Frequency  Min 2X/week    Precautions / Restrictions Precautions Precautions: Fall;Knee Precaution Comments: Discussed towel roll under heel and NO pillow under knee. Restrictions Weight Bearing Restrictions: Yes LLE Weight Bearing: Weight bearing as tolerated   Pertinent Vitals/Pain Pt did not rated pain, however c/o L knee pain. Repositioned and resting in chair upon completion of OT treatment session.    ADL  Eating/Feeding: Performed;Set up Where Assessed - Eating/Feeding: Chair Grooming: Performed;Wash/dry face;Wash/dry hands;Modified independent Where Assessed - Grooming: Supported sitting Upper Body Bathing: Simulated;Set up Where Assessed - Upper Body Bathing: Supported sitting Lower Body Bathing: Simulated;Maximal assistance Where Assessed - Lower Body Bathing: Supported sitting;Supported sit to stand Upper Body Dressing: Performed;Set up Where Assessed - Upper Body Dressing: Unsupported sitting Lower Body Dressing: Simulated;Maximal assistance Where Assessed - Lower Body Dressing: Supported sit to stand Toilet Transfer: Simulated;Minimal  assistance (Pt stood from chair, performed functional mobility in room and returned to chair (declined bathroom0) Toilet Transfer Method: Sit to stand Toilet Transfer Equipment: Comfort height toilet Toileting - Clothing Manipulation and Hygiene: Minimal assistance Where Assessed - Best boy and Hygiene: Sit to stand from 3-in-1 or toilet Tub/Shower Transfer Method: Not assessed Equipment Used: Gait belt;Rolling walker Transfers/Ambulation Related to ADLs: Pt moves very slowly but Min A w/ RW. Pt requires consistent vc's & tc's for safety, sequencing and hand placement. ADL Comments: Pt was educated in role of OT. Discussed ADL's & A/E and use of LH A/E, pt reports that she went to joint class prior to surgery. Pt perform functional mobility in room from recliner chair to door and back given increased time for all activity.    OT Diagnosis: Generalized weakness;Acute pain  OT Problem List: Decreased activity tolerance;Impaired balance (sitting and/or standing);Decreased knowledge of precautions;Decreased knowledge of use of DME or AE;Pain OT Treatment Interventions: Self-care/ADL training;DME and/or AE instruction;Patient/family education;Visual/perceptual remediation/compensation;Therapeutic activities;Balance training   OT Goals(Current goals can be found in the care plan section) Acute Rehab OT Goals Patient Stated Goal: To d/c to SNF for continued rehab Time For Goal Achievement: 09/25/13 Potential to Achieve Goals: Good  Visit Information  Last OT Received On: 09/11/13 Assistance Needed: +1 History of Present Illness: Pt is a 73 y/o female admitted s/p L TKA.        Prior [place] expects to be discharged to:: Skilled nursing facility Living Arrangements: Alone Additional Comments: [place] Prior Function Level of Independence: Independent with assistive device(s) Communication Communication: No  difficulties Dominant Hand: Right        Cognition  Cognition Arousal/Alertness: Awake/alert Behavior During Therapy: WFL for tasks assessed/performed  Overall Cognitive Status: Within Functional Limits for tasks assessed    Extremity/Trunk Assessment Upper Extremity Assessment Upper Extremity Assessment: Overall WFL for tasks assessed Lower Extremity Assessment Lower Extremity Assessment: Defer to PT evaluation LLE Deficits / Details: Decreased strength and AROM consistent with TKA LLE: Unable to fully assess due to pain Cervical / Trunk Assessment Cervical / Trunk Assessment: Normal    Mobility Transfers Overall transfer level: Needs assistance Equipment used: Rolling walker (2 wheeled) Transfers: Sit to/from Stand Sit to Stand: Min assist General transfer comment: VC's for safety, sequencing and hand placement on seated surface for safety. Assist & verbal cuing to power up to full stand.         Balance Balance Overall balance assessment: Needs assistance Sitting-balance support: Feet supported;Bilateral upper extremity supported Sitting balance-Leahy Scale: Fair Standing balance support: Bilateral upper extremity supported Standing balance-Leahy Scale: Fair   End of Session OT - End of Session Equipment Utilized During Treatment: Gait belt;Rolling walker Activity Tolerance: Patient limited by pain;Patient limited by fatigue Patient left: in chair;with call bell/phone within reach;with family/visitor present CPM Left Knee CPM Left Knee: Off  GO     Almyra Deforest 09/11/2013, 11:52 AM

## 2013-09-12 LAB — GLUCOSE, CAPILLARY
GLUCOSE-CAPILLARY: 197 mg/dL — AB (ref 70–99)
Glucose-Capillary: 225 mg/dL — ABNORMAL HIGH (ref 70–99)
Glucose-Capillary: 148 mg/dL — ABNORMAL HIGH (ref 70–99)

## 2013-09-12 LAB — CBC
HCT: 31 % — ABNORMAL LOW (ref 36.0–46.0)
HEMOGLOBIN: 10.5 g/dL — AB (ref 12.0–15.0)
MCH: 30.3 pg (ref 26.0–34.0)
MCHC: 33.9 g/dL (ref 30.0–36.0)
MCV: 89.3 fL (ref 78.0–100.0)
Platelets: 203 10*3/uL (ref 150–400)
RBC: 3.47 MIL/uL — ABNORMAL LOW (ref 3.87–5.11)
RDW: 13.1 % (ref 11.5–15.5)
WBC: 8.9 10*3/uL (ref 4.0–10.5)

## 2013-09-12 MED ORDER — TIZANIDINE HCL 2 MG PO CAPS: 2.0000 mg | ORAL_CAPSULE | Freq: Three times a day (TID) | ORAL | Status: DC | PRN

## 2013-09-12 NOTE — Progress Notes (Signed)
Patient ID: Carrie White, female   DOB: 15-Jun-1941, 73 y.o.   MRN: [id number] PATIENT ID: Carrie White  MRN: [id number]  DOB/AGE:  13-Dec-1940 / 73 y.o.  3 Days Post-Op Procedure(s) (LRB): TOTAL KNEE ARTHROPLASTY- left (Left)    PROGRESS NOTE Subjective: Patient is alert, oriented, no Nausea, no Vomiting, yes passing gas, no Bowel Movement. Taking PO well. Denies SOB, Chest or Calf Pain. Using Incentive Spirometer, PAS in place. Ambulate 36ft, CPM 0-60 Patient reports pain as 4 on 0-10 scale  .    Objective: Vital signs in last 24 hours: Filed Vitals:   09/11/13 2007 09/12/13 0000 09/12/13 0400 09/12/13 0632  BP: 137/49   134/51  Pulse: 92   89  Temp: 98.6 F (37 C)   98.4 F (36.9 C)  TempSrc: Oral   Oral  Resp: 18 16 16 18   Height:      Weight:      SpO2: 96%   98%      Intake/Output from previous day: I/O last 3 completed shifts: In: 2378.9 [P.O.:960; I.V.:1418.9] Out: 350 [Urine:350]   Intake/Output this shift:     LABORATORY DATA:  Recent Labs  09/10/13 0545  09/11/13 0542  09/11/13 1643 09/11/13 2136 09/12/13 0615 09/12/13 0631  WBC 10.4  --  9.7  --   --   --  8.9  --   HGB 10.8*  --  10.7*  --   --   --  10.5*  --   HCT 31.6*  --  31.1*  --   --   --  31.0*  --   PLT 207  --  195  --   --   --  203  --   NA 134*  --   --   --   --   --   --   --   K 3.9  --   --   --   --   --   --   --   CL 94*  --   --   --   --   --   --   --   CO2 28  --   --   --   --   --   --   --   BUN 6  --   --   --   --   --   --   --   CREATININE 0.58  --   --   --   --   --   --   --   GLUCOSE 260*  --   --   --   --   --   --   --   GLUCAP  --   < >  --   < > 216* 186*  --  225*  CALCIUM 8.4  --   --   --   --   --   --   --   < > = values in this interval not displayed.  Examination: Neurologically intact ABD soft Neurovascular intact Sensation intact distally Intact pulses distally Dorsiflexion/Plantar flexion intact Incision: no drainage No cellulitis  present Compartment soft}  Assessment:   3 Days Post-Op Procedure(s) (LRB): TOTAL KNEE ARTHROPLASTY- left (Left) ADDITIONAL DIAGNOSIS:  Diabetes and Hypertension  Plan: PT/OT WBAT, CPM 5/hrs day until ROM 0-90 degrees, then D/C CPM, change dresing, DC IV DVT Prophylaxis:  SCDx72hrs, ASA 325 mg BID x 2 weeks DISCHARGE PLAN: Skilled Nursing Facility/Rehab, [facility] DISCHARGE NEEDS: HHPT, HHRN, CPM, Walker and 3-in-1 comode seat     [name] J 09/12/2013, 9:08 AM

## 2013-09-12 NOTE — Progress Notes (Signed)
Physical Therapy Treatment Patient Details Name: Carrie White MRN: [id number] DOB: 1941/08/15 Today's Date: 09/12/2013 Time: 4709-6283 PT Time Calculation (min): 39 min  PT Assessment / Plan / Recommendation  History of Present Illness Pt is a 73 y/o female admitted s/p L TKA.    PT Comments   Pt progressing with mobility.    Follow Up Recommendations  SNF     Does the patient have the potential to tolerate intense rehabilitation     Barriers to Discharge        Equipment Recommendations       Recommendations for Other Services    Frequency 7X/week   Progress towards PT Goals Progress towards PT goals: Progressing toward goals  Plan Current plan remains appropriate    Precautions / Restrictions Precautions Precautions: Fall;Knee Precaution Comments: Discussed towel roll under heel and NO pillow under knee. Restrictions LLE Weight Bearing: Weight bearing as tolerated   Pertinent Vitals/Pain "undescribable" in response to asking about pain.  Has had pain medication.  Repositioned for comfort.      Mobility  Bed Mobility Overal bed mobility: Needs Assistance Bed Mobility: Supine to Sit;Sit to Supine Supine to sit: Min assist Sit to supine: Mod assist General bed mobility comments: cues for technique.  (A) for LE's in/out of bed.   Transfers Overall transfer level: Needs assistance Equipment used: Rolling walker (2 wheeled) Transfers: Sit to/from Stand Sit to Stand: Min assist General transfer comment: cues for hand placement.   Ambulation/Gait Ambulation/Gait assistance: Min guard Ambulation Distance (Feet): 120 Feet Assistive device: Rolling walker (2 wheeled) Gait Pattern/deviations: Step-through pattern;Decreased stride length;Decreased weight shift to left;Decreased step length - right Gait velocity: Decreased Gait velocity interpretation: Below normal speed for age/gender General Gait Details: cues for sequencing, tall posture, & to look ahead      PT  Goals (current goals can now be found in the care plan section) Acute Rehab PT Goals Patient Stated Goal: To d/c to SNF for continued rehab PT Goal Formulation: With patient/family Time For Goal Achievement: 09/24/13 Potential to Achieve Goals: Good  Visit Information  Last PT Received On: 09/12/13 Assistance Needed: +1 History of Present Illness: Pt is a 73 y/o female admitted s/p L TKA.     Subjective Data  Subjective: "how long does it usually hurt for?" Patient Stated Goal: To d/c to SNF for continued rehab   Cognition  Cognition Arousal/Alertness: Awake/alert Behavior During Therapy: WFL for tasks assessed/performed Overall Cognitive Status: Within Functional Limits for tasks assessed    Balance     End of Session PT - End of Session Activity Tolerance: Patient tolerated treatment well Patient left: in bed;with call bell/phone within reach Nurse Communication: Mobility status   GP     Sena Hitch 09/12/2013, 12:46 PM   Sarajane Marek, PTA [phone] 09/12/2013

## 2013-09-12 NOTE — Discharge Summary (Signed)
Patient ID: Carrie White MRN: [id number] DOB/AGE: Feb 26, 1941 73 y.o.  Admit date: 09/09/2013 Discharge date: 09/12/2013  Admission Diagnoses:  Active Problems:   Arthritis of left knee   Discharge Diagnoses:  Same  Past Medical History  Diagnosis Date  . GOITER, MULTINODULAR 07/01/2010  . HYPERLIPIDEMIA 07/31/2008  . INSOMNIA, CHRONIC 06/29/2009  . RESTLESS LEG SYNDROME 05/07/2007  . HYPERTENSION 08/01/2008  . OSTEOARTHRITIS 05/07/2007  . FIBROMYALGIA 05/07/2007  . Diabetes mellitus     type II- uncontrolled  . Fibromyalgia   . Intraductal papilloma of breast 07/12/2011  . Complication of anesthesia   . PONV (postoperative nausea and vomiting)   . Depression   . GERD (gastroesophageal reflux disease)     Surgeries: Procedure(s): TOTAL KNEE ARTHROPLASTY- left on 09/09/2013   Consultants:    Discharged Condition: Improved  Hospital Course: Carrie White is an 73 y.o. female who was admitted 09/09/2013 for operative treatment of<principal problem not specified>. Patient has severe unremitting pain that affects sleep, daily activities, and work/hobbies. After pre-op clearance the patient was taken to the operating room on 09/09/2013 and underwent  Procedure(s): TOTAL KNEE ARTHROPLASTY- left.    Patient was given perioperative antibiotics: Anti-infectives   Start     Dose/Rate Route Frequency Ordered Stop   09/09/13 1123  cefUROXime (ZINACEF) injection  Status:  Discontinued       As needed 09/09/13 1048 09/09/13 1218   09/09/13 0600  ceFAZolin (ANCEF) IVPB 2 g/50 mL premix     2 g 100 mL/hr over 30 Minutes Intravenous On call to O.R. 09/08/13 1352 09/09/13 1033       Patient was given sequential compression devices, early ambulation, and chemoprophylaxis to prevent DVT.  Patient benefited maximally from hospital stay and there were no complications.    Recent vital signs: Patient Vitals for the past 24 hrs:  BP Temp Temp src Pulse Resp SpO2  09/12/13 0632 134/51 mmHg  98.4 F (36.9 C) Oral 89 18 98 %  09/12/13 0400 - - - - 16 -  09/12/13 0000 - - - - 16 -  09/11/13 2007 137/49 mmHg 98.6 F (37 C) Oral 92 18 96 %  09/11/13 2000 - - - - 16 98 %  09/11/13 1334 151/64 mmHg 98.2 F (36.8 C) - 91 18 94 %  09/11/13 1146 136/63 mmHg - - - - -     Recent laboratory studies:  Recent Labs  09/10/13 0545 09/11/13 0542 09/12/13 0615  WBC 10.4 9.7 8.9  HGB 10.8* 10.7* 10.5*  HCT 31.6* 31.1* 31.0*  PLT 207 195 203  NA 134*  --   --   K 3.9  --   --   CL 94*  --   --   CO2 28  --   --   BUN 6  --   --   CREATININE 0.58  --   --   GLUCOSE 260*  --   --   CALCIUM 8.4  --   --      Discharge Medications:     Medication List         acetaminophen-codeine 300-30 MG per tablet  Commonly known as:  TYLENOL #3  Take 1 tablet by mouth every morning.     albuterol 108 (90 BASE) MCG/ACT inhaler  Commonly known as:  PROVENTIL HFA;VENTOLIN HFA  Inhale 1 puff into the lungs every 6 (six) hours as needed for wheezing or shortness of breath.     aspirin EC 325  MG tablet  Take 1 tablet (325 mg total) by mouth 2 (two) times daily.     BAYER BREEZE 2 TEST Disk  Generic drug:  Glucose Blood  TEST 2 TIMES PER DAY     insulin lispro 100 UNIT/ML injection  Commonly known as:  HUMALOG  Inject 8-15 Units into the skin 3 (three) times daily before meals. Inject 15 units daily at breakfast, 10 units daily at lunch, and 8 units daily at dinner.     insulin NPH Human 100 UNIT/ML injection  Commonly known as:  HUMULIN N,NOVOLIN N  Inject 35 Units into the skin at bedtime.     Insulin Pen Needle 31G X 8 MM Misc  Commonly known as:  B-D ULTRAFINE III SHORT PEN  Use as directed three times a day dx 250.02     BD ULTRA-FINE PEN NEEDLES 29G X 12.7MM Misc  Generic drug:  Insulin Pen Needle  USE DAILY     lisinopril 10 MG tablet  Commonly known as:  PRINIVIL,ZESTRIL  Take 10 mg by mouth daily.     lisinopril 10 MG tablet  Commonly known as:  PRINIVIL,ZESTRIL   TAKE 1 TABLET BY MOUTH EVERY DAY     methocarbamol 500 MG tablet  Commonly known as:  ROBAXIN  Take 1 tablet (500 mg total) by mouth 2 (two) times daily with a meal.     naproxen sodium 220 MG tablet  Commonly known as:  ANAPROX  Take 440 mg by mouth 2 (two) times daily.     omeprazole 40 MG capsule  Commonly known as:  PRILOSEC  Take 40 mg by mouth daily.     oxyCODONE-acetaminophen 5-325 MG per tablet  Commonly known as:  ROXICET  Take 1 tablet by mouth every 4 (four) hours as needed.     rOPINIRole 3 MG tablet  Commonly known as:  REQUIP  Take 3 mg by mouth at bedtime.     sertraline 50 MG tablet  Commonly known as:  ZOLOFT  Take 2 tablets (100 mg total) by mouth daily.     simvastatin 40 MG tablet  Commonly known as:  ZOCOR  Take 40 mg by mouth at bedtime.     tizanidine 2 MG capsule  Commonly known as:  ZANAFLEX  Take 1 capsule (2 mg total) by mouth 3 (three) times daily as needed for muscle spasms.     traMADol 50 MG tablet  Commonly known as:  ULTRAM  Take 1 tablet (50 mg total) by mouth 2 (two) times daily.     vitamin C 1000 MG tablet  Take 2,000 mg by mouth 2 (two) times daily.     Vitamin D3 2000 UNITS Tabs  Take 2,000 Units by mouth daily.     vitamin E 400 UNIT capsule  Take 400 Units by mouth daily.        Diagnostic Studies: Dg Chest 2 View  09/02/2013   CLINICAL DATA:  Preop knee replacement  EXAM: CHEST  2 VIEW  COMPARISON:  07/17/2008  FINDINGS: The heart size is mildly enlarged. The mediastinal contours are within normal limits. Both lungs are clear. The visualized skeletal structures are remarkable for mild thoracic spondylosis.  IMPRESSION: 1. Cardiac enlargement. 2. No acute findings.   Electronically Signed   By: Kerby Moors M.D.   On: 09/02/2013 14:56   Dg Chest Port 1 View  09/09/2013   CLINICAL DATA:  Line placement  EXAM: PORTABLE CHEST - 1 VIEW  COMPARISON:  09/02/2013  FINDINGS: Right internal jugular vein central venous catheter  placed with its tip at the cavoatrial junction and no pneumothorax. Clear lungs. No pleural effusion. Cardiomegaly.  IMPRESSION: Right internal jugular central venous catheter placed with its tip at the cavoatrial junction and no pneumothorax.   Electronically Signed   By: Maryclare Bean M.D.   On: 09/09/2013 15:09    Disposition: 01-Home or Self Care      Discharge Orders   Future Appointments Provider Department Dept Phone   10/01/2013 10:30 AM Renato Shin, MD [facility] [phone]   Future Orders Complete By Expires   Call MD / Call 911  As directed    Comments:     If you experience chest pain or shortness of breath, CALL 911 and be transported to the hospital emergency room.  If you develope a fever above 101 F, pus (white drainage) or increased drainage or redness at the wound, or calf pain, call your surgeon's office.   Change dressing  As directed    Comments:     Change dressing on POD #5.  You may clean the incision with alcohol prior to redressing.   Constipation Prevention  As directed    Comments:     Drink plenty of fluids.  Prune juice may be helpful.  You may use a stool softener, such as Colace (over the counter) 100 mg twice a day.  Use MiraLax (over the counter) for constipation as needed.   CPM  As directed    Comments:     Continuous passive motion machine (CPM):      Use the CPM from 0 to 60 for 5 hours per day.      You may increase by 10 degrees per day.  You may break it up into 2 or 3 sessions per day.      Use CPM for 2 weeks or until you are told to stop.   Diet - low sodium heart healthy  As directed    Do not put a pillow under the knee. Place it under the heel.  As directed    Driving restrictions  As directed    Comments:     No driving for 2 weeks   Increase activity slowly as tolerated  As directed    Patient may shower  As directed    Comments:     You may shower without a dressing once there is no drainage.  Do not wash over  the wound.  If drainage remains, cover wound with plastic wrap and then shower.      Follow-up Information   Follow up with Kerin Salen, MD In 2 weeks.   Specialty:  Orthopedic Surgery   Contact information:   [place] [phone]       Follow up with Kerin Salen, MD In 1 week.   Specialty:  Orthopedic Surgery   Contact information:   [place] [address] [phone]        Signed: Kerin Salen 09/12/2013, 9:16 AM

## 2013-09-12 NOTE — Progress Notes (Signed)
Inpatient Diabetes Program Recommendations  AACE/ADA: New Consensus Statement on Inpatient Glycemic Control (2013)  Target Ranges:  Prepandial:   less than 140 mg/dL      Peak postprandial:   less than 180 mg/dL (1-2 hours)      Critically ill patients:  140 - 180 mg/dL  Results for CASTELLA, LERNER (MRN [id number]) as of 09/12/2013 13:51  Ref. Range 09/12/2013 06:31 09/12/2013 11:47  Glucose-Capillary Latest Range: 70-99 mg/dL 225 (H) 148 (H)   Inpatient Diabetes Program Recommendations Insulin - Basal: consider increasing NPH to 30 units  Thank you  Raoul Pitch BSN, RN,CDE Inpatient Diabetes Coordinator [phone] (team pager)

## 2013-09-13 ENCOUNTER — Other Ambulatory Visit: Payer: Self-pay | Admitting: *Deleted

## 2013-09-13 MED ORDER — ACETAMINOPHEN-CODEINE #3 300-30 MG PO TABS: 1.0000 | ORAL_TABLET | Freq: Every morning | ORAL | Status: DC

## 2013-09-13 NOTE — Telephone Encounter (Signed)
[facility] 

## 2013-09-18 ENCOUNTER — Non-Acute Institutional Stay (SKILLED_NURSING_FACILITY): Payer: Medicare PPO | Admitting: Internal Medicine

## 2013-09-18 DIAGNOSIS — IMO0002 Reserved for concepts with insufficient information to code with codable children: Secondary | ICD-10-CM

## 2013-09-18 DIAGNOSIS — M25562 Pain in left knee: Secondary | ICD-10-CM

## 2013-09-18 DIAGNOSIS — M1710 Unilateral primary osteoarthritis, unspecified knee: Secondary | ICD-10-CM

## 2013-09-18 DIAGNOSIS — M25569 Pain in unspecified knee: Secondary | ICD-10-CM

## 2013-09-18 DIAGNOSIS — I10 Essential (primary) hypertension: Secondary | ICD-10-CM

## 2013-09-18 DIAGNOSIS — E1049 Type 1 diabetes mellitus with other diabetic neurological complication: Secondary | ICD-10-CM

## 2013-09-18 DIAGNOSIS — M1712 Unilateral primary osteoarthritis, left knee: Secondary | ICD-10-CM

## 2013-09-24 ENCOUNTER — Other Ambulatory Visit: Payer: Self-pay | Admitting: *Deleted

## 2013-09-24 ENCOUNTER — Ambulatory Visit: Payer: Medicare PPO | Admitting: Endocrinology

## 2013-09-24 MED ORDER — HYDROMORPHONE HCL 2 MG PO TABS: ORAL_TABLET | ORAL | Status: DC

## 2013-09-24 MED ORDER — TRAMADOL HCL 50 MG PO TABS: ORAL_TABLET | ORAL | Status: DC

## 2013-09-24 NOTE — Telephone Encounter (Signed)
[facility] 

## 2013-09-29 ENCOUNTER — Other Ambulatory Visit: Payer: Self-pay | Admitting: Internal Medicine

## 2013-09-29 DIAGNOSIS — M25562 Pain in left knee: Secondary | ICD-10-CM | POA: Insufficient documentation

## 2013-09-29 NOTE — Progress Notes (Signed)
HISTORY & PHYSICAL  DATE: 09/18/2013   FACILITY: [facility]  LEVEL OF CARE: SNF (31)  ALLERGIES:  No Known Allergies  CHIEF COMPLAINT:  Manage left knee OA, DM & HTN  HISTORY OF PRESENT ILLNESS: Pt is a 73 y/o Caucasian female:  KNEE OSTEOARTHRITIS: Patient had a history of pain and functional disability in the knee due to end-stage osteoarthritis and has failed nonsurgical conservative treatments. Patient had worsening of pain with activity and weight bearing, pain that interfered with activities of daily living & pain with passive range of motion. Therefore patient underwent total knee arthroplasty and tolerated the procedure well. Patient is admitted to this facility for sort short-term rehabilitation. Patient c/o  knee pain.  DM:pt's DM remains stable.  Pt denies polyuria, polydipsia, polyphagia, changes in vision or hypoglycemic episodes.  No complications noted from the medication presently being used.  Last hemoglobin A1c is:not available.  HTN: Pt 's HTN remains stable.  Denies CP, sob, DOE, pedal edema, headaches, dizziness or visual disturbances.  No complications from the medications currently being used.  Last BP :143/69.  PAST MEDICAL HISTORY :  Past Medical History  Diagnosis Date  . GOITER, MULTINODULAR 07/01/2010  . HYPERLIPIDEMIA 07/31/2008  . INSOMNIA, CHRONIC 06/29/2009  . RESTLESS LEG SYNDROME 05/07/2007  . HYPERTENSION 08/01/2008  . OSTEOARTHRITIS 05/07/2007  . FIBROMYALGIA 05/07/2007  . Diabetes mellitus     type II- uncontrolled  . Fibromyalgia   . Intraductal papilloma of breast 07/12/2011  . Complication of anesthesia   . PONV (postoperative nausea and vomiting)   . Depression   . GERD (gastroesophageal reflux disease)     PAST SURGICAL HISTORY: Past Surgical History  Procedure Laterality Date  . Abdominal hysterectomy    . Oophorectomy    . Mouth surgery  6948,5462  . Colonoscopy    . Dilation and curettage of  uterus  1972  . Breast mass excision  07/18/11    left breast  . Mastectomy, partial  07/18/2011  . Breast surgery    . Total knee arthroplasty Right 09/09/2013    DR Mayer Camel  . Total knee arthroplasty Left 09/09/2013    Procedure: TOTAL KNEE ARTHROPLASTY- left;  Surgeon: Kerin Salen, MD;  Location: [facility];  Service: Orthopedics;  Laterality: Left;    SOCIAL HISTORY:  reports that she quit smoking about 44 years ago. Her smoking use included Cigarettes. She smoked 0.00 packs per day. She has never used smokeless tobacco. She reports that she does not drink alcohol or use illicit drugs.  FAMILY HISTORY:  Family History  Problem Relation Age of Onset  . Hypertension Mother   . Stroke Mother     CVA  . Cancer Father     Colon Cancer with Mets, Lung Cancer  . Cancer Sister     Breast Cancer  . Hypertension Brother   . Stroke Brother     CVA  . Cancer Brother     Prostate Cancer  . Cancer Sister     Breast Cancer  . Leukemia Sister   . Diabetes Sister   . Lupus Sister   . Diabetes Brother   . Thyroid disease Other     Brother (1974) thyroid surgery  . Thyroid disease Other     Sister (1981) thyroid surgery    CURRENT MEDICATIONS: Reviewed per [facility]  REVIEW OF SYSTEMS:  See HPI otherwise 14 point ROS is negative.  PHYSICAL EXAMINATION  VS:  T99        P 84       RR 18      BP 143/69       POX% 99      GENERAL: no acute distress, moderately obese body habitus EYES: conjunctivae normal, sclerae normal, normal eye lids MOUTH/THROAT: lips without lesions,no lesions in the mouth,tongue is without lesions,uvula elevates in midline NECK: supple, trachea midline, no neck masses, no thyroid tenderness, no thyromegaly LYMPHATICS: no LAN in the neck, no supraclavicular LAN RESPIRATORY: breathing is even & unlabored, BS CTAB CARDIAC: RRR, no murmur,no extra heart sounds, LLE +2  edema GI:  ABDOMEN: abdomen soft, normal BS, no masses, no tenderness  LIVER/SPLEEN: no hepatomegaly, no  splenomegaly MUSCULOSKELETAL: HEAD: normal to inspection & palpation BACK: no kyphosis, scoliosis or spinal processes tenderness EXTREMITIES: LEFT UPPER EXTREMITY: full range of motion, normal strength & tone RIGHT UPPER EXTREMITY:  full range of motion, normal strength & tone LEFT LOWER EXTREMITY:  range of motion not tested due to surgery, normal strength & tone RIGHT LOWER EXTREMITY:  full range of motion, normal strength & tone PSYCHIATRIC: the patient is alert & oriented to person, affect & behavior appropriate  LABS/RADIOLOGY:  Labs reviewed: Basic Metabolic Panel:  Recent Labs  06/05/13 1151 09/02/13 1348 09/10/13 0545  NA 143 142 134*  K 4.5 4.8 3.9  CL 105 101 94*  CO2 33* 29 28  GLUCOSE 147* 164* 260*  BUN 23 18 6   CREATININE 0.6 0.65 0.58  CALCIUM 9.3 9.3 8.4   Liver Function Tests:  Recent Labs  06/05/13 1151  AST 21  21  ALT 23  23  ALKPHOS 68  68  BILITOT 0.5  0.5  PROT 6.6  6.6  ALBUMIN 3.6  3.6   CBC:  Recent Labs  09/02/13 1348 09/10/13 0545 09/11/13 0542 09/12/13 0615  WBC 8.6 10.4 9.7 8.9  NEUTROABS 5.4  --   --   --   HGB 12.8 10.8* 10.7* 10.5*  HCT 38.0 31.6* 31.1* 31.0*  MCV 90.9 89.5 89.4 89.3  PLT 213 207 195 203   Lipid Panel:  Recent Labs  06/05/13 1151  HDL 55.70   CBG:  Recent Labs  09/12/13 0631 09/12/13 1147 09/12/13 1701  GLUCAP 225* 148* 197*    PORTABLE CHEST - 1 VIEW   COMPARISON:  09/02/2013   FINDINGS: Right internal jugular vein central venous catheter placed with its tip at the cavoatrial junction and no pneumothorax. Clear lungs. No pleural effusion. Cardiomegaly.   IMPRESSION: Right internal jugular central venous catheter placed with its tip at the cavoatrial junction and no pneumothorax.    ASSESSMENT/PLAN:  Left knee OA-s/p totla knee arthroplasty. HTN-BP borderline.  Will monitor. DM- continue current medications Left knee pain-uncontrolled.  Increase tramadol to 50mg   qid. Acute blood loss anemia-recheck GERD-stable. Check cbc  I have reviewed patient's medical records received at admission/from hospitalization.  CPT CODE: 66063

## 2013-09-30 ENCOUNTER — Non-Acute Institutional Stay (SKILLED_NURSING_FACILITY): Payer: Medicare PPO | Admitting: Adult Health

## 2013-09-30 ENCOUNTER — Other Ambulatory Visit: Payer: Self-pay | Admitting: *Deleted

## 2013-09-30 DIAGNOSIS — F411 Generalized anxiety disorder: Secondary | ICD-10-CM

## 2013-09-30 DIAGNOSIS — F32A Depression, unspecified: Secondary | ICD-10-CM

## 2013-09-30 DIAGNOSIS — F3289 Other specified depressive episodes: Secondary | ICD-10-CM

## 2013-09-30 DIAGNOSIS — F419 Anxiety disorder, unspecified: Secondary | ICD-10-CM

## 2013-09-30 DIAGNOSIS — F329 Major depressive disorder, single episode, unspecified: Secondary | ICD-10-CM

## 2013-09-30 MED ORDER — TRAMADOL HCL 50 MG PO TABS: ORAL_TABLET | ORAL | Status: DC

## 2013-09-30 NOTE — Telephone Encounter (Signed)
[facility] 

## 2013-10-01 ENCOUNTER — Ambulatory Visit: Payer: Medicare PPO | Admitting: Endocrinology

## 2013-10-02 ENCOUNTER — Ambulatory Visit: Payer: Medicare PPO | Admitting: Endocrinology

## 2013-10-03 ENCOUNTER — Encounter: Payer: Self-pay | Admitting: Adult Health

## 2013-10-03 DIAGNOSIS — F32A Depression, unspecified: Secondary | ICD-10-CM | POA: Insufficient documentation

## 2013-10-03 DIAGNOSIS — F329 Major depressive disorder, single episode, unspecified: Secondary | ICD-10-CM | POA: Insufficient documentation

## 2013-10-03 DIAGNOSIS — F419 Anxiety disorder, unspecified: Secondary | ICD-10-CM | POA: Insufficient documentation

## 2013-10-03 NOTE — Progress Notes (Signed)
Patient ID: Carrie White, female   DOB: 12-Aug-1941, 73 y.o.   MRN: [id number]         PROGRESS NOTE  DATE: 09/30/2013  FACILITY:  [facility]  LEVEL OF CARE: SNF (31)  Acute Visit  CHIEF COMPLAINT:  Manage Anxiety and Depression  HISTORY OF PRESENT ILLNESS: This a 73 year old female who has been noted to be tearful and very anxious. She is currently taking Zoloft for depression.  PAST MEDICAL HISTORY : Reviewed.  No changes.  CURRENT MEDICATIONS: Reviewed per [facility]  REVIEW OF SYSTEMS:  GENERAL: no change in appetite, no fatigue, no weight changes, no fever, chills or weakness RESPIRATORY: no cough, SOB, DOE,, wheezing, hemoptysis CARDIAC: no chest pain, edema or palpitations GI: no abdominal pain, diarrhea, constipation, heart burn, nausea or vomiting PSYCHIATRIC: has crying episodes  PHYSICAL EXAMINATION  GENERAL: no acute distress, normal body habitus NECK: supple, trachea midline, no neck masses, no thyroid tenderness, no thyromegaly RESPIRATORY: breathing is even & unlabored, BS CTAB CARDIAC: RRR, no murmur,no extra heart sounds, no edema GI: abdomen soft, normal BS, no masses, no tenderness, no hepatomegaly, no splenomegaly PSYCHIATRIC: the patient is alert & oriented to person, affect is sad & anxious  LABS/RADIOLOGY: Labs reviewed: Basic Metabolic Panel:  Recent Labs  06/05/13 1151 09/02/13 1348 09/10/13 0545  NA 143 142 134*  K 4.5 4.8 3.9  CL 105 101 94*  CO2 33* 29 28  GLUCOSE 147* 164* 260*  BUN 23 18 6   CREATININE 0.6 0.65 0.58  CALCIUM 9.3 9.3 8.4   Liver Function Tests:  Recent Labs  06/05/13 1151  AST 21  21  ALT 23  23  ALKPHOS 68  68  BILITOT 0.5  0.5  PROT 6.6  6.6  ALBUMIN 3.6  3.6    CBC:  Recent Labs  09/02/13 1348 09/10/13 0545 09/11/13 0542 09/12/13 0615  WBC 8.6 10.4 9.7 8.9  NEUTROABS 5.4  --   --   --   HGB 12.8 10.8* 10.7* 10.5*  HCT 38.0 31.6* 31.1* 31.0*  MCV 90.9 89.5 89.4 89.3  PLT  213 207 195 203   Lipid Panel:  Recent Labs  06/05/13 1151  HDL 55.70   CBG:  Recent Labs  09/12/13 0631 09/12/13 1147 09/12/13 1701  GLUCAP 225* 148* 197*      ASSESSMENT/PLAN:  Depression - increase Zoloft to 125 mg PO Q AM  Anxiety - start Ativan 0.5 mg 1 tab PO BID PRN   CPT CODE: 16384  Hunter Pinkard Vargas- NP [facility] [phone]

## 2013-10-11 ENCOUNTER — Non-Acute Institutional Stay (SKILLED_NURSING_FACILITY): Payer: Medicare PPO | Admitting: Adult Health

## 2013-10-11 ENCOUNTER — Encounter: Payer: Self-pay | Admitting: Adult Health

## 2013-10-11 DIAGNOSIS — F3289 Other specified depressive episodes: Secondary | ICD-10-CM

## 2013-10-11 DIAGNOSIS — G2581 Restless legs syndrome: Secondary | ICD-10-CM

## 2013-10-11 DIAGNOSIS — K219 Gastro-esophageal reflux disease without esophagitis: Secondary | ICD-10-CM

## 2013-10-11 DIAGNOSIS — I10 Essential (primary) hypertension: Secondary | ICD-10-CM

## 2013-10-11 DIAGNOSIS — M1710 Unilateral primary osteoarthritis, unspecified knee: Secondary | ICD-10-CM

## 2013-10-11 DIAGNOSIS — F411 Generalized anxiety disorder: Secondary | ICD-10-CM

## 2013-10-11 DIAGNOSIS — F419 Anxiety disorder, unspecified: Secondary | ICD-10-CM

## 2013-10-11 DIAGNOSIS — M1712 Unilateral primary osteoarthritis, left knee: Secondary | ICD-10-CM

## 2013-10-11 DIAGNOSIS — IMO0002 Reserved for concepts with insufficient information to code with codable children: Secondary | ICD-10-CM

## 2013-10-11 DIAGNOSIS — E1165 Type 2 diabetes mellitus with hyperglycemia: Secondary | ICD-10-CM

## 2013-10-11 DIAGNOSIS — IMO0001 Reserved for inherently not codable concepts without codable children: Secondary | ICD-10-CM

## 2013-10-11 DIAGNOSIS — F32A Depression, unspecified: Secondary | ICD-10-CM

## 2013-10-11 DIAGNOSIS — F329 Major depressive disorder, single episode, unspecified: Secondary | ICD-10-CM

## 2013-10-11 DIAGNOSIS — E785 Hyperlipidemia, unspecified: Secondary | ICD-10-CM

## 2013-10-12 NOTE — Progress Notes (Signed)
Patient ID: Carrie White, female   DOB: December 03, 1940, 73 y.o.   MRN: [id number]              PROGRESS NOTE  DATE: 10/11/2013   FACILITY: [facility]  LEVEL OF CARE: SNF (31)  Acute Visit  CHIEF COMPLAINT:  Discharge Notes  HISTORY OF PRESENT ILLNESS: This is a 73 year old female who is for discharge home with Home health PT and OT. DME: Rolling walker and 3-in-1 commode. 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 Patient was admitted to this facility for short-term rehabilitation after the patient's recent hospitalization.  Patient has completed SNF rehabilitation and therapy has cleared the patient for discharge.  Reassessment of ongoing problem(s):  HTN: Pt 's HTN remains stable.  Denies CP, sob, DOE, pedal edema, headaches, dizziness or visual disturbances.  No complications from the medications currently being used.  Last BP :116/59  DM:pt's DM remains stable.  Pt denies polyuria, polydipsia, polyphagia, changes in vision or hypoglycemic episodes.  No complications noted from the medication presently being used.  1/15 hemoglobin A1c 7.3  HYPERLIPIDEMIA: No complications from the medications presently being used. 10/14  fasting lipid panel showed : Cholesterol is 162 triglycerides 84 HDL 55.70 LDL 90  PAST MEDICAL HISTORY : Reviewed.  No changes.  CURRENT MEDICATIONS: Reviewed per [facility]  REVIEW OF SYSTEMS:  GENERAL: no change in appetite, no fatigue, no weight changes, no fever, chills or weakness RESPIRATORY: no cough, SOB, DOE, wheezing, hemoptysis CARDIAC: no chest pain, edema or palpitations GI: no abdominal pain, diarrhea, constipation, heart burn, nausea or vomiting  PHYSICAL EXAMINATION  GENERAL: no acute distress, normal body habitus NECK: supple, trachea midline, no neck masses, no thyroid tenderness, no thyromegaly LYMPHATICS: no LAN in the neck, no  supraclavicular LAN RESPIRATORY: breathing is even & unlabored, BS CTAB CARDIAC: RRR, no murmur,no extra heart sounds, no edema GI: abdomen soft, normal BS, no masses, no tenderness, no hepatomegaly, no splenomegaly PSYCHIATRIC: the patient is alert & oriented to person, affect & behavior appropriate  LABS/RADIOLOGY:  Labs reviewed: 09/19/13  Wbc 8.8  hgb 11.7  hct 53.6 Basic Metabolic Panel:  Recent Labs  06/05/13 1151 09/02/13 1348 09/10/13 0545  NA 143 142 134*  K 4.5 4.8 3.9  CL 105 101 94*  CO2 33* 29 28  GLUCOSE 147* 164* 260*  BUN 23 18 6   CREATININE 0.6 0.65 0.58  CALCIUM 9.3 9.3 8.4   Liver Function Tests:  Recent Labs  06/05/13 1151  AST 21  21  ALT 23  23  ALKPHOS 68  68  BILITOT 0.5  0.5  PROT 6.6  6.6  ALBUMIN 3.6  3.6    CBC:  Recent Labs  09/02/13 1348 09/10/13 0545 09/11/13 0542 09/12/13 0615  WBC 8.6 10.4 9.7 8.9  NEUTROABS 5.4  --   --   --   HGB 12.8 10.8* 10.7* 10.5*  HCT 38.0 31.6* 31.1* 31.0*  MCV 90.9 89.5 89.4 89.3  PLT 213 207 195 203   Lipid Panel:  Recent Labs  06/05/13 1151  HDL 55.70   CBG:  Recent Labs  09/12/13 0631 09/12/13 1147 09/12/13 1701  GLUCAP 225* 148* 197*    ASSESSMENT/PLAN:  Arthritis is status post left total knee arthroplasty - for home health PT and OT  Diabetes mellitus, type II uncontrolled - continue Humalog and NPH insulin Hyperlipidemia - well controlled  Restless leg syndrome - continue Requip GERD - stable Hypertension - well controlled Depression - recently increased Zoloft Anxiety -   recently started on Ativan when necessary    I have filled out patient's discharge paperwork and written prescriptions.  Patient will receive home health PT and OT.   DME provided: rolling walker and 3 in one commode  Total discharge time: Greater than 30 minutes Discharge time involved coordination of the discharge process with social worker, nursing staff and therapy department. Medical  justification for home health services/DME verified.  CPT CODE: 91916  Seth Bake  -  NP [facility] [phone]

## 2013-11-04 ENCOUNTER — Other Ambulatory Visit: Payer: Self-pay | Admitting: Internal Medicine

## 2013-11-07 ENCOUNTER — Other Ambulatory Visit: Payer: Self-pay | Admitting: Adult Health

## 2013-11-28 ENCOUNTER — Other Ambulatory Visit: Payer: Self-pay

## 2013-11-28 MED ORDER — INSULIN NPH (HUMAN) (ISOPHANE) 100 UNIT/ML ~~LOC~~ SUSP: 25.0000 [IU] | Freq: Every day | SUBCUTANEOUS | Status: DC

## 2013-12-03 ENCOUNTER — Other Ambulatory Visit: Payer: Self-pay | Admitting: *Deleted

## 2013-12-03 ENCOUNTER — Other Ambulatory Visit: Payer: Self-pay

## 2013-12-03 MED ORDER — INSULIN NPH (HUMAN) (ISOPHANE) 100 UNIT/ML ~~LOC~~ SUSP: 25.0000 [IU] | Freq: Every day | SUBCUTANEOUS | Status: DC

## 2013-12-03 NOTE — Telephone Encounter (Signed)
Received fax from pharmacy requesting pens instead of vials.

## 2013-12-06 ENCOUNTER — Telehealth: Payer: Self-pay

## 2013-12-06 MED ORDER — INSULIN ISOPHANE HUMAN 100 UNIT/ML KWIKPEN: 25.0000 [IU] | PEN_INJECTOR | Freq: Every day | SUBCUTANEOUS | Status: DC

## 2013-12-06 NOTE — Telephone Encounter (Signed)
Called pt and states she has had a total knee replacement and has been out of her medication since Monday. Pt states the earliest that she could come for OV is the second week in may.  Please advise, Thanks!

## 2013-12-06 NOTE — Telephone Encounter (Signed)
Pt scheduled for 12/18/2013.

## 2013-12-06 NOTE — Telephone Encounter (Signed)
Ov is due.  Let's address then 

## 2013-12-06 NOTE — Telephone Encounter (Signed)
Informed pt to stay on vial until she can come for OV.

## 2013-12-06 NOTE — Telephone Encounter (Signed)
Pt is requested insulin be changed to humulin kwikpen. Please advise if ok to change.  Thanks! Last appointment was 06/28/2013.

## 2013-12-06 NOTE — Telephone Encounter (Signed)
i changed to vials.  i have sent a prescription to your pharmacy, to refill x 1 Ov is due.

## 2013-12-06 NOTE — Telephone Encounter (Signed)
Pt called stating she D/C the rx for Humulin N  that was sent on 12/03/2013 because it was sent as vials. Pt is out of her medication and it worried because she will not be able to come in for OV till second week in [place].  Please advise if ok to send in RX for Humulin Kwikpen. Thanks!

## 2013-12-06 NOTE — Telephone Encounter (Signed)
Please continue the same insulin until then

## 2013-12-09 ENCOUNTER — Other Ambulatory Visit: Payer: Self-pay

## 2013-12-09 MED ORDER — OMEPRAZOLE 40 MG PO CPDR: 40.0000 mg | DELAYED_RELEASE_CAPSULE | Freq: Every day | ORAL | Status: DC

## 2013-12-09 MED ORDER — SIMVASTATIN 40 MG PO TABS: 40.0000 mg | ORAL_TABLET | Freq: Every day | ORAL | Status: DC

## 2013-12-09 MED ORDER — SERTRALINE HCL 50 MG PO TABS: 50.0000 mg | ORAL_TABLET | Freq: Every day | ORAL | Status: DC

## 2013-12-09 NOTE — Telephone Encounter (Signed)
[name] patient  06/05/13 saw Dr Linda Hedges Not sure when med was last filled

## 2013-12-09 NOTE — Telephone Encounter (Signed)
OK X1 

## 2013-12-18 ENCOUNTER — Ambulatory Visit (INDEPENDENT_AMBULATORY_CARE_PROVIDER_SITE_OTHER): Payer: Medicare PPO | Admitting: Endocrinology

## 2013-12-18 ENCOUNTER — Encounter: Payer: Self-pay | Admitting: Endocrinology

## 2013-12-18 VITALS — BP 110/62 | HR 80 | Temp 98.0°F | Ht 62.0 in | Wt 198.0 lb

## 2013-12-18 DIAGNOSIS — E1049 Type 1 diabetes mellitus with other diabetic neurological complication: Secondary | ICD-10-CM

## 2013-12-18 LAB — MICROALBUMIN / CREATININE URINE RATIO
Creatinine,U: 147.3 mg/dL
Microalb Creat Ratio: 12.6 mg/g (ref 0.0–30.0)
Microalb, Ur: 18.6 mg/dL — ABNORMAL HIGH (ref 0.0–1.9)

## 2013-12-18 LAB — HEMOGLOBIN A1C: Hgb A1c MFr Bld: 7.9 % — ABNORMAL HIGH (ref 4.6–6.5)

## 2013-12-18 NOTE — Patient Instructions (Signed)
blood and urine tests are being requested for you today.  We'll contact you with results. Please make a follow-up appointment in 3 months.  Please reduce the supper humalog to 5 units. Please call sooner if your blood sugar goes below 80, or if it stays over 200 at bedtime.  It is important to also check blood sugar at bedtime. check your blood sugar 2 times a day.  vary the time of day when you check, between before the 3 meals, and at bedtime.  also check if you have symptoms of your blood sugar being too high or too low.  please keep a record of the readings and bring it to your next appointment here.  please call us sooner if you are having low blood sugar episodes.

## 2013-12-18 NOTE — Progress Notes (Signed)
Subjective:    Patient ID: Carrie White, female    DOB: 1941-07-23, 73 y.o.   MRN: [id number]  HPI Pt returns for f/u of insulin-requiring DM (dx'ed 2008, when she presented with polyuria; she has moderate neuropathy of the lower extremities; no known associated complications; she has never had severe hypoglycemia or DKA; she takes multiple daily injections).   She feels much better, since her left TKR.   Last steroid injection (right knee) was 2 mos ago.  She was out of humalog x 2 weeks last month, due to a mix-up with her pharmacy.  This has been resolved.  she brings a record of her cbg's which i have reviewed today.  She has had 2 episodes of mild hypoglycemia.  Both were at hs.  There is otherwise no trend throughout the day.She has cbg's below 100 at all times of day.  Most are in the 200's.  She has lost a few lbs.  Past Medical History  Diagnosis Date  . GOITER, MULTINODULAR 07/01/2010  . HYPERLIPIDEMIA 07/31/2008  . INSOMNIA, CHRONIC 06/29/2009  . RESTLESS LEG SYNDROME 05/07/2007  . HYPERTENSION 08/01/2008  . OSTEOARTHRITIS 05/07/2007  . FIBROMYALGIA 05/07/2007  . Diabetes mellitus     type II- uncontrolled  . Fibromyalgia   . Intraductal papilloma of breast 07/12/2011  . Complication of anesthesia   . PONV (postoperative nausea and vomiting)   . Depression   . GERD (gastroesophageal reflux disease)     Past Surgical History  Procedure Laterality Date  . Abdominal hysterectomy    . Oophorectomy    . Mouth surgery  [id number]  . Colonoscopy    . Dilation and curettage of uterus  1972  . Breast mass excision  07/18/11    left breast  . Mastectomy, partial  07/18/2011  . Breast surgery    . Total knee arthroplasty Right 09/09/2013    DR Mayer Camel  . Total knee arthroplasty Left 09/09/2013    Procedure: TOTAL KNEE ARTHROPLASTY- left;  Surgeon: Kerin Salen, MD;  Location: [facility];  Service: Orthopedics;  Laterality: Left;    History   Social History  . Marital Status:  Widowed    Spouse Name: N/A    Number of Children: N/A  . Years of Education: N/A   Occupational History  . Not on file.   Social History Main Topics  . Smoking status: Former Smoker    Types: Cigarettes    Quit date: 07/13/1969  . Smokeless tobacco: Never Used     Comment: quit in the 70's;never smoked daily  . Alcohol Use: No  . Drug Use: No  . Sexual Activity: No   Other Topics Concern  . 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 acetaminophen-codeine (TYLENOL #3) 300-30 MG per tablet TAKE 1 TABLET BY MOUTH EVERY MORNING  30 tablet  1  . BAYER BREEZE 2 TEST DISK TEST 2 TIMES PER DAY  100 each  7  . BD ULTRA-FINE PEN NEEDLES 29G X 12.7MM MISC USE DAILY  100 each  1  . Insulin Isophane Human (HUMULIN N KWIKPEN) 100 UNIT/ML Kiwkpen Inject 25 Units into the skin at bedtime.  15 mL  0  . insulin lispro (HUMALOG) 100 UNIT/ML injection Inject 8-15 Units into  the skin 3 (three) times daily before meals. 15 units at breakfast, 10 units at lunch, and 5 units at dinner.      . Insulin Pen Needle (B-D ULTRAFINE III SHORT PEN) 31G X 8 MM MISC Use as directed three times a day dx 250.02  100 each  5  . lisinopril (PRINIVIL,ZESTRIL) 10 MG tablet Take 10 mg by mouth daily.      . methocarbamol (ROBAXIN) 500 MG tablet Take 1 tablet (500 mg total) by mouth 2 (two) times daily with a meal.  60 tablet  0  . omeprazole (PRILOSEC) 40 MG capsule TAKE ONE CAPSULE BY MOUTH EVERY DAY  30 capsule  5  . omeprazole (PRILOSEC) 40 MG capsule Take 1 capsule (40 mg total) by mouth daily. PT NEEDS NEW PCP FOR FURTHER REFILLS DR [name] RETIRED  30 capsule  5  . rOPINIRole (REQUIP) 3 MG tablet Take 3 mg by mouth at bedtime.      . sertraline (ZOLOFT) 50 MG tablet Take 1 tablet (50 mg total) by mouth daily.  30 tablet  0    . simvastatin (ZOCOR) 40 MG tablet Take 1 tablet (40 mg total) by mouth at bedtime. PT NEEDS NEW PCP FOR FURTHER REFILLS DR [name] RETIRED  30 tablet  5  . albuterol (PROVENTIL HFA;VENTOLIN HFA) 108 (90 BASE) MCG/ACT inhaler Inhale 1 puff into the lungs every 6 (six) hours as needed for wheezing or shortness of breath.      . Ascorbic Acid (VITAMIN C) 1000 MG tablet Take 2,000 mg by mouth 2 (two) times daily.      Marland Kitchen aspirin EC 325 MG tablet Take 1 tablet (325 mg total) by mouth 2 (two) times daily.  30 tablet  0  . Cholecalciferol (VITAMIN D3) 2000 UNITS TABS Take 2,000 Units by mouth daily.      Marland Kitchen HYDROmorphone (DILAUDID) 2 MG tablet Take one to two tablets by mouth every 4 hours as needed for pain  360 tablet  0  . LORazepam (ATIVAN) 0.5 MG tablet Take 0.5 mg by mouth 2 (two) times daily as needed for anxiety.      . naproxen sodium (ANAPROX) 220 MG tablet Take 440 mg by mouth 2 (two) times daily.      . OxyCODONE (OXYCONTIN) 10 mg T12A 12 hr tablet Take 10 mg by mouth every 12 (twelve) hours.      Marland Kitchen oxyCODONE-acetaminophen (ROXICET) 5-325 MG per tablet Take 1 tablet by mouth every 4 (four) hours as needed.  60 tablet  0  . tizanidine (ZANAFLEX) 2 MG capsule Take 1 capsule (2 mg total) by mouth 3 (three) times daily as needed for muscle spasms.  60 capsule  0  . traMADol (ULTRAM) 50 MG tablet Take two tablets by mouth every 6 hours as needed for pain  240 tablet  5  . vitamin E 400 UNIT capsule Take 400 Units by mouth daily.       No current facility-administered medications on file prior to visit.    No Known Allergies  Family History  Problem Relation Age of Onset  . Hypertension Mother   . Stroke Mother     CVA  . Cancer Father     Colon Cancer with Mets, Lung Cancer  . Cancer Sister     Breast Cancer  . Hypertension Brother   . Stroke Brother     CVA  . Cancer Brother     Prostate Cancer  . Cancer Sister  Breast Cancer  . Leukemia Sister   . Diabetes Sister   . Lupus  Sister   . Diabetes Brother   . Thyroid disease Other     Brother (1974) thyroid surgery  . Thyroid disease Other     Sister (1981) thyroid surgery    BP 110/62  Pulse 80  Temp(Src) 98 F (36.7 C) (Oral)  Ht 5\' 2"  (1.575 m)  Wt 198 lb (89.812 kg)  BMI 36.21 kg/m2  SpO2 98%  Review of Systems Denies weight change and LOC.     Objective:   Physical Exam VITAL SIGNS:  See vs page GENERAL: no distress  Lab Results  Component Value Date   HGBA1C 7.9* 12/18/2013      Assessment & Plan:  DM: overcontrolled.  This a1c id affected by the fact that she did not have insulin for 2 weeks. OA: steroid injections complicate the rx of DM.

## 2013-12-26 ENCOUNTER — Other Ambulatory Visit: Payer: Self-pay | Admitting: *Deleted

## 2013-12-26 MED ORDER — INSULIN PEN NEEDLE 31G X 8 MM MISC: Status: DC

## 2013-12-27 ENCOUNTER — Other Ambulatory Visit: Payer: Self-pay

## 2013-12-27 MED ORDER — INSULIN PEN NEEDLE 31G X 8 MM MISC: Status: DC

## 2014-01-07 ENCOUNTER — Encounter: Payer: Self-pay | Admitting: Internal Medicine

## 2014-01-15 ENCOUNTER — Other Ambulatory Visit: Payer: Self-pay

## 2014-01-15 MED ORDER — INSULIN ISOPHANE HUMAN 100 UNIT/ML KWIKPEN: 25.0000 [IU] | PEN_INJECTOR | Freq: Every day | SUBCUTANEOUS | Status: DC

## 2014-01-24 ENCOUNTER — Other Ambulatory Visit: Payer: Self-pay

## 2014-01-24 MED ORDER — GLUCOSE BLOOD VI DISK: DISK | Status: AC

## 2014-01-28 ENCOUNTER — Other Ambulatory Visit: Payer: Self-pay | Admitting: *Deleted

## 2014-01-28 MED ORDER — GLUCOSE BLOOD VI STRP: ORAL_STRIP | Status: AC

## 2014-01-28 MED ORDER — ACCU-CHEK AVIVA PLUS W/DEVICE KIT: PACK | Status: AC

## 2014-01-28 MED ORDER — ACCU-CHEK SOFTCLIX LANCETS MISC: Status: AC

## 2014-01-28 NOTE — Telephone Encounter (Signed)
Pt requests new meter, strips and lancets.

## 2014-03-06 ENCOUNTER — Other Ambulatory Visit: Payer: Self-pay | Admitting: *Deleted

## 2014-03-06 MED ORDER — LISINOPRIL 10 MG PO TABS: 10.0000 mg | ORAL_TABLET | Freq: Every day | ORAL | Status: DC

## 2014-03-20 ENCOUNTER — Other Ambulatory Visit: Payer: Self-pay | Admitting: *Deleted

## 2014-03-20 NOTE — Telephone Encounter (Signed)
Pt see Dr. Loraine Grip doe diabetes forwarding msg to [facility] his LPN...Johny Chess

## 2014-03-21 ENCOUNTER — Other Ambulatory Visit: Payer: Self-pay | Admitting: *Deleted

## 2014-03-21 MED ORDER — INSULIN PEN NEEDLE 29G X 12.7MM MISC: Status: DC

## 2014-03-25 ENCOUNTER — Ambulatory Visit: Payer: Medicare PPO | Admitting: Endocrinology

## 2014-05-30 ENCOUNTER — Other Ambulatory Visit: Payer: Self-pay

## 2014-07-22 ENCOUNTER — Other Ambulatory Visit: Payer: Self-pay | Admitting: Internal Medicine

## 2014-09-12 ENCOUNTER — Other Ambulatory Visit: Payer: Self-pay | Admitting: Endocrinology

## 2014-09-18 ENCOUNTER — Other Ambulatory Visit (HOSPITAL_COMMUNITY): Payer: Self-pay | Admitting: Orthopedic Surgery

## 2014-09-18 DIAGNOSIS — M25562 Pain in left knee: Secondary | ICD-10-CM

## 2014-09-26 ENCOUNTER — Encounter (HOSPITAL_COMMUNITY)
Admission: RE | Admit: 2014-09-26 | Discharge: 2014-09-26 | Disposition: A | Discharge status: Home / Self Care | Payer: Medicare PPO | Source: Ambulatory Visit | Attending: Orthopedic Surgery | Admitting: Orthopedic Surgery

## 2014-09-26 DIAGNOSIS — M25562 Pain in left knee: Secondary | ICD-10-CM | POA: Diagnosis not present

## 2014-09-26 MED ORDER — TECHNETIUM TC 99M MEDRONATE IV KIT
25.0000 | PACK | Freq: Once | INTRAVENOUS | Status: AC | PRN
  Administered 2014-09-26: 25 via INTRAVENOUS

## 2014-10-07 ENCOUNTER — Other Ambulatory Visit: Payer: Self-pay | Admitting: Endocrinology

## 2015-02-09 ENCOUNTER — Other Ambulatory Visit: Payer: Self-pay

## 2015-02-11 ENCOUNTER — Encounter: Payer: Self-pay | Admitting: Internal Medicine

## 2016-07-25 ENCOUNTER — Other Ambulatory Visit: Payer: Self-pay | Admitting: Endocrinology

## 2016-07-25 NOTE — Telephone Encounter (Signed)
please call patient: We got this refill request, but you have not been seen here recently.  Who do you see for DM?

## 2016-07-27 ENCOUNTER — Telehealth: Payer: Self-pay | Admitting: Endocrinology

## 2016-07-27 NOTE — Telephone Encounter (Signed)
Need refill of  BD ULTRA-FINE PEN NEEDLES 29G X 12.7MM MISC  [id number] - [place], [place] - [address] AT [address] [phone] (Phone) [phone] (Fax)

## 2016-07-27 NOTE — Telephone Encounter (Signed)
Please refill x 3 mos Ov is due 

## 2016-07-27 NOTE — Telephone Encounter (Signed)
Patient notified of message via voicemail. Requested a call back to confirm.

## 2016-07-27 NOTE — Telephone Encounter (Signed)
I contacted the patient and advised we have not seen the patient in over 2 years via voicemail. Patient advised we would need to see her or to have her PCP refill the pen needles.

## 2018-08-24 ENCOUNTER — Emergency Department (HOSPITAL_COMMUNITY): Payer: Medicare Other

## 2018-08-24 ENCOUNTER — Encounter (HOSPITAL_COMMUNITY): Payer: Self-pay

## 2018-08-24 ENCOUNTER — Other Ambulatory Visit: Payer: Self-pay

## 2018-08-24 ENCOUNTER — Inpatient Hospital Stay (HOSPITAL_COMMUNITY)
Admission: EM | Admit: 2018-08-24 | Discharge: 2018-08-30 | DRG: 190 | Disposition: A | Discharge status: Skilled Nursing Facility | Payer: Medicare Other | Attending: Internal Medicine | Admitting: Internal Medicine

## 2018-08-24 DIAGNOSIS — Z806 Family history of leukemia: Secondary | ICD-10-CM

## 2018-08-24 DIAGNOSIS — Z833 Family history of diabetes mellitus: Secondary | ICD-10-CM | POA: Diagnosis not present

## 2018-08-24 DIAGNOSIS — E1165 Type 2 diabetes mellitus with hyperglycemia: Secondary | ICD-10-CM | POA: Diagnosis present

## 2018-08-24 DIAGNOSIS — E1169 Type 2 diabetes mellitus with other specified complication: Secondary | ICD-10-CM | POA: Diagnosis present

## 2018-08-24 DIAGNOSIS — R05 Cough: Secondary | ICD-10-CM | POA: Diagnosis not present

## 2018-08-24 DIAGNOSIS — Z803 Family history of malignant neoplasm of breast: Secondary | ICD-10-CM | POA: Diagnosis not present

## 2018-08-24 DIAGNOSIS — Z832 Family history of diseases of the blood and blood-forming organs and certain disorders involving the immune mechanism: Secondary | ICD-10-CM | POA: Diagnosis not present

## 2018-08-24 DIAGNOSIS — I110 Hypertensive heart disease with heart failure: Secondary | ICD-10-CM | POA: Diagnosis present

## 2018-08-24 DIAGNOSIS — Z6836 Body mass index (BMI) 36.0-36.9, adult: Secondary | ICD-10-CM | POA: Diagnosis not present

## 2018-08-24 DIAGNOSIS — T380X5A Adverse effect of glucocorticoids and synthetic analogues, initial encounter: Secondary | ICD-10-CM | POA: Diagnosis present

## 2018-08-24 DIAGNOSIS — G2581 Restless legs syndrome: Secondary | ICD-10-CM | POA: Diagnosis present

## 2018-08-24 DIAGNOSIS — J45901 Unspecified asthma with (acute) exacerbation: Secondary | ICD-10-CM | POA: Diagnosis present

## 2018-08-24 DIAGNOSIS — F329 Major depressive disorder, single episode, unspecified: Secondary | ICD-10-CM | POA: Diagnosis present

## 2018-08-24 DIAGNOSIS — Z96653 Presence of artificial knee joint, bilateral: Secondary | ICD-10-CM | POA: Diagnosis present

## 2018-08-24 DIAGNOSIS — IMO0002 Reserved for concepts with insufficient information to code with codable children: Secondary | ICD-10-CM | POA: Diagnosis present

## 2018-08-24 DIAGNOSIS — Z800 Family history of malignant neoplasm of digestive organs: Secondary | ICD-10-CM

## 2018-08-24 DIAGNOSIS — I5033 Acute on chronic diastolic (congestive) heart failure: Secondary | ICD-10-CM | POA: Diagnosis present

## 2018-08-24 DIAGNOSIS — J4541 Moderate persistent asthma with (acute) exacerbation: Secondary | ICD-10-CM

## 2018-08-24 DIAGNOSIS — D72829 Elevated white blood cell count, unspecified: Secondary | ICD-10-CM | POA: Diagnosis present

## 2018-08-24 DIAGNOSIS — Z8249 Family history of ischemic heart disease and other diseases of the circulatory system: Secondary | ICD-10-CM | POA: Diagnosis not present

## 2018-08-24 DIAGNOSIS — J441 Chronic obstructive pulmonary disease with (acute) exacerbation: Secondary | ICD-10-CM | POA: Diagnosis not present

## 2018-08-24 DIAGNOSIS — R0603 Acute respiratory distress: Secondary | ICD-10-CM | POA: Diagnosis not present

## 2018-08-24 DIAGNOSIS — Z823 Family history of stroke: Secondary | ICD-10-CM | POA: Diagnosis not present

## 2018-08-24 DIAGNOSIS — J9621 Acute and chronic respiratory failure with hypoxia: Secondary | ICD-10-CM | POA: Diagnosis present

## 2018-08-24 DIAGNOSIS — R0602 Shortness of breath: Secondary | ICD-10-CM | POA: Diagnosis not present

## 2018-08-24 DIAGNOSIS — Z825 Family history of asthma and other chronic lower respiratory diseases: Secondary | ICD-10-CM

## 2018-08-24 DIAGNOSIS — Z7951 Long term (current) use of inhaled steroids: Secondary | ICD-10-CM

## 2018-08-24 DIAGNOSIS — K219 Gastro-esophageal reflux disease without esophagitis: Secondary | ICD-10-CM

## 2018-08-24 DIAGNOSIS — I10 Essential (primary) hypertension: Secondary | ICD-10-CM | POA: Diagnosis present

## 2018-08-24 DIAGNOSIS — Z7982 Long term (current) use of aspirin: Secondary | ICD-10-CM | POA: Diagnosis not present

## 2018-08-24 DIAGNOSIS — J4551 Severe persistent asthma with (acute) exacerbation: Secondary | ICD-10-CM | POA: Diagnosis present

## 2018-08-24 DIAGNOSIS — E785 Hyperlipidemia, unspecified: Secondary | ICD-10-CM | POA: Diagnosis present

## 2018-08-24 DIAGNOSIS — E11649 Type 2 diabetes mellitus with hypoglycemia without coma: Secondary | ICD-10-CM | POA: Diagnosis not present

## 2018-08-24 DIAGNOSIS — Z794 Long term (current) use of insulin: Secondary | ICD-10-CM

## 2018-08-24 DIAGNOSIS — Z791 Long term (current) use of non-steroidal anti-inflammatories (NSAID): Secondary | ICD-10-CM

## 2018-08-24 DIAGNOSIS — F419 Anxiety disorder, unspecified: Secondary | ICD-10-CM | POA: Diagnosis present

## 2018-08-24 LAB — BASIC METABOLIC PANEL
Anion gap: 11 (ref 5–15)
BUN: 28 mg/dL — ABNORMAL HIGH (ref 8–23)
CO2: 25 mmol/L (ref 22–32)
Calcium: 8.8 mg/dL — ABNORMAL LOW (ref 8.9–10.3)
Chloride: 101 mmol/L (ref 98–111)
Creatinine, Ser: 1.19 mg/dL — ABNORMAL HIGH (ref 0.44–1.00)
GFR calc Af Amer: 51 mL/min — ABNORMAL LOW (ref >60)
GFR calc non Af Amer: 44 mL/min — ABNORMAL LOW (ref >60)
Glucose, Bld: 205 mg/dL — ABNORMAL HIGH (ref 70–99)
POTASSIUM: 4.2 mmol/L (ref 3.5–5.1)
Sodium: 137 mmol/L (ref 135–145)

## 2018-08-24 LAB — CBC WITH DIFFERENTIAL/PLATELET
ABS IMMATURE GRANULOCYTES: 0.11 10*3/uL — AB (ref 0.00–0.07)
Basophils Absolute: 0 10*3/uL (ref 0.0–0.1)
Basophils Relative: 0 %
Eosinophils Absolute: 0 10*3/uL (ref 0.0–0.5)
Eosinophils Relative: 0 %
HCT: 41.5 % (ref 36.0–46.0)
HEMOGLOBIN: 13.7 g/dL (ref 12.0–15.0)
Immature Granulocytes: 1 %
LYMPHS PCT: 13 %
Lymphs Abs: 2 10*3/uL (ref 0.7–4.0)
MCH: 28.7 pg (ref 26.0–34.0)
MCHC: 33 g/dL (ref 30.0–36.0)
MCV: 86.8 fL (ref 80.0–100.0)
Monocytes Absolute: 0.7 10*3/uL (ref 0.1–1.0)
Monocytes Relative: 4 %
NEUTROS ABS: 13 10*3/uL — AB (ref 1.7–7.7)
Neutrophils Relative %: 82 %
Platelets: 189 10*3/uL (ref 150–400)
RBC: 4.78 MIL/uL (ref 3.87–5.11)
RDW: 13.7 % (ref 11.5–15.5)
WBC: 15.7 10*3/uL — ABNORMAL HIGH (ref 4.0–10.5)
nRBC: 0 % (ref 0.0–0.2)

## 2018-08-24 LAB — TSH: TSH: 0.21 u[IU]/mL — ABNORMAL LOW (ref 0.350–4.500)

## 2018-08-24 LAB — RESPIRATORY PANEL BY PCR

## 2018-08-24 LAB — I-STAT ARTERIAL BLOOD GAS, ED
Acid-Base Excess: 2 mmol/L (ref 0.0–2.0)
Bicarbonate: 24.5 mmol/L (ref 20.0–28.0)
O2 Saturation: 93 %
Patient temperature: 98.6
TCO2: 25 mmol/L (ref 22–32)
pCO2 arterial: 30.8 mmHg — ABNORMAL LOW (ref 32.0–48.0)
pH, Arterial: 7.509 — ABNORMAL HIGH (ref 7.350–7.450)
pO2, Arterial: 60 mmHg — ABNORMAL LOW (ref 83.0–108.0)

## 2018-08-24 LAB — BRAIN NATRIURETIC PEPTIDE
B Natriuretic Peptide: 18 pg/mL (ref 0.0–100.0)
B Natriuretic Peptide: 22.2 pg/mL (ref 0.0–100.0)

## 2018-08-24 LAB — I-STAT TROPONIN, ED: Troponin i, poc: 0.01 ng/mL (ref 0.00–0.08)

## 2018-08-24 LAB — GLUCOSE, CAPILLARY: Glucose-Capillary: 484 mg/dL — ABNORMAL HIGH (ref 70–99)

## 2018-08-24 MED ORDER — FERROUS SULFATE 325 (65 FE) MG PO TABS
325.0000 mg | ORAL_TABLET | Freq: Every day | ORAL | Status: DC
  Administered 2018-08-25 – 2018-08-30 (×6): 325 mg via ORAL
  Filled 2018-08-24 (×6): qty 1

## 2018-08-24 MED ORDER — TRAZODONE HCL 50 MG PO TABS
100.0000 mg | ORAL_TABLET | Freq: Every day | ORAL | Status: DC
  Administered 2018-08-24 – 2018-08-29 (×6): 100 mg via ORAL
  Filled 2018-08-24 (×6): qty 2

## 2018-08-24 MED ORDER — ALBUTEROL SULFATE (2.5 MG/3ML) 0.083% IN NEBU: 2.5000 mg | INHALATION_SOLUTION | Freq: Four times a day (QID) | RESPIRATORY_TRACT | Status: DC

## 2018-08-24 MED ORDER — INSULIN ASPART PROT & ASPART (70-30 MIX) 100 UNIT/ML ~~LOC~~ SUSP
50.0000 [IU] | Freq: Two times a day (BID) | SUBCUTANEOUS | Status: DC
  Administered 2018-08-24 – 2018-08-25 (×2): 50 [IU] via SUBCUTANEOUS
  Filled 2018-08-24: qty 10

## 2018-08-24 MED ORDER — FUROSEMIDE 20 MG PO TABS
40.0000 mg | ORAL_TABLET | Freq: Every day | ORAL | Status: DC
  Administered 2018-08-25: 40 mg via ORAL
  Filled 2018-08-24: qty 2

## 2018-08-24 MED ORDER — IOPAMIDOL (ISOVUE-370) INJECTION 76%
100.0000 mL | Freq: Once | INTRAVENOUS | Status: AC | PRN
  Administered 2018-08-24: 100 mL via INTRAVENOUS

## 2018-08-24 MED ORDER — LORAZEPAM 2 MG/ML IJ SOLN
0.5000 mg | INTRAMUSCULAR | Status: DC | PRN
  Administered 2018-08-26 – 2018-08-28 (×4): 1 mg via INTRAVENOUS
  Filled 2018-08-24 (×5): qty 1

## 2018-08-24 MED ORDER — HYDROCHLOROTHIAZIDE 25 MG PO TABS: 25.0000 mg | ORAL_TABLET | Freq: Every day | ORAL | Status: DC

## 2018-08-24 MED ORDER — IPRATROPIUM-ALBUTEROL 0.5-2.5 (3) MG/3ML IN SOLN
3.0000 mL | Freq: Once | RESPIRATORY_TRACT | Status: AC
  Administered 2018-08-24: 3 mL via RESPIRATORY_TRACT
  Filled 2018-08-24: qty 3

## 2018-08-24 MED ORDER — GLIMEPIRIDE 4 MG PO TABS
4.0000 mg | ORAL_TABLET | Freq: Two times a day (BID) | ORAL | Status: DC
  Administered 2018-08-25 – 2018-08-26 (×3): 4 mg via ORAL
  Filled 2018-08-24 (×3): qty 1

## 2018-08-24 MED ORDER — INSULIN ASPART 100 UNIT/ML ~~LOC~~ SOLN
0.0000 [IU] | Freq: Every day | SUBCUTANEOUS | Status: DC
  Administered 2018-08-24: 5 [IU] via SUBCUTANEOUS
  Administered 2018-08-25: 2 [IU] via SUBCUTANEOUS
  Administered 2018-08-26 – 2018-08-27 (×2): 5 [IU] via SUBCUTANEOUS
  Administered 2018-08-29: 2 [IU] via SUBCUTANEOUS

## 2018-08-24 MED ORDER — ASPIRIN EC 81 MG PO TBEC
81.0000 mg | DELAYED_RELEASE_TABLET | Freq: Every day | ORAL | Status: DC
  Administered 2018-08-25 – 2018-08-30 (×6): 81 mg via ORAL
  Filled 2018-08-24 (×6): qty 1

## 2018-08-24 MED ORDER — SIMVASTATIN 20 MG PO TABS
40.0000 mg | ORAL_TABLET | Freq: Every day | ORAL | Status: DC
  Administered 2018-08-24 – 2018-08-29 (×6): 40 mg via ORAL
  Filled 2018-08-24 (×6): qty 2

## 2018-08-24 MED ORDER — ENOXAPARIN SODIUM 40 MG/0.4ML ~~LOC~~ SOLN
40.0000 mg | SUBCUTANEOUS | Status: DC
  Administered 2018-08-24 – 2018-08-29 (×6): 40 mg via SUBCUTANEOUS
  Filled 2018-08-24 (×7): qty 0.4

## 2018-08-24 MED ORDER — LISINOPRIL 10 MG PO TABS
10.0000 mg | ORAL_TABLET | Freq: Every day | ORAL | Status: DC
  Administered 2018-08-25 – 2018-08-27 (×3): 10 mg via ORAL
  Filled 2018-08-24 (×3): qty 1

## 2018-08-24 MED ORDER — POLYETHYLENE GLYCOL 3350 17 G PO PACK: 17.0000 g | PACK | Freq: Every day | ORAL | Status: DC | PRN

## 2018-08-24 MED ORDER — SODIUM CHLORIDE 0.9% FLUSH: 3.0000 mL | INTRAVENOUS | Status: DC | PRN

## 2018-08-24 MED ORDER — INSULIN LISPRO PROT & LISPRO (75-25 MIX) 100 UNIT/ML KWIKPEN: 50.0000 [IU] | PEN_INJECTOR | Freq: Two times a day (BID) | SUBCUTANEOUS | Status: DC

## 2018-08-24 MED ORDER — POTASSIUM CHLORIDE IN NACL 20-0.9 MEQ/L-% IV SOLN
INTRAVENOUS | Status: DC
  Administered 2018-08-24: 21:00:00 via INTRAVENOUS
  Filled 2018-08-24 (×2): qty 1000

## 2018-08-24 MED ORDER — ALBUTEROL (5 MG/ML) CONTINUOUS INHALATION SOLN
10.0000 mg/h | INHALATION_SOLUTION | RESPIRATORY_TRACT | Status: DC
  Administered 2018-08-24: 10 mg/h via RESPIRATORY_TRACT
  Filled 2018-08-24: qty 20

## 2018-08-24 MED ORDER — ALBUTEROL SULFATE (2.5 MG/3ML) 0.083% IN NEBU: 2.5000 mg | INHALATION_SOLUTION | RESPIRATORY_TRACT | Status: DC | PRN

## 2018-08-24 MED ORDER — INSULIN ASPART 100 UNIT/ML ~~LOC~~ SOLN
0.0000 [IU] | Freq: Three times a day (TID) | SUBCUTANEOUS | Status: DC
  Administered 2018-08-25: 3 [IU] via SUBCUTANEOUS
  Administered 2018-08-25: 11 [IU] via SUBCUTANEOUS
  Administered 2018-08-26: 20 [IU] via SUBCUTANEOUS
  Administered 2018-08-26: 11 [IU] via SUBCUTANEOUS
  Administered 2018-08-27: 4 [IU] via SUBCUTANEOUS
  Administered 2018-08-27: 7 [IU] via SUBCUTANEOUS
  Administered 2018-08-27: 20 [IU] via SUBCUTANEOUS
  Administered 2018-08-28: 7 [IU] via SUBCUTANEOUS
  Administered 2018-08-28: 11 [IU] via SUBCUTANEOUS
  Administered 2018-08-28: 15 [IU] via SUBCUTANEOUS
  Administered 2018-08-29: 4 [IU] via SUBCUTANEOUS
  Administered 2018-08-29 – 2018-08-30 (×2): 15 [IU] via SUBCUTANEOUS

## 2018-08-24 MED ORDER — ACETAMINOPHEN 650 MG RE SUPP: 650.0000 mg | Freq: Four times a day (QID) | RECTAL | Status: DC | PRN

## 2018-08-24 MED ORDER — MELOXICAM 7.5 MG PO TABS
7.5000 mg | ORAL_TABLET | Freq: Every day | ORAL | Status: DC
  Administered 2018-08-25 – 2018-08-26 (×2): 7.5 mg via ORAL
  Filled 2018-08-24 (×2): qty 1

## 2018-08-24 MED ORDER — PANTOPRAZOLE SODIUM 40 MG PO TBEC
80.0000 mg | DELAYED_RELEASE_TABLET | Freq: Two times a day (BID) | ORAL | Status: DC
  Administered 2018-08-24 – 2018-08-30 (×12): 80 mg via ORAL
  Filled 2018-08-24 (×13): qty 2

## 2018-08-24 MED ORDER — METHYLPREDNISOLONE SODIUM SUCC 125 MG IJ SOLR: 60.0000 mg | Freq: Four times a day (QID) | INTRAMUSCULAR | Status: DC

## 2018-08-24 MED ORDER — MOMETASONE FURO-FORMOTEROL FUM 200-5 MCG/ACT IN AERO
2.0000 | INHALATION_SPRAY | Freq: Two times a day (BID) | RESPIRATORY_TRACT | Status: DC
  Administered 2018-08-24 – 2018-08-30 (×8): 2 via RESPIRATORY_TRACT
  Filled 2018-08-24 (×2): qty 8.8

## 2018-08-24 MED ORDER — MAGNESIUM SULFATE 2 GM/50ML IV SOLN
2.0000 g | Freq: Once | INTRAVENOUS | Status: AC
  Administered 2018-08-24: 2 g via INTRAVENOUS
  Filled 2018-08-24: qty 50

## 2018-08-24 MED ORDER — INSULIN ASPART 100 UNIT/ML ~~LOC~~ SOLN: 6.0000 [IU] | Freq: Three times a day (TID) | SUBCUTANEOUS | Status: DC

## 2018-08-24 MED ORDER — HYDROCOD POLST-CPM POLST ER 10-8 MG/5ML PO SUER: 5.0000 mL | Freq: Two times a day (BID) | ORAL | Status: DC | PRN

## 2018-08-24 MED ORDER — SODIUM CHLORIDE 0.9% FLUSH
3.0000 mL | Freq: Two times a day (BID) | INTRAVENOUS | Status: DC
  Administered 2018-08-25 – 2018-08-28 (×3): 3 mL via INTRAVENOUS

## 2018-08-24 MED ORDER — SODIUM CHLORIDE 0.9 % IV BOLUS
1000.0000 mL | Freq: Once | INTRAVENOUS | Status: AC
  Administered 2018-08-24: 1000 mL via INTRAVENOUS

## 2018-08-24 MED ORDER — PANTOPRAZOLE SODIUM 40 MG PO TBEC: 40.0000 mg | DELAYED_RELEASE_TABLET | Freq: Every day | ORAL | Status: DC

## 2018-08-24 MED ORDER — IOPAMIDOL (ISOVUE-370) INJECTION 76%
INTRAVENOUS | Status: AC
  Filled 2018-08-24: qty 100

## 2018-08-24 MED ORDER — MONTELUKAST SODIUM 10 MG PO TABS
10.0000 mg | ORAL_TABLET | Freq: Every day | ORAL | Status: DC
  Administered 2018-08-24 – 2018-08-29 (×6): 10 mg via ORAL
  Filled 2018-08-24 (×6): qty 1

## 2018-08-24 MED ORDER — SODIUM CHLORIDE 0.9% FLUSH
3.0000 mL | Freq: Two times a day (BID) | INTRAVENOUS | Status: DC
  Administered 2018-08-25 – 2018-08-30 (×10): 3 mL via INTRAVENOUS

## 2018-08-24 MED ORDER — ROPINIROLE HCL 1 MG PO TABS
4.0000 mg | ORAL_TABLET | Freq: Every day | ORAL | Status: DC
  Administered 2018-08-24 – 2018-08-29 (×6): 4 mg via ORAL
  Filled 2018-08-24 (×6): qty 4

## 2018-08-24 MED ORDER — GABAPENTIN 300 MG PO CAPS
300.0000 mg | ORAL_CAPSULE | Freq: Two times a day (BID) | ORAL | Status: DC
  Administered 2018-08-24 – 2018-08-30 (×12): 300 mg via ORAL
  Filled 2018-08-24 (×12): qty 1

## 2018-08-24 MED ORDER — TRAMADOL HCL 50 MG PO TABS
100.0000 mg | ORAL_TABLET | Freq: Four times a day (QID) | ORAL | Status: DC | PRN
  Administered 2018-08-27 – 2018-08-29 (×2): 100 mg via ORAL
  Filled 2018-08-24 (×2): qty 2

## 2018-08-24 MED ORDER — PREDNISONE 20 MG PO TABS
20.0000 mg | ORAL_TABLET | Freq: Every day | ORAL | Status: DC
  Administered 2018-08-25 – 2018-08-26 (×2): 20 mg via ORAL
  Filled 2018-08-24 (×2): qty 1

## 2018-08-24 MED ORDER — ACETAMINOPHEN 325 MG PO TABS: 650.0000 mg | ORAL_TABLET | Freq: Four times a day (QID) | ORAL | Status: DC | PRN

## 2018-08-24 MED ORDER — SERTRALINE HCL 100 MG PO TABS
100.0000 mg | ORAL_TABLET | Freq: Every day | ORAL | Status: DC
  Administered 2018-08-25 – 2018-08-30 (×6): 100 mg via ORAL
  Filled 2018-08-24 (×6): qty 1

## 2018-08-24 MED ORDER — METHYLPREDNISOLONE SODIUM SUCC 125 MG IJ SOLR
125.0000 mg | Freq: Once | INTRAMUSCULAR | Status: AC
  Administered 2018-08-24: 125 mg via INTRAVENOUS
  Filled 2018-08-24: qty 2

## 2018-08-24 MED ORDER — PANTOPRAZOLE SODIUM 40 MG PO TBEC: 80.0000 mg | DELAYED_RELEASE_TABLET | Freq: Every day | ORAL | Status: DC

## 2018-08-24 MED ORDER — SODIUM CHLORIDE 0.9 % IV SOLN: 250.0000 mL | INTRAVENOUS | Status: DC | PRN

## 2018-08-24 NOTE — ED Provider Notes (Signed)
[facility] EMERGENCY DEPARTMENT Provider Note   CSN: [id number] Arrival date & time: 08/24/18  1209     History   Chief Complaint Chief Complaint  Patient presents with  . Cough  . Shortness of Breath    HPI Carrie White is a 78 y.o. female.  Pt presents to the ED today with SOB.  She has had SOB for several weeks.  She has been to see her doctor and has been prescribed multiple rounds of abx.  She has been on steroids and on Daliresp.  The pt has been on nebulizers and tussionex.  She is not improving, so her doctor sent her here today.  Pt denies f/c.  She is diabetic and said her blood sugar has been elevated due to steroids.        Past Medical History:  Diagnosis Date  . Complication of anesthesia   . Depression   . Diabetes mellitus    type II- uncontrolled  . FIBROMYALGIA 05/07/2007  . Fibromyalgia   . GERD (gastroesophageal reflux disease)   . GOITER, MULTINODULAR 07/01/2010  . HYPERLIPIDEMIA 07/31/2008  . HYPERTENSION 08/01/2008  . INSOMNIA, CHRONIC 06/29/2009  . Intraductal papilloma of breast 07/12/2011  . OSTEOARTHRITIS 05/07/2007  . PONV (postoperative nausea and vomiting)   . RESTLESS LEG SYNDROME 05/07/2007    Patient Active Problem List   Diagnosis Date Noted  . Restless leg syndrome 10/11/2013  . Depression 10/03/2013  . Anxiety 10/03/2013  . Knee pain, left 09/29/2013  . Arthritis of left knee 09/09/2013  . Type I (juvenile type) diabetes mellitus with neurological manifestations, not stated as uncontrolled(250.61) 06/27/2013  . De Quervain's tenosynovitis, bilateral 06/26/2013  . Osteoarthritis, knee 06/05/2013  . Lateral epicondylitis (tennis elbow) 06/05/2013  . Routine health maintenance 10/23/2012  . Grief 10/22/2012  . GOITER, MULTINODULAR 07/01/2010  . DYSURIA 10/30/2009  . VAGINITIS, CANDIDAL 10/28/2009  . DIARRHEA, PERSISTENT 09/25/2009  . INSOMNIA, CHRONIC 06/29/2009  . UNS ADVRS EFF UNS RX  MEDICINAL&BIOLOGICAL SBSTNC 02/24/2009  . DIABETES MELLITUS, TYPE II, UNCONTROLLED 08/01/2008  . HYPERTENSION 08/01/2008  . HYPERLIPIDEMIA 07/31/2008  . RESTLESS LEG SYNDROME 05/07/2007  . OSTEOARTHRITIS 05/07/2007  . FIBROMYALGIA 05/07/2007    Past Surgical History:  Procedure Laterality Date  . ABDOMINAL HYSTERECTOMY    . BREAST MASS EXCISION  07/18/11   left breast  . BREAST SURGERY    . COLONOSCOPY    . [place] OF UTERUS  1972  . MASTECTOMY, PARTIAL  07/18/2011  . MOUTH SURGERY  [id number]  . OOPHORECTOMY    . TOTAL KNEE ARTHROPLASTY Right 09/09/2013   DR Mayer Camel  . TOTAL KNEE ARTHROPLASTY Left 09/09/2013   Procedure: TOTAL KNEE ARTHROPLASTY- left;  Surgeon: Kerin Salen, MD;  Location: [facility];  Service: Orthopedics;  Laterality: Left;     OB History   No obstetric history on file.      Home Medications    Prior to Admission medications   Medication Sig Start Date End Date Taking? Authorizing Provider  ACCU-CHEK SOFTCLIX LANCETS lancets Use to test blood sugar 2 times daily as instructed. Dx code: 250.61 01/28/14   Renato Shin, MD  acetaminophen-codeine (TYLENOL #3) 300-30 MG per tablet TAKE 1 TABLET BY MOUTH EVERY MORNING 11/04/13   [name], Heinz Knuckles, MD  albuterol (PROVENTIL HFA;VENTOLIN HFA) 108 (90 BASE) MCG/ACT inhaler Inhale 1 puff into the lungs every 6 (six) hours as needed for wheezing or shortness of breath.    [provider]  Ascorbic Acid (VITAMIN C) 1000 MG tablet Take 2,000 mg by mouth 2 (two) times daily.    [provider]  aspirin EC 325 MG tablet Take 1 tablet (325 mg total) by mouth 2 (two) times daily. 09/11/13   Leighton Parody, PA-C  BD ULTRA-FINE PEN NEEDLES 29G X 12.7MM MISC USE DAILY 10/07/14   Renato Shin, MD  BD ULTRA-FINE PEN NEEDLES 29G X 12.7MM MISC USE DAILY 07/29/16   Renato Shin, MD  Blood Glucose Monitoring Suppl (ACCU-CHEK AVIVA PLUS) W/DEVICE KIT Use to check blood sugar daily. Dx code: 250.61 01/28/14    Renato Shin, MD  Cholecalciferol (VITAMIN D3) 2000 UNITS TABS Take 2,000 Units by mouth daily.    [provider]  glucose blood (ACCU-CHEK AVIVA PLUS) test strip Use to test blood sugar 2 times daily as instructed. Dx code: 250.61 01/28/14   Renato Shin, MD  Glucose Blood (BAYER BREEZE 2 TEST) DISK TEST 2 TIMES PER DAY 01/24/14   Renato Shin, MD  HYDROmorphone (DILAUDID) 2 MG tablet Take one to two tablets by mouth every 4 hours as needed for pain 09/24/13   Estill Dooms, MD  insulin lispro (HUMALOG) 100 UNIT/ML injection Inject 8-15 Units into the skin 3 (three) times daily before meals. 15 units at breakfast, 10 units at lunch, and 5 units at dinner.    [provider]  Insulin NPH, Human,, Isophane, (HUMULIN N KWIKPEN) 100 UNIT/ML Kiwkpen Inject 25 Units into the skin at bedtime. 01/15/14   Renato Shin, MD  lisinopril (PRINIVIL,ZESTRIL) 10 MG tablet Take 1 tablet (10 mg total) by mouth daily. 03/06/14   Rowe Clack, MD  LORazepam (ATIVAN) 0.5 MG tablet Take 0.5 mg by mouth 2 (two) times daily as needed for anxiety.    [provider]  methocarbamol (ROBAXIN) 500 MG tablet Take 1 tablet (500 mg total) by mouth 2 (two) times daily with a meal. 09/11/13   Leighton Parody, PA-C  naproxen sodium (ANAPROX) 220 MG tablet Take 440 mg by mouth 2 (two) times daily.    [provider]  omeprazole (PRILOSEC) 40 MG capsule TAKE ONE CAPSULE BY MOUTH EVERY DAY    [name], Heinz Knuckles, MD  omeprazole (PRILOSEC) 40 MG capsule Take 1 capsule (40 mg total) by mouth daily. PT NEEDS NEW PCP FOR FURTHER REFILLS DR [name] RETIRED 12/09/13   [name], Heinz Knuckles, MD  OxyCODONE (OXYCONTIN) 10 mg T12A 12 hr tablet Take 10 mg by mouth every 12 (twelve) hours.    [provider]  oxyCODONE-acetaminophen (ROXICET) 5-325 MG per tablet Take 1 tablet by mouth every 4 (four) hours as needed. 09/11/13   Leighton Parody, PA-C  rOPINIRole (REQUIP) 3 MG tablet Take 3 mg by mouth at  bedtime.    [provider]  sertraline (ZOLOFT) 50 MG tablet Take 1 tablet (50 mg total) by mouth daily. 12/09/13   Hendricks Limes, MD  simvastatin (ZOCOR) 40 MG tablet Take 1 tablet (40 mg total) by mouth at bedtime. PT NEEDS NEW PCP FOR FURTHER REFILLS DR [name] RETIRED 12/09/13   [name], Heinz Knuckles, MD  tizanidine (ZANAFLEX) 2 MG capsule Take 1 capsule (2 mg total) by mouth 3 (three) times daily as needed for muscle spasms. 09/12/13   Frederik Pear, MD  traMADol Veatrice Bourbon) 50 MG tablet Take two tablets by mouth every 6 hours as needed for pain 09/30/13   Reed, Tiffany L, DO  vitamin E 400 UNIT capsule Take 400 Units by mouth  daily.    [provider]    Family History Family History  Problem Relation Age of Onset  . Hypertension Mother   . Stroke Mother        CVA  . Cancer Father        Colon Cancer with Mets, Lung Cancer  . Leukemia Sister   . Diabetes Sister   . Cancer Sister        Breast Cancer  . Hypertension Brother   . Stroke Brother        CVA  . Cancer Brother        Prostate Cancer  . Cancer Sister        Breast Cancer  . Lupus Sister   . Diabetes Brother   . Thyroid disease Other        Brother (1974) thyroid surgery  . Thyroid disease Other        Sister (1981) thyroid surgery    Social History Social History   Tobacco Use  . Smoking status: Former Smoker    Types: Cigarettes    Last attempt to quit: 07/13/1969    Years since quitting: 49.1  . Smokeless tobacco: Never Used  . Tobacco comment: quit in the 70's;never smoked daily  Substance Use Topics  . Alcohol use: No  . Drug use: No     Allergies   Patient has no known allergies.   Review of Systems Review of Systems  Respiratory: Positive for cough, shortness of breath and wheezing.   All other systems reviewed and are negative.    Physical Exam Updated Vital Signs BP 135/68   Pulse 77   Temp 98 F (36.7 C) (Oral)   Resp 20   SpO2 94%   Physical Exam Vitals signs  and nursing note reviewed.  Constitutional:      Appearance: She is well-developed. She is obese.  HENT:     Head: Normocephalic and atraumatic.     Mouth/Throat:     Mouth: Mucous membranes are moist.     Pharynx: Oropharynx is clear.  Eyes:     Extraocular Movements: Extraocular movements intact.     Pupils: Pupils are equal, round, and reactive to light.  Neck:     Musculoskeletal: Normal range of motion and neck supple.  Cardiovascular:     Rate and Rhythm: Normal rate and regular rhythm.  Pulmonary:     Effort: Tachypnea and accessory muscle usage present.     Breath sounds: Wheezing present.  Abdominal:     General: Bowel sounds are normal.     Palpations: Abdomen is soft.  Musculoskeletal:     Right lower leg: Edema present.     Left lower leg: Edema present.  Skin:    General: Skin is warm and dry.     Capillary Refill: Capillary refill takes less than 2 seconds.  Neurological:     General: No focal deficit present.     Mental Status: She is alert and oriented to person, place, and time.  Psychiatric:        Mood and Affect: Mood normal.        Behavior: Behavior normal.      ED Treatments / Results  Labs (all labs ordered are listed, but only abnormal results are displayed) Labs Reviewed  BASIC METABOLIC PANEL - Abnormal; Notable for the following components:      Result Value   Glucose, Bld 205 (*)    BUN 28 (*)    Creatinine,  Ser 1.19 (*)    Calcium 8.8 (*)    GFR calc non Af Amer 44 (*)    GFR calc Af Amer 51 (*)    All other components within normal limits  CBC WITH DIFFERENTIAL/PLATELET - Abnormal; Notable for the following components:   WBC 15.7 (*)    Neutro Abs 13.0 (*)    Abs Immature Granulocytes 0.11 (*)    All other components within normal limits  BRAIN NATRIURETIC PEPTIDE  I-STAT TROPONIN, ED  I-STAT ARTERIAL BLOOD GAS, ED    EKG EKG Interpretation  Date/Time:  Friday August 24 2018 12:48:29 EST Ventricular Rate:  84 PR  Interval:    QRS Duration: 90 QT Interval:  363 QTC Calculation: 430 R Axis:   12 Text Interpretation:  Sinus rhythm Low voltage, precordial leads Abnormal R-wave progression, early transition Minimal ST elevation, inferior leads No significant change since last tracing Confirmed by Isla Pence [id number]) on 08/24/2018 1:24:44 PM   Radiology Dg Chest 2 View  Result Date: 08/24/2018 CLINICAL DATA:  Cough and cold symptoms for several weeks EXAM: CHEST - 2 VIEW COMPARISON:  08/13/2018 FINDINGS: Cardiac shadow is mildly enlarged. The lungs are clear bilaterally. No sizable effusion is seen. Degenerative changes of the thoracic spine are noted. IMPRESSION: No active cardiopulmonary disease. Electronically Signed   By: Inez Catalina M.D.   On: 08/24/2018 13:14   Ct Angio Chest Pe W And/or Wo Contrast  Result Date: 08/24/2018 CLINICAL DATA:  Ct chest pe, Pt reports cold symptoms, cough, generalized weakness for 5 weeks per pt. Pt seen by PCP and treated with steroids and abx without relief EXAM: CT ANGIOGRAPHY CHEST WITH CONTRAST TECHNIQUE: Multidetector CT imaging of the chest was performed using the standard protocol during bolus administration of intravenous contrast. Multiplanar CT image reconstructions and MIPs were obtained to evaluate the vascular anatomy. CONTRAST:  14m ISOVUE-370 IOPAMIDOL (ISOVUE-370) INJECTION 76% COMPARISON:  None. FINDINGS: Cardiovascular: Heart size upper limits normal. No pericardial effusion. RV is nondilated. Mildly dilated central pulmonary arteries. Satisfactory opacification of pulmonary arteries noted, and there is no evidence of pulmonary emboli. Scattered coronary calcifications. Adequate contrast opacification of the thoracic aorta with no evidence of dissection, aneurysm, or stenosis. There is classic 3-vessel brachiocephalic arch anatomy without proximal stenosis. Scattered calcified plaque in the distal arch and descending thoracic segment. Mediastinum/Nodes: No  hilar or mediastinal adenopathy. Lungs/Pleura: No pleural effusion. No pneumothorax. Mild dependent atelectasis posteriorly in both lungs. No nodule or focal infiltrate. Upper Abdomen: No acute findings. Musculoskeletal: Anterior vertebral endplate spurring at multiple levels in the mid and lower thoracic spine. No fracture or worrisome bone lesion. Review of the MIP images confirms the above findings. IMPRESSION: 1. Negative for acute PE or thoracic aortic dissection. 2. Atherosclerosis, including aortic and coronary artery disease. Please note that although the presence of coronary artery calcium documents the presence of coronary artery disease, the severity of this disease and any potential stenosis cannot be assessed on this non-gated CT examination. Assessment for potential risk factor modification, dietary therapy or pharmacologic therapy may be warranted, if clinically indicated. Electronically Signed   By: DLucrezia EuropeM.D.   On: 08/24/2018 14:47    Procedures Procedures (including critical care time)  Medications Ordered in ED Medications  albuterol (PROVENTIL,VENTOLIN) solution continuous neb (0 mg/hr Nebulization Stopped 08/24/18 1533)  magnesium sulfate IVPB 2 g 50 mL (2 g Intravenous New Bag/Given 08/24/18 1520)  sodium chloride 0.9 % bolus 1,000 mL (0 mLs Intravenous Stopped 08/24/18 1349)  ipratropium-albuterol (DUONEB) 0.5-2.5 (3) MG/3ML nebulizer solution 3 mL (3 mLs Nebulization Given 08/24/18 1240)  methylPREDNISolone sodium succinate (SOLU-MEDROL) 125 mg/2 mL injection 125 mg (125 mg Intravenous Given 08/24/18 1245)  iopamidol (ISOVUE-370) 76 % injection 100 mL (100 mLs Intravenous Contrast Given 08/24/18 1409)     Initial Impression / Assessment and Plan / ED Course  I have reviewed the triage vital signs and the nursing notes.  Pertinent labs & imaging results that were available during my care of the patient were reviewed by me and considered in my medical decision making (see  chart for details).    Neb/steroids helped only a small amount.  Pt ambulated with good O2 sats, but is very sob and tachypneic with walking.  Wheezing worse after walking.  I added a continuous neb with magnesium.  Pt d/w Dr. Evangeline Gula (triad) for admission.  She requested an ABG which was ordered.  CRITICAL CARE Performed by: Isla Pence   Total critical care time: 30 minutes  Critical care time was exclusive of separately billable procedures and treating other patients.  Critical care was necessary to treat or prevent imminent or life-threatening deterioration.  Critical care was time spent personally by me on the following activities: development of treatment plan with patient and/or surrogate as well as nursing, discussions with consultants, evaluation of patient's response to treatment, examination of patient, obtaining history from patient or surrogate, ordering and performing treatments and interventions, ordering and review of laboratory studies, ordering and review of radiographic studies, pulse oximetry and re-evaluation of patient's condition.  Final Clinical Impressions(s) / ED Diagnoses   Final diagnoses:  Moderate persistent asthma with exacerbation    ED Discharge Orders    None       Isla Pence, MD 08/24/18 [phone]

## 2018-08-24 NOTE — ED Notes (Signed)
Per MD order pulse ox while ambulating. Pulse range 102-108 bpm; O2 range 96-98% RA. Pt ambulated w/assistance; c/o of weakness and had continuous heavy cough and wheezing during ambulation. RN and MD advised. [employer]

## 2018-08-24 NOTE — ED Notes (Signed)
ED Provider at bedside. 

## 2018-08-24 NOTE — ED Notes (Signed)
Pt given sandwich and diet coke, ok by EDP

## 2018-08-24 NOTE — Consult Note (Signed)
Carrie White, MRN:  [id number], DOB:  May 11, 1941, LOS: 0 ADMISSION DATE:  08/24/2018, CONSULTATION DATE: August 24, 2018 REFERRING MD: Dr. Evangeline Gula, CHIEF COMPLAINT: Dyspnea  Brief History   78 year old female with a past medical history significant for asthma came to the emergency department on August 24, 2018 complaining of 5 weeks of cough and shortness of breath.  Pulmonary and critical care medicine was consulted for management of possible refractory asthma.  History of present illness   This is a 78 year old female who tells me that she never had any past medical history for lung disease until about 2 years ago when she was diagnosed with asthma and recurrent bronchitis.  She says that she smoked socially when she was a young adult and 1 pack of cigarettes would last her a month, she quit in 1970.  She worked as a Network engineer for years.  She says that her mother had asthma and bronchitis as well.  She tells me that throughout her adult life she has not had problems with respiratory problems until about 2 years ago when she was diagnosed with asthma.  She has not seen a pulmonologist yet and it is not clear to me that she has had pulmonary function testing.  She has been treated for recurrent episodes of bronchitis and her medication list includes long-acting controller asthma medicines as well as montelukast.  In the last 5 to 6 weeks she has had a persistent cough with associated shortness of breath and wheezing.  She denies any sort of fever, chills or body aches.  She does say that her chest is sore when she coughs.  She has had leg swelling off and on over the last several months.  She says that she has seen her primary care physician several times and has received treatment with prednisone as well as antibiotics.  She says that her weight has actually decreased over the last 2 to 3 months but her leg swelling has increased.  She came to the emergency room today because she went to go see  her primary care physician and was complaining of severe shortness of breath and cough and the primary care physician said that it was time for her to come to the emergency room because her symptoms are so severe and refractory to outpatient management.  Here in the emergency department she has been given bronchodilators and is being admitted to the hospitalist service who has consulted [place] for further evaluation.  She tells me that she is never had a cardiac condition that she is aware of and she has never seen a cardiologist.  Past Medical History  Diabetes mellitus type 2 Hypertension Severe persistent asthma  Significant Hospital Events   January 10 admitted  Consults:  Pulmonology  Procedures:  None  Significant Diagnostic Tests:  CT angiogram chest images independently reviewed from August 24, 2018: No pulmonary embolism, no significant pulmonary parenchymal abnormality noted, some coronary calcifications  Micro Data:  resp viral panel >   Antimicrobials:      Interim history/subjective:  As above  Objective   Blood pressure 135/68, pulse 77, temperature 98 F (36.7 C), temperature source Oral, resp. rate 20, SpO2 94 %.       No intake or output data in the 24 hours ending 08/24/18 1722 There were no vitals filed for this visit.  Examination:  Gen: chronically ill appearing, anxious HENT: NCAT, OP clear, neck supple without masses Eyes: PERRL, EOMi Lymph: no cervical lymphadenopathy PULM:  Clear, upper airway wheezing only, good air movement CV: tachy, regular, no mgr, no JVD GI: BS+, soft, nontender, no hsm Derm: no rash or skin breakdown MSK: normal bulk and tone Neuro: A&Ox4, CN II-XII intact, strength 5/5 in all 4 extremities Psyche: normal mood and affect    Resolved Hospital Problem list     Assessment & Plan:  78 year old female with a past medical history significant for asthma (no PFTs, no formal work-up available for my review) presents with  the subjective complaints of dyspnea and cough.  Her physical exam is essentially completely normal with the exception of an upper airway wheeze.  It is not clear to me that she has any underlying lung disease, I suspect that she may have acid reflux driven vocal cord dysfunction and vocal cord related wheezing.  Anxiety (definitely a factor) will make this problem worse.  Because she is able to speak to me in full sentences I do not think she has a fixed upper airway abnormality.  She is not showing signs of respiratory failure on physical exam.  She does have leg edema and given her age and comorbid illnesses I think it is reasonable to assess for heart failure.    Plan: Shortness of breath: Would use albuterol as needed I would favor using low-dose prednisone as I worry that this may perpetuate anxiety, 20 mg daily Needs pulmonary function testing Would benefit from exhaled nitric oxide testing when off of prednisone  Leg swelling: Broad differential diagnosis including chronic venous stasis, less likely heart failure (I do not see any pulmonary edema on the CT scan of her chest) Check BNP Consider echocardiogram Check urine protein Check TSH Consider compression stockings  Anxiety: Low-dose Ativan as needed  Gastroesophageal reflux disease: Start Protonix daily  Cough: Could benefit from voice rest, cough suppressive therapy Start Tussionex as needed  Pulmonary and critical care medicine is available as needed, if her condition worsens or if she develops objective abnormality such as hypoxemia were happy to see her again.  We will arrange for outpatient follow-up    Best practice:  Per [facility]  Labs   CBC: Recent Labs  Lab 08/24/18 1225  WBC 15.7*  NEUTROABS 13.0*  HGB 13.7  HCT 41.5  MCV 86.8  PLT 008    Basic Metabolic Panel: Recent Labs  Lab 08/24/18 1225  NA 137  K 4.2  CL 101  CO2 25  GLUCOSE 205*  BUN 28*  CREATININE 1.19*  CALCIUM 8.8*   GFR: CrCl  cannot be calculated (Unknown ideal weight.). Recent Labs  Lab 08/24/18 1225  WBC 15.7*    Liver Function Tests: No results for input(s): AST, ALT, ALKPHOS, BILITOT, PROT, ALBUMIN in the last 168 hours. No results for input(s): LIPASE, AMYLASE in the last 168 hours. No results for input(s): AMMONIA in the last 168 hours.  ABG    Component Value Date/Time   PHART 7.509 (H) 08/24/2018 1612   PCO2ART 30.8 (L) 08/24/2018 1612   PO2ART 60.0 (L) 08/24/2018 1612   HCO3 24.5 08/24/2018 1612   TCO2 25 08/24/2018 1612   O2SAT 93.0 08/24/2018 1612     Coagulation Profile: No results for input(s): INR, PROTIME in the last 168 hours.  Cardiac Enzymes: No results for input(s): CKTOTAL, CKMB, CKMBINDEX, TROPONINI in the last 168 hours.  HbA1C: Hgb A1c MFr Bld  Date/Time Value Ref Range Status  12/18/2013 11:27 AM 7.9 (H) 4.6 - 6.5 % Final    Comment:    Glycemic  Control Guidelines for People with Diabetes:Non Diabetic:  <6%Goal of Therapy: <7%Additional Action Suggested:  >8%   09/10/2013 06:10 AM 7.3 (H) <5.7 % Final    Comment:    (NOTE)                                                                       According to the ADA Clinical Practice Recommendations for 2011, when HbA1c is used as a screening test:  >=6.5%   Diagnostic of Diabetes Mellitus           (if abnormal result is confirmed) 5.7-6.4%   Increased risk of developing Diabetes Mellitus References:Diagnosis and Classification of Diabetes Mellitus,Diabetes [id number],9485,46(EVOJJ 1):S62-S69 and Standards of Medical Care in         Diabetes - 2011,Diabetes KKXF,8182,99 (Suppl 1):S11-S61.    CBG: No results for input(s): GLUCAP in the last 168 hours.  Roselie Awkward, MD [facility] Pager: [phone] Cell: [phone] If no response, call [phone]

## 2018-08-24 NOTE — ED Notes (Signed)
Admitting at bedside 

## 2018-08-24 NOTE — ED Notes (Signed)
Patient transported to X-ray 

## 2018-08-24 NOTE — ED Notes (Signed)
Pt returned from CT °

## 2018-08-24 NOTE — H&P (Signed)
History and Physical    Carrie White YYT:[id number] DOB: [birth year] DOA: 08/24/2018  PCP: Rodena Medin, MD (Inactive)  Patient coming from: home  I have personally briefly reviewed patient's old medical records in [place]  Chief Complaint: Asthma exacerbation for 1 month with shortness of breath and wheezing  HPI: Carrie White is a 78 y.o. female with medical history significant of Beatties type II, hypertension, and severe persistent asthma presents the emergency department with persistent wheezing and recurrent bronchitis.  She has been treated for about a month by her outpatient physician with antibiotics and steroids and has had no relief.  She continues to be tachypneic and very short of breath.  She has failed outpatient therapy and referred to me for further evaluation and management. ED Course: Chest x-ray does not show pneumonia, CT scan is okay, arterial blood gas is noted.  He has a pulmonologist, Dr. for at [facility]  He was seen by pulmonary who reported that she had no symptoms of asthma until 2 years ago when she was diagnosed with asthma and recurrent bronchitis.  His evaluation was quite interesting and recommended that the patient probably has gastroesophageal reflux disease with recurrent wheezing due to acid reflux bathing her vocal cords.  After [place] also felt that she had significant anxiety but she is able to speak in full sentences.  Does not appear to have a fixed upper airway abnormality.  Please see his consult for recommendations.  Review of Systems: As per HPI otherwise all other systems reviewed and  negative.    Past Medical History:  Diagnosis Date  . Complication of anesthesia   . Depression   . Diabetes mellitus    type II- uncontrolled  . FIBROMYALGIA 05/07/2007  . Fibromyalgia   . GERD (gastroesophageal reflux disease)   . GOITER, MULTINODULAR 07/01/2010  . HYPERLIPIDEMIA 07/31/2008  . HYPERTENSION 08/01/2008  .  INSOMNIA, CHRONIC 06/29/2009  . Intraductal papilloma of breast 07/12/2011  . OSTEOARTHRITIS 05/07/2007  . PONV (postoperative nausea and vomiting)   . RESTLESS LEG SYNDROME 05/07/2007    Past Surgical History:  Procedure Laterality Date  . ABDOMINAL HYSTERECTOMY    . BREAST MASS EXCISION  07/18/11   left breast  . BREAST SURGERY    . COLONOSCOPY    . [place] OF UTERUS  1972  . MASTECTOMY, PARTIAL  07/18/2011  . MOUTH SURGERY  [id number]  . OOPHORECTOMY    . TOTAL KNEE ARTHROPLASTY Right 09/09/2013   DR Mayer Camel  . 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 Her smoking use included cigarettes. She has never used smokeless tobacco. She reports that she does not drink alcohol or use drugs.  No Known Allergies  Family History  Problem Relation Age of Onset  . Hypertension Mother   . Stroke Mother        CVA  . Cancer Father        Colon Cancer with Mets, Lung Cancer  . Leukemia Sister   . Diabetes Sister   . Cancer Sister        Breast  Cancer  . Hypertension Brother   . Stroke Brother        CVA  . Cancer Brother        Prostate Cancer  . Cancer Sister        Breast Cancer  . Lupus Sister   . Diabetes Brother   . Thyroid disease Other        Brother (1974) thyroid surgery  . Thyroid disease Other        Sister (1981) thyroid surgery     Prior to Admission medications   Medication Sig Start Date End Date Taking? Authorizing Provider  albuterol (PROVENTIL HFA;VENTOLIN HFA) 108 (90 BASE) MCG/ACT inhaler Inhale 1 puff into the lungs every 6 (six) hours as needed for wheezing or shortness of breath.   Yes [provider]    aspirin EC 325 MG tablet Take 1 tablet (325 mg total) by mouth 2 (two) times daily. Patient taking differently: Take 81 mg by mouth daily.  09/11/13  Yes Joanell Rising K, PA-C  budesonide-formoterol [facility]) 160-4.5 MCG/ACT inhaler Inhale 2 puffs into the lungs 2 (two) times daily. 08/13/18 08/13/19 Yes [provider]  Dulaglutide (TRULICITY) 3.79 KW/4.0XB SOPN Inject 0.75 mg into the skin once a week. 07/06/18  Yes [provider]  ferrous sulfate 325 (65 FE) MG tablet Take 325 mg by mouth daily with breakfast.   Yes [provider]  furosemide (LASIX) 20 MG tablet Take 40 mg by mouth daily.   Yes [provider]  gabapentin (NEURONTIN) 100 MG capsule Take 300 mg by mouth 2 (two) times daily.   Yes [provider]  glimepiride (AMARYL) 4 MG tablet Take 4 mg by mouth 2 (two) times daily.   Yes [provider]  hydrochlorothiazide (HYDRODIURIL) 25 MG tablet Take 25 mg by mouth daily.   Yes [provider]  Insulin Lispro Prot & Lispro (HUMALOG MIX 75/25 KWIKPEN) (75-25) 100 UNIT/ML Kwikpen Inject 50 Units into the skin 2 (two) times daily. 04/28/16  Yes [provider]  lisinopril (PRINIVIL,ZESTRIL) 10 MG tablet Take 1 tablet (10 mg total) by mouth daily. 03/06/14  Yes Rowe Clack, MD  meloxicam (MOBIC) 7.5 MG tablet Take 7.5 mg by mouth daily. 01/22/18  Yes [provider]  metFORMIN (GLUCOPHAGE-XR) 500 MG 24 hr tablet Take 1,000 mg by mouth at bedtime.   Yes [provider]  montelukast (SINGULAIR) 10 MG tablet Take 10 mg by mouth at bedtime.   Yes [provider]  omeprazole (PRILOSEC) 40 MG capsule Take 1 capsule (40 mg total) by mouth daily. PT NEEDS NEW PCP FOR FURTHER REFILLS DR [name] RETIRED Patient taking differently: Take 40 mg by mouth daily.  12/09/13  Yes [name], Heinz Knuckles, MD  predniSONE (DELTASONE) 10 MG tablet Take 10-40 mg by mouth See admin instructions. Take 4 tabs daily x 4  days, then 3 tabs daily x 4 days, then 2 tabs daily x 4 days, then 1 tab daily x 4 days, and then 1 tab every other day until gone. 08/14/18  Yes [provider]  rOPINIRole (REQUIP) 4 MG tablet Take 4 mg by mouth at bedtime.    Yes [provider]  sertraline (ZOLOFT) 50 MG tablet Take 1 tablet (50 mg total) by mouth daily. Patient taking differently: Take 100 mg by mouth daily.  12/09/13  Yes Hendricks Limes, MD  simvastatin (ZOCOR) 40 MG tablet Take 1 tablet (40 mg total) by mouth at bedtime. PT NEEDS NEW  PCP FOR FURTHER REFILLS DR [name] RETIRED Patient taking differently: Take 40 mg by mouth at bedtime.  12/09/13  Yes [name], Heinz Knuckles, MD  traMADol Veatrice Bourbon) 50 MG tablet Take two tablets by mouth every 6 hours as needed for pain 09/30/13  Yes Reed, Tiffany L, DO  traZODone (DESYREL) 100 MG tablet Take 100-200 mg by mouth at bedtime.   Yes [provider]  ACCU-CHEK SOFTCLIX LANCETS lancets Use to test blood sugar 2 times daily as instructed. Dx code: 250.61 01/28/14   Renato Shin, MD  acetaminophen-codeine (TYLENOL #3) 300-30 MG per tablet TAKE 1 TABLET BY MOUTH EVERY MORNING Patient not taking: Reported on 08/24/2018 11/04/13   Neena Rhymes, MD  BD ULTRA-FINE PEN NEEDLES 29G X 12.7MM MISC USE DAILY 10/07/14   Renato Shin, MD  BD ULTRA-FINE PEN NEEDLES 29G X 12.7MM MISC USE DAILY 07/29/16   Renato Shin, MD  Blood Glucose Monitoring Suppl (ACCU-CHEK AVIVA PLUS) W/DEVICE KIT Use to check blood sugar daily. Dx code: 250.61 01/28/14   Renato Shin, MD  glucose blood (ACCU-CHEK AVIVA PLUS) test strip Use to test blood sugar 2 times daily as instructed. Dx code: 250.61 01/28/14   Renato Shin, MD  Glucose Blood (BAYER BREEZE 2 TEST) DISK TEST 2 TIMES PER DAY 01/24/14   Renato Shin, MD  HYDROmorphone (DILAUDID) 2 MG tablet Take one to two tablets by mouth every 4 hours as needed for pain Patient not taking: Reported on 08/24/2018 09/24/13   Estill Dooms, MD  Insulin  NPH, Human,, Isophane, (HUMULIN N KWIKPEN) 100 UNIT/ML Kiwkpen Inject 25 Units into the skin at bedtime. Patient not taking: Reported on 08/24/2018 01/15/14   Renato Shin, MD  methocarbamol (ROBAXIN) 500 MG tablet Take 1 tablet (500 mg total) by mouth 2 (two) times daily with a meal. Patient not taking: Reported on 08/24/2018 09/11/13   Leighton Parody, PA-C  omeprazole (PRILOSEC) 40 MG capsule TAKE ONE CAPSULE BY MOUTH EVERY DAY Patient not taking: Reported on 08/24/2018    Neena Rhymes, MD  oxyCODONE-acetaminophen (ROXICET) 5-325 MG per tablet Take 1 tablet by mouth every 4 (four) hours as needed. Patient not taking: Reported on 08/24/2018 09/11/13   Leighton Parody, PA-C  tizanidine (ZANAFLEX) 2 MG capsule Take 1 capsule (2 mg total) by mouth 3 (three) times daily as needed for muscle spasms. Patient not taking: Reported on 08/24/2018 09/12/13   Frederik Pear, MD    Physical Exam:  Constitutional: Wheezing with accessory muscle use but able to speak in full sentences obese female Vitals:   08/24/18 1415 08/24/18 1430 08/24/18 1445 08/24/18 1534  BP: (!) 146/74 128/66 135/68   Pulse: 75 77 77   Resp: '18 17 20   ' Temp:      TempSrc:      SpO2: 99% 96% 95% 94%   Eyes: PERRL, lids and conjunctivae normal ENMT: Mucous membranes are moist. Posterior pharynx clear of any exudate or lesions.Normal dentition.  Neck: normal, supple, no masses, no thyromegaly Respiratory: Wheezing bilaterally both inspiratory and expiratory with accessory muscle use.  There to percussion Cardiovascular: Regular rate and rhythm, no murmurs / rubs / gallops. No extremity edema. 2+ pedal pulses. No carotid bruits.  Abdomen: no tenderness, no masses palpated. No hepatosplenomegaly. Bowel sounds positive.  Musculoskeletal: no clubbing / cyanosis. No joint deformity upper and lower extremities. Good ROM, no contractures. Normal muscle tone.  Skin: no rashes, lesions, ulcers. No induration Neurologic: CN 2-12 grossly  intact. Sensation intact, DTR  normal. Strength 5/5 in all 4.  Psychiatric: Normal judgment and insight. Alert and oriented x 3. Normal mood.    Labs on Admission: I have personally reviewed following labs and imaging studies  CBC: Recent Labs  Lab 08/24/18 1225  WBC 15.7*  NEUTROABS 13.0*  HGB 13.7  HCT 41.5  MCV 86.8  PLT 694   Basic Metabolic Panel: Recent Labs  Lab 08/24/18 1225  NA 137  K 4.2  CL 101  CO2 25  GLUCOSE 205*  BUN 28*  CREATININE 1.19*  CALCIUM 8.8*   Urine analysis:    Component Value Date/Time   COLORURINE YELLOW 09/02/2013 1340   APPEARANCEUR CLEAR 09/02/2013 1340   LABSPEC 1.029 09/02/2013 1340   PHURINE 6.0 09/02/2013 1340   GLUCOSEU NEGATIVE 09/02/2013 1340   GLUCOSEU NEGATIVE 10/30/2009 1006   [place] 09/02/2013 1340   [place] 09/02/2013 1340   [place] 09/02/2013 1340   PROTEINUR 30 (A) 09/02/2013 1340   UROBILINOGEN 0.2 09/02/2013 1340   NITRITE NEGATIVE 09/02/2013 1340   LEUKOCYTESUR NEGATIVE 09/02/2013 1340    Radiological Exams on Admission: Dg Chest 2 View  Result Date: 08/24/2018 CLINICAL DATA:  Cough and cold symptoms for several weeks EXAM: CHEST - 2 VIEW COMPARISON:  08/13/2018 FINDINGS: Cardiac shadow is mildly enlarged. The lungs are clear bilaterally. No sizable effusion is seen. Degenerative changes of the thoracic spine are noted. IMPRESSION: No active cardiopulmonary disease. Electronically Signed   By: Inez Catalina M.D.   On: 08/24/2018 13:14   Ct Angio Chest Pe W And/or Wo Contrast  Result Date: 08/24/2018 CLINICAL DATA:  Ct chest pe, Pt reports cold symptoms, cough, generalized weakness for 5 weeks per pt. Pt seen by PCP and treated with steroids and abx without relief EXAM: CT ANGIOGRAPHY CHEST WITH CONTRAST TECHNIQUE: Multidetector CT imaging of the chest was performed using the standard protocol during bolus administration of intravenous contrast. Multiplanar CT image reconstructions and  MIPs were obtained to evaluate the vascular anatomy. CONTRAST:  159m ISOVUE-370 IOPAMIDOL (ISOVUE-370) INJECTION 76% COMPARISON:  None. FINDINGS: Cardiovascular: Heart size upper limits normal. No pericardial effusion. RV is nondilated. Mildly dilated central pulmonary arteries. Satisfactory opacification of pulmonary arteries noted, and there is no evidence of pulmonary emboli. Scattered coronary calcifications. Adequate contrast opacification of the thoracic aorta with no evidence of dissection, aneurysm, or stenosis. There is classic 3-vessel brachiocephalic arch anatomy without proximal stenosis. Scattered calcified plaque in the distal arch and descending thoracic segment. Mediastinum/Nodes: No hilar or mediastinal adenopathy. Lungs/Pleura: No pleural effusion. No pneumothorax. Mild dependent atelectasis posteriorly in both lungs. No nodule or focal infiltrate. Upper Abdomen: No acute findings. Musculoskeletal: Anterior vertebral endplate spurring at multiple levels in the mid and lower thoracic spine. No fracture or worrisome bone lesion. Review of the MIP images confirms the above findings. IMPRESSION: 1. Negative for acute PE or thoracic aortic dissection. 2. Atherosclerosis, including aortic and coronary artery disease. Please note that although the presence of coronary artery calcium documents the presence of coronary artery disease, the severity of this disease and any potential stenosis cannot be assessed on this non-gated CT examination. Assessment for potential risk factor modification, dietary therapy or pharmacologic therapy may be warranted, if clinically indicated. Electronically Signed   By: DLucrezia EuropeM.D.   On: 08/24/2018 14:47    EKG: Independently reviewed.  Sinus rhythm with early repolarization abnormalities  Assessment/Plan Principal Problem:   Chronic obstructive asthma with acute exacerbation of asthma (HCC) Active Problems:   DM (  diabetes mellitus), type 2, uncontrolled ([place])    Type 2 diabetes mellitus with hyperlipidemia (HCC)   Essential hypertension   Restless leg syndrome    1.  Chronic obstructive asthma with acute exacerbation of asthma: Dr. Lake Bells does not feel that the patient has asthma rather he feels that she has severe reflux.  He has recommended low-dose steroids and increase proton pump inhibitor.  Increase her Protonix to 80 mg twice daily.  Will start low-dose prednisone as he suggested.  2.  Diabetes mellitus type 2 uncontrolled.  Sliding scale insulin coverage has been ordered in addition to mealtime insulin given her elevated blood glucoses.  Hopefully as we taper off the steroids her sugars will normalize.  3.  Hyperlipidemia: Continue statin from home.  4.  Essential hypertension: Continue home medications monitor closely.  5.  Restless leg syndrome: Continue home medication.  DVT prophylaxis: Lovenox Code Status: Full Family Communication: Family present at the time of admission.  Patient retains capacity. Disposition Plan: Likely home in 3 to 4 days Consults called: Pulmonary, Dr. Lake Bells please see consult. Admission status: Inpatient   Lady Deutscher MD FACP [facility] Pager [phone]  If 7PM-7AM, please contact night-coverage [email or web address] Password [email or web address]  08/24/2018, 5:06 PM

## 2018-08-24 NOTE — ED Notes (Signed)
Respiratory made aware of cont neb, en route

## 2018-08-24 NOTE — ED Triage Notes (Signed)
Pt reports cold symptoms, cough, generalized weakness for 5 weeks per pt. Pt seen by PCP and treated with steroids and abx without relief

## 2018-08-25 ENCOUNTER — Other Ambulatory Visit: Payer: Self-pay

## 2018-08-25 ENCOUNTER — Other Ambulatory Visit (HOSPITAL_COMMUNITY): Payer: Medicare Other

## 2018-08-25 ENCOUNTER — Encounter (HOSPITAL_COMMUNITY): Payer: Self-pay

## 2018-08-25 ENCOUNTER — Inpatient Hospital Stay (HOSPITAL_COMMUNITY): Payer: Medicare Other

## 2018-08-25 DIAGNOSIS — R0602 Shortness of breath: Secondary | ICD-10-CM

## 2018-08-25 LAB — CBC
HCT: 36.4 % (ref 36.0–46.0)
Hemoglobin: 12 g/dL (ref 12.0–15.0)
MCH: 28.3 pg (ref 26.0–34.0)
MCHC: 33 g/dL (ref 30.0–36.0)
MCV: 85.8 fL (ref 80.0–100.0)
NRBC: 0 % (ref 0.0–0.2)
Platelets: 170 10*3/uL (ref 150–400)
RBC: 4.24 MIL/uL (ref 3.87–5.11)
RDW: 14.3 % (ref 11.5–15.5)
WBC: 16.6 10*3/uL — ABNORMAL HIGH (ref 4.0–10.5)

## 2018-08-25 LAB — BASIC METABOLIC PANEL
Anion gap: 11 (ref 5–15)
BUN: 23 mg/dL (ref 8–23)
CO2: 23 mmol/L (ref 22–32)
Calcium: 8.6 mg/dL — ABNORMAL LOW (ref 8.9–10.3)
Chloride: 102 mmol/L (ref 98–111)
Creatinine, Ser: 1.01 mg/dL — ABNORMAL HIGH (ref 0.44–1.00)
GFR calc Af Amer: >60 mL/min (ref >60)
GFR calc non Af Amer: 54 mL/min — ABNORMAL LOW (ref >60)
Glucose, Bld: 353 mg/dL — ABNORMAL HIGH (ref 70–99)
Potassium: 4.3 mmol/L (ref 3.5–5.1)
Sodium: 136 mmol/L (ref 135–145)

## 2018-08-25 LAB — GLUCOSE, CAPILLARY
Glucose-Capillary: 289 mg/dL — ABNORMAL HIGH (ref 70–99)
Glucose-Capillary: 98 mg/dL (ref 70–99)
Glucose-Capillary: 129 mg/dL — ABNORMAL HIGH (ref 70–99)
Glucose-Capillary: 243 mg/dL — ABNORMAL HIGH (ref 70–99)

## 2018-08-25 LAB — HEMOGLOBIN A1C
Hgb A1c MFr Bld: 10.1 % — ABNORMAL HIGH (ref 4.8–5.6)
Mean Plasma Glucose: 243.17 mg/dL

## 2018-08-25 LAB — ECHOCARDIOGRAM COMPLETE: WEIGHTICAEL: 3499.14 [oz_av]

## 2018-08-25 MED ORDER — FUROSEMIDE 10 MG/ML IJ SOLN
40.0000 mg | Freq: Two times a day (BID) | INTRAMUSCULAR | Status: DC
  Administered 2018-08-25 – 2018-08-26 (×3): 40 mg via INTRAVENOUS
  Filled 2018-08-25 (×3): qty 4

## 2018-08-25 MED ORDER — INSULIN GLARGINE 100 UNIT/ML ~~LOC~~ SOLN
65.0000 [IU] | Freq: Every day | SUBCUTANEOUS | Status: DC
  Administered 2018-08-25: 65 [IU] via SUBCUTANEOUS
  Filled 2018-08-25: qty 0.65

## 2018-08-25 MED ORDER — INSULIN ASPART 100 UNIT/ML ~~LOC~~ SOLN
8.0000 [IU] | Freq: Three times a day (TID) | SUBCUTANEOUS | Status: DC
  Administered 2018-08-25 (×2): 8 [IU] via SUBCUTANEOUS

## 2018-08-25 MED ORDER — INSULIN ASPART PROT & ASPART (70-30 MIX) 100 UNIT/ML ~~LOC~~ SUSP
65.0000 [IU] | Freq: Two times a day (BID) | SUBCUTANEOUS | Status: DC
  Filled 2018-08-25: qty 10

## 2018-08-25 NOTE — Progress Notes (Signed)
  Echocardiogram 2D Echocardiogram has been performed.  Joelene Millin 08/25/2018, 5:16 PM

## 2018-08-25 NOTE — Progress Notes (Signed)
PROGRESS NOTE    Carrie White  OFB:[id number] DOB: 1941-03-25 DOA: 08/24/2018 PCP: Rodena Medin, MD (Inactive)  Brief Narrative: This is a 78 year old female with prior history of asthma, recurrent bronchitis, never had PFTs presented to the emergency room last night with 5 weeks of cough wheezing and shortness of breath. -History of tobacco use, reports that her asthma symptoms only started until about 2 years ago has never seen a pulmonologist or had PFTs, she was wheezing in the emergency room but this was felt to be an upper airway wheeze -CT angiogram was negative for PE, some coronary atherosclerosis noted -Is also noted to have some chronic lower extremity edema and reportedly takes diuretics at baseline, no history of CHF, never seen a cardiologist  Assessment & Plan:   Reactive airways disease/recurrent bronchitis  -Symptomatic with cough/congestion/wheezing for many weeks -Suspected to be less likely asthma, more upper airway wheeze possibly from GERD or VCD -In addition mild component of fluid overload likely contributing as well, follow-up echo -Continue low-dose of prednisone, nebulizations, PPI -Needs close follow-up with pulmonary and PFTs -Could benefit from weight loss and sleep study  Lower extremity edema -Likely from diastolic CHF versus chronic venous stasis -Takes Lasix twice daily at home will give 2 doses of IV Lasix today, stop IV fluids -Follow-up 2D echocardiogram  GERD -Protonix daily  Diabetes mellitus/uncontrolled -Stop 70/30 insulin, start Lantus, add pre-meal NovoLog and sliding scale insulin -Uncontrolled at baseline worsened in the setting of current steroids  Essential hypertension Continue home regimen  Dyslipidemia Continue statin  DVT prophylaxis: Lovenox Code Status: Full code Family Communication: No family at bedside Disposition Plan: Home tomorrow pending work-up  Consultants:   Pulm   Procedures:   Antimicrobials:     Subjective: -Improving still coughing and wheezing quite a bit  Objective: Vitals:   08/24/18 1907 08/24/18 2310 08/25/18 0500 08/25/18 0754  BP: (!) 149/61 (!) 123/57  127/84  Pulse: (!) 108 91  77  Resp:  18  18  Temp: 97.9 F (36.6 C) 97.8 F (36.6 C)  97.8 F (36.6 C)  TempSrc: Oral Oral  Oral  SpO2: 98% 98%  96%  Weight:   99.2 kg     Intake/Output Summary (Last 24 hours) at 08/25/2018 1133 Last data filed at 08/25/2018 1000 Gross per 24 hour  Intake 1306.84 ml  Output -  Net 1306.84 ml   Filed Weights   08/25/18 0500  Weight: 99.2 kg    Examination:  General exam: Obese pleasant female, sitting up in bed, AAO x3, no distress  Respiratory system: Poor air movement bilaterally, rare upper airway wheeze noted Cardiovascular system: S1 & S2 heard, RRR.  Gastrointestinal system: Abdomen is nondistended, soft and nontender.Normal bowel sounds heard. Central nervous system: Alert and oriented. No focal neurological deficits. Extremities: 1+ edema Skin: No rashes, lesions or ulcers Psychiatry: Judgement and insight appear normal. Mood & affect appropriate.     Data Reviewed:   CBC: Recent Labs  Lab 08/24/18 1225 08/25/18 0212  WBC 15.7* 16.6*  NEUTROABS 13.0*  --   HGB 13.7 12.0  HCT 41.5 36.4  MCV 86.8 85.8  PLT 189 782   Basic Metabolic Panel: Recent Labs  Lab 08/24/18 1225 08/25/18 0212  NA 137 136  K 4.2 4.3  CL 101 102  CO2 25 23  GLUCOSE 205* 353*  BUN 28* 23  CREATININE 1.19* 1.01*  CALCIUM 8.8* 8.6*   GFR: CrCl cannot be calculated (Unknown ideal weight.). Liver Function  Tests: No results for input(s): AST, ALT, ALKPHOS, BILITOT, PROT, ALBUMIN in the last 168 hours. No results for input(s): LIPASE, AMYLASE in the last 168 hours. No results for input(s): AMMONIA in the last 168 hours. Coagulation Profile: No results for input(s): INR, PROTIME in the last 168 hours. Cardiac Enzymes: No results for input(s): CKTOTAL, CKMB,  CKMBINDEX, TROPONINI in the last 168 hours. BNP (last 3 results) No results for input(s): PROBNP in the last 8760 hours. HbA1C: Recent Labs    08/25/18 0212  HGBA1C 10.1*   CBG: Recent Labs  Lab 08/24/18 2104 08/25/18 0757  GLUCAP 484* 289*   Lipid Profile: No results for input(s): CHOL, HDL, LDLCALC, TRIG, CHOLHDL, LDLDIRECT in the last 72 hours. Thyroid Function Tests: Recent Labs    08/24/18 1931  TSH 0.210*   Anemia Panel: No results for input(s): VITAMINB12, FOLATE, FERRITIN, TIBC, IRON, RETICCTPCT in the last 72 hours. Urine analysis:    Component Value Date/Time   COLORURINE YELLOW 09/02/2013 1340   APPEARANCEUR CLEAR 09/02/2013 1340   LABSPEC 1.029 09/02/2013 1340   PHURINE 6.0 09/02/2013 1340   GLUCOSEU NEGATIVE 09/02/2013 1340   GLUCOSEU NEGATIVE 10/30/2009 1006   HGBUR NEGATIVE 09/02/2013 1340   BILIRUBINUR NEGATIVE 09/02/2013 1340   KETONESUR NEGATIVE 09/02/2013 1340   PROTEINUR 30 (A) 09/02/2013 1340   UROBILINOGEN 0.2 09/02/2013 1340   NITRITE NEGATIVE 09/02/2013 1340   LEUKOCYTESUR NEGATIVE 09/02/2013 1340   Sepsis Labs: @LABRCNTIP (procalcitonin:4,lacticidven:4)  ) Recent Results (from the past 240 hour(s))  Respiratory Panel by PCR     Status: None   Collection Time: 08/24/18  7:30 PM  Result Value Ref Range Status   Adenovirus NOT DETECTED NOT DETECTED Final   Coronavirus 229E NOT DETECTED NOT DETECTED Final   Coronavirus HKU1 NOT DETECTED NOT DETECTED Final   Coronavirus NL63 NOT DETECTED NOT DETECTED Final   Coronavirus OC43 NOT DETECTED NOT DETECTED Final   Metapneumovirus NOT DETECTED NOT DETECTED Final   Rhinovirus / Enterovirus NOT DETECTED NOT DETECTED Final   Influenza A NOT DETECTED NOT DETECTED Final   Influenza B NOT DETECTED NOT DETECTED Final   Parainfluenza Virus 1 NOT DETECTED NOT DETECTED Final   Parainfluenza Virus 2 NOT DETECTED NOT DETECTED Final   Parainfluenza Virus 3 NOT DETECTED NOT DETECTED Final   Parainfluenza  Virus 4 NOT DETECTED NOT DETECTED Final   Respiratory Syncytial Virus NOT DETECTED NOT DETECTED Final   Bordetella pertussis NOT DETECTED NOT DETECTED Final   Chlamydophila pneumoniae NOT DETECTED NOT DETECTED Final   Mycoplasma pneumoniae NOT DETECTED NOT DETECTED Final         Radiology Studies: Dg Chest 2 View  Result Date: 08/24/2018 CLINICAL DATA:  Cough and cold symptoms for several weeks EXAM: CHEST - 2 VIEW COMPARISON:  08/13/2018 FINDINGS: Cardiac shadow is mildly enlarged. The lungs are clear bilaterally. No sizable effusion is seen. Degenerative changes of the thoracic spine are noted. IMPRESSION: No active cardiopulmonary disease. Electronically Signed   By: Inez Catalina M.D.   On: 08/24/2018 13:14   Ct Angio Chest Pe W And/or Wo Contrast  Result Date: 08/24/2018 CLINICAL DATA:  Ct chest pe, Pt reports cold symptoms, cough, generalized weakness for 5 weeks per pt. Pt seen by PCP and treated with steroids and abx without relief EXAM: CT ANGIOGRAPHY CHEST WITH CONTRAST TECHNIQUE: Multidetector CT imaging of the chest was performed using the standard protocol during bolus administration of intravenous contrast. Multiplanar CT image reconstructions and MIPs were obtained to evaluate the  vascular anatomy. CONTRAST:  132mL ISOVUE-370 IOPAMIDOL (ISOVUE-370) INJECTION 76% COMPARISON:  None. FINDINGS: Cardiovascular: Heart size upper limits normal. No pericardial effusion. RV is nondilated. Mildly dilated central pulmonary arteries. Satisfactory opacification of pulmonary arteries noted, and there is no evidence of pulmonary emboli. Scattered coronary calcifications. Adequate contrast opacification of the thoracic aorta with no evidence of dissection, aneurysm, or stenosis. There is classic 3-vessel brachiocephalic arch anatomy without proximal stenosis. Scattered calcified plaque in the distal arch and descending thoracic segment. Mediastinum/Nodes: No hilar or mediastinal adenopathy.  Lungs/Pleura: No pleural effusion. No pneumothorax. Mild dependent atelectasis posteriorly in both lungs. No nodule or focal infiltrate. Upper Abdomen: No acute findings. Musculoskeletal: Anterior vertebral endplate spurring at multiple levels in the mid and lower thoracic spine. No fracture or worrisome bone lesion. Review of the MIP images confirms the above findings. IMPRESSION: 1. Negative for acute PE or thoracic aortic dissection. 2. Atherosclerosis, including aortic and coronary artery disease. Please note that although the presence of coronary artery calcium documents the presence of coronary artery disease, the severity of this disease and any potential stenosis cannot be assessed on this non-gated CT examination. Assessment for potential risk factor modification, dietary therapy or pharmacologic therapy may be warranted, if clinically indicated. Electronically Signed   By: Lucrezia Europe M.D.   On: 08/24/2018 14:47        Scheduled Meds: . aspirin EC  81 mg Oral Daily  . enoxaparin (LOVENOX) injection  40 mg Subcutaneous Q24H  . ferrous sulfate  325 mg Oral Q breakfast  . furosemide  40 mg Intravenous Q12H  . gabapentin  300 mg Oral BID  . glimepiride  4 mg Oral BID  . insulin aspart  0-20 Units Subcutaneous TID WC  . insulin aspart  0-5 Units Subcutaneous QHS  . insulin aspart  8 Units Subcutaneous TID WC  . insulin glargine  65 Units Subcutaneous QHS  . lisinopril  10 mg Oral Daily  . meloxicam  7.5 mg Oral Daily  . mometasone-formoterol  2 puff Inhalation BID  . montelukast  10 mg Oral QHS  . pantoprazole  80 mg Oral BID  . predniSONE  20 mg Oral Q breakfast  . rOPINIRole  4 mg Oral QHS  . sertraline  100 mg Oral Daily  . simvastatin  40 mg Oral QHS  . sodium chloride flush  3 mL Intravenous Q12H  . sodium chloride flush  3 mL Intravenous Q12H  . traZODone  100 mg Oral QHS   Continuous Infusions: . sodium chloride    . albuterol Stopped (08/24/18 1533)     LOS: 1 day     Time spent: 50min    Domenic Polite, MD [facility] Page via [email or web address], password [email or web address] After 7PM please contact night-coverage  08/25/2018, 11:33 AM

## 2018-08-26 LAB — BASIC METABOLIC PANEL
Anion gap: 12 (ref 5–15)
BUN: 32 mg/dL — ABNORMAL HIGH (ref 8–23)
CALCIUM: 9.2 mg/dL (ref 8.9–10.3)
CO2: 32 mmol/L (ref 22–32)
Chloride: 94 mmol/L — ABNORMAL LOW (ref 98–111)
Creatinine, Ser: 1.25 mg/dL — ABNORMAL HIGH (ref 0.44–1.00)
GFR calc Af Amer: 48 mL/min — ABNORMAL LOW (ref >60)
GFR calc non Af Amer: 42 mL/min — ABNORMAL LOW (ref >60)
Glucose, Bld: 61 mg/dL — ABNORMAL LOW (ref 70–99)
Potassium: 2.9 mmol/L — ABNORMAL LOW (ref 3.5–5.1)
Sodium: 138 mmol/L (ref 135–145)

## 2018-08-26 LAB — CBC
HCT: 41.4 % (ref 36.0–46.0)
Hemoglobin: 13.4 g/dL (ref 12.0–15.0)
MCH: 28.1 pg (ref 26.0–34.0)
MCHC: 32.4 g/dL (ref 30.0–36.0)
MCV: 86.8 fL (ref 80.0–100.0)
Platelets: 171 10*3/uL (ref 150–400)
RBC: 4.77 MIL/uL (ref 3.87–5.11)
RDW: 14.5 % (ref 11.5–15.5)
WBC: 19.1 10*3/uL — ABNORMAL HIGH (ref 4.0–10.5)
nRBC: 0 % (ref 0.0–0.2)

## 2018-08-26 LAB — GLUCOSE, CAPILLARY
Glucose-Capillary: 55 mg/dL — ABNORMAL LOW (ref 70–99)
Glucose-Capillary: 108 mg/dL — ABNORMAL HIGH (ref 70–99)
Glucose-Capillary: 275 mg/dL — ABNORMAL HIGH (ref 70–99)
Glucose-Capillary: 499 mg/dL — ABNORMAL HIGH (ref 70–99)
Glucose-Capillary: 401 mg/dL — ABNORMAL HIGH (ref 70–99)

## 2018-08-26 MED ORDER — FUROSEMIDE 40 MG PO TABS
40.0000 mg | ORAL_TABLET | Freq: Two times a day (BID) | ORAL | Status: DC
  Administered 2018-08-26 – 2018-08-27 (×2): 40 mg via ORAL
  Filled 2018-08-26 (×2): qty 1

## 2018-08-26 MED ORDER — INSULIN GLARGINE 100 UNIT/ML ~~LOC~~ SOLN
65.0000 [IU] | Freq: Every day | SUBCUTANEOUS | Status: DC
  Administered 2018-08-26 – 2018-08-27 (×2): 65 [IU] via SUBCUTANEOUS
  Filled 2018-08-26 (×2): qty 0.65

## 2018-08-26 MED ORDER — BENZONATATE 100 MG PO CAPS
100.0000 mg | ORAL_CAPSULE | Freq: Three times a day (TID) | ORAL | Status: DC
  Administered 2018-08-26 – 2018-08-30 (×13): 100 mg via ORAL
  Filled 2018-08-26 (×13): qty 1

## 2018-08-26 MED ORDER — POTASSIUM CHLORIDE CRYS ER 20 MEQ PO TBCR
40.0000 meq | EXTENDED_RELEASE_TABLET | Freq: Three times a day (TID) | ORAL | Status: AC
  Administered 2018-08-26 – 2018-08-27 (×3): 40 meq via ORAL
  Filled 2018-08-26 (×3): qty 2

## 2018-08-26 MED ORDER — POTASSIUM CHLORIDE CRYS ER 20 MEQ PO TBCR
40.0000 meq | EXTENDED_RELEASE_TABLET | Freq: Three times a day (TID) | ORAL | Status: DC
  Administered 2018-08-26: 40 meq via ORAL
  Filled 2018-08-26: qty 2

## 2018-08-26 MED ORDER — PREDNISONE 20 MG PO TABS
20.0000 mg | ORAL_TABLET | Freq: Once | ORAL | Status: AC
  Administered 2018-08-26: 20 mg via ORAL
  Filled 2018-08-26: qty 1

## 2018-08-26 MED ORDER — INSULIN GLARGINE 100 UNIT/ML ~~LOC~~ SOLN
55.0000 [IU] | Freq: Every day | SUBCUTANEOUS | Status: DC
  Filled 2018-08-26: qty 0.55

## 2018-08-26 MED ORDER — PREDNISONE 20 MG PO TABS
40.0000 mg | ORAL_TABLET | Freq: Every day | ORAL | Status: DC
  Administered 2018-08-27: 40 mg via ORAL
  Filled 2018-08-26: qty 2

## 2018-08-26 MED ORDER — IPRATROPIUM-ALBUTEROL 0.5-2.5 (3) MG/3ML IN SOLN
3.0000 mL | Freq: Four times a day (QID) | RESPIRATORY_TRACT | Status: DC
  Administered 2018-08-26 – 2018-08-30 (×16): 3 mL via RESPIRATORY_TRACT
  Filled 2018-08-26 (×13): qty 3

## 2018-08-26 MED ORDER — INSULIN ASPART 100 UNIT/ML ~~LOC~~ SOLN
6.0000 [IU] | Freq: Three times a day (TID) | SUBCUTANEOUS | Status: DC
  Administered 2018-08-26 – 2018-08-27 (×3): 6 [IU] via SUBCUTANEOUS

## 2018-08-26 NOTE — Progress Notes (Signed)
PROGRESS NOTE    Carrie White  UVO:[id number] DOB: [birth year] DOA: 08/24/2018 PCP: Rodena Medin, MD (Inactive)  Brief Narrative: This is a 78 year old female with prior history of asthma, recurrent bronchitis, never had PFTs presented to the emergency room last night with 5 weeks of cough wheezing and shortness of breath. -History of tobacco use, reports that her asthma symptoms only started until about 2 years ago has never seen a pulmonologist or had PFTs, she was wheezing in the emergency room but this was felt to be an upper airway wheeze -CT angiogram was negative for PE, some coronary atherosclerosis noted -Is also noted to have some chronic lower extremity edema and reportedly takes diuretics at baseline, no history of CHF, never seen a cardiologist  Assessment & Plan:   Acute hypoxic respiratory failure  -Bronchitis, reactive airway disease, upper airway wheeze   -Symptomatic with cough/congestion/wheezing for 4-5 weeks -Appreciate pulmonary consult, suspected to be less likely asthma, more upper airway wheeze possibly from GERD or VCD -Echo with preserved EF, diastolic dysfunction -Improving, continues to be significantly symptomatic -Continue prednisone, dose increased, continue duo nebs -Needs close follow-up with pulmonary and PFTs -Could benefit from weight loss and sleep study  Acute on chronic diastolic CHF  -Improving with diuresis  -IV Lasix today, transition to oral diuretics soon  -Echocardiogram with preserved EF and diastolic dysfunction  GERD -Protonix daily  Diabetes mellitus/uncontrolled -Held 70/30 insulin, she was started on Lantus, pre-meal NovoLog and sliding scale yesterday due to severe hyperglycemia, some hypoglycemic episodes this morning will cut down Lantus and pre-meal NovoLog dose -Uncontrolled at baseline worsened in the setting of current steroids  Essential hypertension Continue home regimen  Dyslipidemia Continue statin  DVT  prophylaxis: Lovenox Code Status: Full code Family Communication: No family at bedside Disposition Plan: Home pending improvement in symptoms  Consultants:   Pulm   Procedures:   Antimicrobials:    Subjective: -Feels a little better, not close to baseline yet, still coughing congested and wheezing  Objective: Vitals:   08/25/18 2259 08/26/18 0416 08/26/18 0630 08/26/18 0826  BP: 110/60   118/60  Pulse: 81   70  Resp: 18   10  Temp: 97.7 F (36.5 C)   (!) 97.5 F (36.4 C)  TempSrc: Oral   Oral  SpO2: 98%   98%  Weight:  96.3 kg    Height:   5\' 2"  (1.575 m)     Intake/Output Summary (Last 24 hours) at 08/26/2018 1201 Last data filed at 08/26/2018 1100 Gross per 24 hour  Intake 3 ml  Output -  Net 3 ml   Filed Weights   08/25/18 0500 08/26/18 0416  Weight: 99.2 kg 96.3 kg    Examination: Gen: Awake, Alert, Oriented X 3, uncomfortable appearing HEENT: PERRLA, Neck supple, no JVD Lungs: Poor air movement, scattered rhonchi and wheezes CVS: RRR,No Gallops,Rubs or new Murmurs Abd: soft, Non tender, non distended, BS present Extremities: No edema Skin: no new rashes Psychiatry: Judgement and insight appear normal. Mood & affect appropriate.     Data Reviewed:   CBC: Recent Labs  Lab 08/24/18 1225 08/25/18 0212 08/26/18 0344  WBC 15.7* 16.6* 19.1*  NEUTROABS 13.0*  --   --   HGB 13.7 12.0 13.4  HCT 41.5 36.4 41.4  MCV 86.8 85.8 86.8  PLT 189 170 742   Basic Metabolic Panel: Recent Labs  Lab 08/24/18 1225 08/25/18 0212 08/26/18 0344  NA 137 136 138  K 4.2 4.3 2.9*  CL  101 102 94*  CO2 25 23 32  GLUCOSE 205* 353* 61*  BUN 28* 23 32*  CREATININE 1.19* 1.01* 1.25*  CALCIUM 8.8* 8.6* 9.2   GFR: Estimated Creatinine Clearance: 41.5 mL/min (A) (by C-G formula based on SCr of 1.25 mg/dL (H)). Liver Function Tests: No results for input(s): AST, ALT, ALKPHOS, BILITOT, PROT, ALBUMIN in the last 168 hours. No results for input(s): LIPASE, AMYLASE in  the last 168 hours. No results for input(s): AMMONIA in the last 168 hours. Coagulation Profile: No results for input(s): INR, PROTIME in the last 168 hours. Cardiac Enzymes: No results for input(s): CKTOTAL, CKMB, CKMBINDEX, TROPONINI in the last 168 hours. BNP (last 3 results) No results for input(s): PROBNP in the last 8760 hours. HbA1C: Recent Labs    08/25/18 0212  HGBA1C 10.1*   CBG: Recent Labs  Lab 08/25/18 1212 08/25/18 1718 08/25/18 2109 08/26/18 0824 08/26/18 0905  GLUCAP 98 129* 243* 55* 108*   Lipid Profile: No results for input(s): CHOL, HDL, LDLCALC, TRIG, CHOLHDL, LDLDIRECT in the last 72 hours. Thyroid Function Tests: Recent Labs    08/24/18 1931  TSH 0.210*   Anemia Panel: No results for input(s): VITAMINB12, FOLATE, FERRITIN, TIBC, IRON, RETICCTPCT in the last 72 hours. Urine analysis:    Component Value Date/Time   COLORURINE YELLOW 09/02/2013 1340   APPEARANCEUR CLEAR 09/02/2013 1340   LABSPEC 1.029 09/02/2013 1340   PHURINE 6.0 09/02/2013 1340   GLUCOSEU NEGATIVE 09/02/2013 1340   GLUCOSEU NEGATIVE 10/30/2009 1006   HGBUR NEGATIVE 09/02/2013 1340   BILIRUBINUR NEGATIVE 09/02/2013 1340   KETONESUR NEGATIVE 09/02/2013 1340   PROTEINUR 30 (A) 09/02/2013 1340   UROBILINOGEN 0.2 09/02/2013 1340   NITRITE NEGATIVE 09/02/2013 1340   LEUKOCYTESUR NEGATIVE 09/02/2013 1340   Sepsis Labs: @LABRCNTIP (procalcitonin:4,lacticidven:4)  ) Recent Results (from the past 240 hour(s))  Respiratory Panel by PCR     Status: None   Collection Time: 08/24/18  7:30 PM  Result Value Ref Range Status   Adenovirus NOT DETECTED NOT DETECTED Final   Coronavirus 229E NOT DETECTED NOT DETECTED Final   Coronavirus HKU1 NOT DETECTED NOT DETECTED Final   Coronavirus NL63 NOT DETECTED NOT DETECTED Final   Coronavirus OC43 NOT DETECTED NOT DETECTED Final   Metapneumovirus NOT DETECTED NOT DETECTED Final   Rhinovirus / Enterovirus NOT DETECTED NOT DETECTED Final    Influenza A NOT DETECTED NOT DETECTED Final   Influenza B NOT DETECTED NOT DETECTED Final   Parainfluenza Virus 1 NOT DETECTED NOT DETECTED Final   Parainfluenza Virus 2 NOT DETECTED NOT DETECTED Final   Parainfluenza Virus 3 NOT DETECTED NOT DETECTED Final   Parainfluenza Virus 4 NOT DETECTED NOT DETECTED Final   Respiratory Syncytial Virus NOT DETECTED NOT DETECTED Final   Bordetella pertussis NOT DETECTED NOT DETECTED Final   Chlamydophila pneumoniae NOT DETECTED NOT DETECTED Final   Mycoplasma pneumoniae NOT DETECTED NOT DETECTED Final         Radiology Studies: Dg Chest 2 View  Result Date: 08/24/2018 CLINICAL DATA:  Cough and cold symptoms for several weeks EXAM: CHEST - 2 VIEW COMPARISON:  08/13/2018 FINDINGS: Cardiac shadow is mildly enlarged. The lungs are clear bilaterally. No sizable effusion is seen. Degenerative changes of the thoracic spine are noted. IMPRESSION: No active cardiopulmonary disease. Electronically Signed   By: Inez Catalina M.D.   On: 08/24/2018 13:14   Ct Angio Chest Pe W And/or Wo Contrast  Result Date: 08/24/2018 CLINICAL DATA:  Ct chest pe, Pt reports  cold symptoms, cough, generalized weakness for 5 weeks per pt. Pt seen by PCP and treated with steroids and abx without relief EXAM: CT ANGIOGRAPHY CHEST WITH CONTRAST TECHNIQUE: Multidetector CT imaging of the chest was performed using the standard protocol during bolus administration of intravenous contrast. Multiplanar CT image reconstructions and MIPs were obtained to evaluate the vascular anatomy. CONTRAST:  112mL ISOVUE-370 IOPAMIDOL (ISOVUE-370) INJECTION 76% COMPARISON:  None. FINDINGS: Cardiovascular: Heart size upper limits normal. No pericardial effusion. RV is nondilated. Mildly dilated central pulmonary arteries. Satisfactory opacification of pulmonary arteries noted, and there is no evidence of pulmonary emboli. Scattered coronary calcifications. Adequate contrast opacification of the thoracic aorta  with no evidence of dissection, aneurysm, or stenosis. There is classic 3-vessel brachiocephalic arch anatomy without proximal stenosis. Scattered calcified plaque in the distal arch and descending thoracic segment. Mediastinum/Nodes: No hilar or mediastinal adenopathy. Lungs/Pleura: No pleural effusion. No pneumothorax. Mild dependent atelectasis posteriorly in both lungs. No nodule or focal infiltrate. Upper Abdomen: No acute findings. Musculoskeletal: Anterior vertebral endplate spurring at multiple levels in the mid and lower thoracic spine. No fracture or worrisome bone lesion. Review of the MIP images confirms the above findings. IMPRESSION: 1. Negative for acute PE or thoracic aortic dissection. 2. Atherosclerosis, including aortic and coronary artery disease. Please note that although the presence of coronary artery calcium documents the presence of coronary artery disease, the severity of this disease and any potential stenosis cannot be assessed on this non-gated CT examination. Assessment for potential risk factor modification, dietary therapy or pharmacologic therapy may be warranted, if clinically indicated. Electronically Signed   By: Lucrezia Europe M.D.   On: 08/24/2018 14:47        Scheduled Meds: . aspirin EC  81 mg Oral Daily  . benzonatate  100 mg Oral TID  . enoxaparin (LOVENOX) injection  40 mg Subcutaneous Q24H  . ferrous sulfate  325 mg Oral Q breakfast  . furosemide  40 mg Oral BID  . gabapentin  300 mg Oral BID  . insulin aspart  0-20 Units Subcutaneous TID WC  . insulin aspart  0-5 Units Subcutaneous QHS  . insulin aspart  6 Units Subcutaneous TID WC  . insulin glargine  55 Units Subcutaneous QHS  . ipratropium-albuterol  3 mL Nebulization Q6H  . lisinopril  10 mg Oral Daily  . mometasone-formoterol  2 puff Inhalation BID  . montelukast  10 mg Oral QHS  . pantoprazole  80 mg Oral BID  . potassium chloride  40 mEq Oral TID  . [START ON 08/27/2018] predniSONE  40 mg Oral Q  breakfast  . rOPINIRole  4 mg Oral QHS  . sertraline  100 mg Oral Daily  . simvastatin  40 mg Oral QHS  . sodium chloride flush  3 mL Intravenous Q12H  . sodium chloride flush  3 mL Intravenous Q12H  . traZODone  100 mg Oral QHS   Continuous Infusions: . sodium chloride       LOS: 2 days    Time spent: 24min    Domenic Polite, MD [facility] Page via [email or web address], password [email or web address] After 7PM please contact night-coverage  08/26/2018, 12:01 PM

## 2018-08-27 DIAGNOSIS — J441 Chronic obstructive pulmonary disease with (acute) exacerbation: Principal | ICD-10-CM

## 2018-08-27 LAB — CBC
HCT: 42.9 % (ref 36.0–46.0)
Hemoglobin: 13.9 g/dL (ref 12.0–15.0)
MCH: 28.1 pg (ref 26.0–34.0)
MCHC: 32.4 g/dL (ref 30.0–36.0)
MCV: 86.7 fL (ref 80.0–100.0)
Platelets: 194 10*3/uL (ref 150–400)
RBC: 4.95 MIL/uL (ref 3.87–5.11)
RDW: 14.3 % (ref 11.5–15.5)
WBC: 18.2 10*3/uL — ABNORMAL HIGH (ref 4.0–10.5)
nRBC: 0 % (ref 0.0–0.2)

## 2018-08-27 LAB — GLUCOSE, CAPILLARY
GLUCOSE-CAPILLARY: 195 mg/dL — AB (ref 70–99)
Glucose-Capillary: 205 mg/dL — ABNORMAL HIGH (ref 70–99)
Glucose-Capillary: 383 mg/dL — ABNORMAL HIGH (ref 70–99)
Glucose-Capillary: 377 mg/dL — ABNORMAL HIGH (ref 70–99)

## 2018-08-27 LAB — BASIC METABOLIC PANEL
Anion gap: 14 (ref 5–15)
BUN: 41 mg/dL — ABNORMAL HIGH (ref 8–23)
CO2: 27 mmol/L (ref 22–32)
CREATININE: 1.44 mg/dL — AB (ref 0.44–1.00)
Calcium: 9.2 mg/dL (ref 8.9–10.3)
Chloride: 92 mmol/L — ABNORMAL LOW (ref 98–111)
GFR calc non Af Amer: 35 mL/min — ABNORMAL LOW (ref >60)
GFR, EST AFRICAN AMERICAN: 41 mL/min — AB (ref >60)
Glucose, Bld: 233 mg/dL — ABNORMAL HIGH (ref 70–99)
Potassium: 3.7 mmol/L (ref 3.5–5.1)
Sodium: 133 mmol/L — ABNORMAL LOW (ref 135–145)

## 2018-08-27 MED ORDER — PREDNISONE 20 MG PO TABS
20.0000 mg | ORAL_TABLET | Freq: Every day | ORAL | Status: DC
  Administered 2018-08-28 – 2018-08-30 (×3): 20 mg via ORAL
  Filled 2018-08-27 (×3): qty 1

## 2018-08-27 MED ORDER — INSULIN ASPART 100 UNIT/ML ~~LOC~~ SOLN
8.0000 [IU] | Freq: Three times a day (TID) | SUBCUTANEOUS | Status: DC
  Administered 2018-08-27 – 2018-08-29 (×8): 8 [IU] via SUBCUTANEOUS

## 2018-08-27 MED ORDER — FUROSEMIDE 40 MG PO TABS
40.0000 mg | ORAL_TABLET | Freq: Every day | ORAL | Status: DC
  Administered 2018-08-28 – 2018-08-30 (×3): 40 mg via ORAL
  Filled 2018-08-27 (×3): qty 1

## 2018-08-27 MED ORDER — POTASSIUM CHLORIDE CRYS ER 20 MEQ PO TBCR
40.0000 meq | EXTENDED_RELEASE_TABLET | Freq: Every day | ORAL | Status: DC
  Administered 2018-08-28 – 2018-08-30 (×3): 40 meq via ORAL
  Filled 2018-08-27 (×3): qty 2

## 2018-08-27 MED ORDER — HYDROCOD POLST-CPM POLST ER 10-8 MG/5ML PO SUER
5.0000 mL | Freq: Two times a day (BID) | ORAL | Status: DC
  Administered 2018-08-27 – 2018-08-30 (×7): 5 mL via ORAL
  Filled 2018-08-27 (×7): qty 5

## 2018-08-27 NOTE — Progress Notes (Signed)
Inpatient Diabetes Program Recommendations  AACE/ADA: New Consensus Statement on Inpatient Glycemic Control (2015)  Target Ranges:  Prepandial:   less than 140 mg/dL      Peak postprandial:   less than 180 mg/dL (1-2 hours)      Critically ill patients:  140 - 180 mg/dL   Lab Results  Component Value Date   GLUCAP 205 (H) 08/27/2018   HGBA1C 10.1 (H) 08/25/2018    Review of Glycemic Control Results for JAKYLA, REZA (MRN [id number]) as of 08/27/2018 12:00  Ref. Range 08/26/2018 12:14 08/26/2018 16:53 08/26/2018 20:05 08/27/2018 07:24  Glucose-Capillary Latest Ref Range: 70 - 99 mg/dL 275 (H) 499 (H) 401 (H) 205 (H)  Results for GLADYS, DECKARD (MRN [id number]) as of 08/27/2018 15:08  Ref. Range 08/27/2018 07:24 08/27/2018 12:14  Glucose-Capillary Latest Ref Range: 70 - 99 mg/dL 205 (H) 195 (H)   Diabetes history: DM 2 Outpatient Diabetes medications:  Trulicity .75 mg weekly, Amaryl- pt. Not taking per endocrinology note, Humalog 75/25 50 units bid,  Current orders for Inpatient glycemic control:  Novolog resistant tid with meals and HS, Novolog 8 units tid with meals, Lantus 65 units q HS  Inpatient Diabetes Program Recommendations:    Agree with current orders.  Per chart review, patient saw Dr. Tamala Julian 07/20/19 and A1C was 7.8%.  Elevated A1C likely related to recent steroid use with bronchitis.  Will follow.  Thanks  Adah Perl, RN, BC-ADM Inpatient Diabetes Coordinator Pager [phone] ([phone])

## 2018-08-27 NOTE — Progress Notes (Signed)
PROGRESS NOTE    Carrie White  SJG:[id number] DOB: 01-Feb-1941 DOA: 08/24/2018 PCP: Rodena Medin, MD (Inactive)  Brief Narrative: This is a 78 year old female with prior history of asthma, recurrent bronchitis, never had PFTs presented to the emergency room last night with 5 weeks of cough wheezing and shortness of breath. -History of tobacco use, reports that her asthma symptoms only started until about 2 years ago has never seen a pulmonologist or had PFTs, she was wheezing in the emergency room but this was felt to be an upper airway wheeze -CT angiogram was negative for PE, some coronary atherosclerosis noted -was also noted to have some chronic lower extremity edema and reportedly takes diuretics at baseline, no history of CHF  Assessment & Plan:   Acute hypoxic respiratory failure  -Bronchitis, reactive airway disease, upper airway wheeze   -Symptomatic with cough/congestion/wheezing for 5 weeks prior to admission, Non smoker -Appreciate pulmonary consult, suspected to be less likely asthma, more upper airway wheeze possibly from GERD or VCD -Echo with preserved EF, diastolic dysfunction -Improving however continues to be significantly symptomatic, not back to baseline -Continue prednisone, dose decreased, continue duo nebs, increased PPI -cut down lasix dose to Daily, mild worsening creatinine noted -Wean O2 -Needs close follow-up with pulmonary and PFTs -Could benefit from weight loss and sleep study  Acute on chronic diastolic CHF  -Improving with diuresis  -appears close to Euvolemic, change to Lasix 40mg  daily  -Echocardiogram with preserved EF and diastolic dysfunction  GERD -Protonix daily, dose increased  Diabetes mellitus/uncontrolled -Held 70/30 insulin, she was started on Lantus, pre-meal NovoLog and sliding scale due to worsening hyperglycemia in the setting of steroids, -Increase Lantus and pre-meal NovoLog  Essential hypertension Continue home  regimen  Dyslipidemia Continue statin  DVT prophylaxis: Lovenox Code Status: Full code Family Communication: No family at bedside Disposition Plan: Home likely in 1-2days, pending improvement in symptoms  Consultants:   Pulm   Procedures:   Antimicrobials:    Subjective: -Little better but continues to have cough congestion wheezing and dyspnea with any activity   Objective: Vitals:   08/27/18 0500 08/27/18 0725 08/27/18 0752 08/27/18 0753  BP:  120/73    Pulse:  77    Resp:  16    Temp:  (!) 97.5 F (36.4 C)    TempSrc:  Oral    SpO2:  99% 98% 98%  Weight: 94.7 kg     Height:        Intake/Output Summary (Last 24 hours) at 08/27/2018 1319 Last data filed at 08/27/2018 0902 Gross per 24 hour  Intake 243 ml  Output -  Net 243 ml   Filed Weights   08/25/18 0500 08/26/18 0416 08/27/18 0500  Weight: 99.2 kg 96.3 kg 94.7 kg    Examination: Gen: Awake, Alert, Oriented X 3, obese female, sitting up in bed, no distress HEENT: PERRLA, Neck supple, no JVD Lungs: Poor air movement bilaterally, upper airway wheeze noted CVS: RRR,No Gallops,Rubs or new Murmurs Abd: soft, Non tender, non distended, BS present Extremities: No edema Skin: no new rashes Psychiatry: Judgement and insight appear normal. Mood & affect appropriate.     Data Reviewed:   CBC: Recent Labs  Lab 08/24/18 1225 08/25/18 0212 08/26/18 0344 08/27/18 0227  WBC 15.7* 16.6* 19.1* 18.2*  NEUTROABS 13.0*  --   --   --   HGB 13.7 12.0 13.4 13.9  HCT 41.5 36.4 41.4 42.9  MCV 86.8 85.8 86.8 86.7  PLT 189 170 171  284   Basic Metabolic Panel: Recent Labs  Lab 08/24/18 1225 08/25/18 0212 08/26/18 0344 08/27/18 0227  NA 137 136 138 133*  K 4.2 4.3 2.9* 3.7  CL 101 102 94* 92*  CO2 25 23 32 27  GLUCOSE 205* 353* 61* 233*  BUN 28* 23 32* 41*  CREATININE 1.19* 1.01* 1.25* 1.44*  CALCIUM 8.8* 8.6* 9.2 9.2   GFR: Estimated Creatinine Clearance: 35.6 mL/min (A) (by C-G formula based on  SCr of 1.44 mg/dL (H)). Liver Function Tests: No results for input(s): AST, ALT, ALKPHOS, BILITOT, PROT, ALBUMIN in the last 168 hours. No results for input(s): LIPASE, AMYLASE in the last 168 hours. No results for input(s): AMMONIA in the last 168 hours. Coagulation Profile: No results for input(s): INR, PROTIME in the last 168 hours. Cardiac Enzymes: No results for input(s): CKTOTAL, CKMB, CKMBINDEX, TROPONINI in the last 168 hours. BNP (last 3 results) No results for input(s): PROBNP in the last 8760 hours. HbA1C: Recent Labs    08/25/18 0212  HGBA1C 10.1*   CBG: Recent Labs  Lab 08/26/18 1214 08/26/18 1653 08/26/18 2005 08/27/18 0724 08/27/18 1214  GLUCAP 275* 499* 401* 205* 195*   Lipid Profile: No results for input(s): CHOL, HDL, LDLCALC, TRIG, CHOLHDL, LDLDIRECT in the last 72 hours. Thyroid Function Tests: Recent Labs    08/24/18 1931  TSH 0.210*   Anemia Panel: No results for input(s): VITAMINB12, FOLATE, FERRITIN, TIBC, IRON, RETICCTPCT in the last 72 hours. Urine analysis:    Component Value Date/Time   COLORURINE YELLOW 09/02/2013 1340   APPEARANCEUR CLEAR 09/02/2013 1340   LABSPEC 1.029 09/02/2013 1340   PHURINE 6.0 09/02/2013 1340   GLUCOSEU NEGATIVE 09/02/2013 1340   GLUCOSEU NEGATIVE 10/30/2009 1006   HGBUR NEGATIVE 09/02/2013 1340   BILIRUBINUR NEGATIVE 09/02/2013 1340   KETONESUR NEGATIVE 09/02/2013 1340   PROTEINUR 30 (A) 09/02/2013 1340   UROBILINOGEN 0.2 09/02/2013 1340   NITRITE NEGATIVE 09/02/2013 1340   LEUKOCYTESUR NEGATIVE 09/02/2013 1340   Sepsis Labs: @LABRCNTIP (procalcitonin:4,lacticidven:4)  ) Recent Results (from the past 240 hour(s))  Respiratory Panel by PCR     Status: None   Collection Time: 08/24/18  7:30 PM  Result Value Ref Range Status   Adenovirus NOT DETECTED NOT DETECTED Final   Coronavirus 229E NOT DETECTED NOT DETECTED Final   Coronavirus HKU1 NOT DETECTED NOT DETECTED Final   Coronavirus NL63 NOT DETECTED  NOT DETECTED Final   Coronavirus OC43 NOT DETECTED NOT DETECTED Final   Metapneumovirus NOT DETECTED NOT DETECTED Final   Rhinovirus / Enterovirus NOT DETECTED NOT DETECTED Final   Influenza A NOT DETECTED NOT DETECTED Final   Influenza B NOT DETECTED NOT DETECTED Final   Parainfluenza Virus 1 NOT DETECTED NOT DETECTED Final   Parainfluenza Virus 2 NOT DETECTED NOT DETECTED Final   Parainfluenza Virus 3 NOT DETECTED NOT DETECTED Final   Parainfluenza Virus 4 NOT DETECTED NOT DETECTED Final   Respiratory Syncytial Virus NOT DETECTED NOT DETECTED Final   Bordetella pertussis NOT DETECTED NOT DETECTED Final   Chlamydophila pneumoniae NOT DETECTED NOT DETECTED Final   Mycoplasma pneumoniae NOT DETECTED NOT DETECTED Final         Radiology Studies: No results found.      Scheduled Meds: . aspirin EC  81 mg Oral Daily  . benzonatate  100 mg Oral TID  . chlorpheniramine-HYDROcodone  5 mL Oral Q12H  . enoxaparin (LOVENOX) injection  40 mg Subcutaneous Q24H  . ferrous sulfate  325 mg Oral Q  breakfast  . [START ON 08/28/2018] furosemide  40 mg Oral Daily  . gabapentin  300 mg Oral BID  . insulin aspart  0-20 Units Subcutaneous TID WC  . insulin aspart  0-5 Units Subcutaneous QHS  . insulin aspart  8 Units Subcutaneous TID WC  . insulin glargine  65 Units Subcutaneous QHS  . ipratropium-albuterol  3 mL Nebulization Q6H  . mometasone-formoterol  2 puff Inhalation BID  . montelukast  10 mg Oral QHS  . pantoprazole  80 mg Oral BID  . potassium chloride  40 mEq Oral Daily  . [START ON 08/28/2018] predniSONE  20 mg Oral Q breakfast  . rOPINIRole  4 mg Oral QHS  . sertraline  100 mg Oral Daily  . simvastatin  40 mg Oral QHS  . sodium chloride flush  3 mL Intravenous Q12H  . sodium chloride flush  3 mL Intravenous Q12H  . traZODone  100 mg Oral QHS   Continuous Infusions: . sodium chloride       LOS: 3 days    Time spent: 88min    Domenic Polite, MD [facility] Page via [email or web address], password [email or web address] After 7PM please contact night-coverage  08/27/2018, 1:19 PM

## 2018-08-27 NOTE — Progress Notes (Signed)
   NAMEARIYANA FAW, MRN:  [id number], DOB:  04-15-1941, LOS: 3 ADMISSION DATE:  08/24/2018, CONSULTATION DATE: August 24, 2018 REFERRING MD: Dr. Evangeline Gula, CHIEF COMPLAINT: Dyspnea  Brief History   78 year old female with a past medical history significant for asthma came to the emergency department on August 24, 2018 complaining of 5 weeks of cough and shortness of breath.  Pulmonary and critical care medicine was consulted for management of possible refractory asthma.  Past Medical History  Diabetes mellitus type 2 Hypertension Severe persistent asthma  Significant Hospital Events   January 10 admitted  Consults:  Pulmonology  Procedures:  None  Significant Diagnostic Tests:  CT angiogram chest images independently reviewed from August 24, 2018: No pulmonary embolism, no significant pulmonary parenchymal abnormality noted, some coronary calcifications  Micro Data:  resp viral panel > negative  Antimicrobials:      Interim history/subjective:  Feels better but still not ready to go home Objective   Blood pressure 120/73, pulse 77, temperature (Abnormal) 97.5 F (36.4 C), temperature source Oral, resp. rate 16, height 5\' 2"  (1.575 m), weight 94.7 kg, SpO2 98 %.        Intake/Output Summary (Last 24 hours) at 08/27/2018 1044 Last data filed at 08/27/2018 0902 Gross per 24 hour  Intake 246 ml  Output no documentation  Net 246 ml   Filed Weights   08/25/18 0500 08/26/18 0416 08/27/18 0500  Weight: 99.2 kg 96.3 kg 94.7 kg    Examination:  General: Obese 78 year old white female resting in bed she is in no acute distress but does get significantly short of breath after coughing. HEENT marked upper airway wheezing.  Hoarse phonation. Pulmonary: Clear to auscultation some accessory use after cough Cardiac: Regular rate and rhythm Abdomen: Soft nontender no organomegaly Extremities: Warm dry brisk capillary refill Neuro: Awake, oriented, no distress.  She is  anxious.  Resolved Hospital Problem list     Assessment & Plan:  Wheezing Dyspnea  GERD Acute on chronic diastolic dysfxn  Cough Anxiety LE edme  HTN  Poorly controlled DM  Dyslipidemia   78 year old female with a past medical history significant for asthma (no PFTs, no formal work-up available for my review)   Wheezing (upper airway) and Dyspnea. Likely 2/2 Vocal Cord dysfxn exacerbated by GERD/reflux & Cough -may have had element of diastolic dysfxn contributing Plan PRN albuterol  Wean steroids PPI BID Cough suppression  Stopping ace I rx anxiety Needs pulm fxn test as out-pt (this has been scheduled) Might benefit from exhaled NO testing once off pred     Erick Colace ACNP-BC [place] Pager # [phone] OR # [phone] if no answer

## 2018-08-28 LAB — GLUCOSE, CAPILLARY
Glucose-Capillary: 290 mg/dL — ABNORMAL HIGH (ref 70–99)
Glucose-Capillary: 213 mg/dL — ABNORMAL HIGH (ref 70–99)
Glucose-Capillary: 333 mg/dL — ABNORMAL HIGH (ref 70–99)
Glucose-Capillary: 159 mg/dL — ABNORMAL HIGH (ref 70–99)
Glucose-Capillary: 248 mg/dL — ABNORMAL HIGH (ref 70–99)

## 2018-08-28 MED ORDER — INSULIN GLARGINE 100 UNIT/ML ~~LOC~~ SOLN
75.0000 [IU] | Freq: Every day | SUBCUTANEOUS | Status: DC
  Administered 2018-08-28 – 2018-08-29 (×2): 75 [IU] via SUBCUTANEOUS
  Filled 2018-08-28 (×3): qty 0.75

## 2018-08-28 NOTE — Evaluation (Signed)
Physical Therapy Evaluation Patient Details Name: Carrie White MRN: [id number] DOB: 12-15-1940 Today's Date: 08/28/2018   History of Present Illness  78yo female with severe persistent asthma, presenting with ongoing wheezing and bronchitis. Admitted due to chronic obstructive asthma. PMH DM, fibromyalgia, HTN, hx TKR   Clinical Impression   Patient received sitting at chair at sink taking sponge bath, pleasant and willing to work with PT, SpO2 96% on room air. Able to complete functional transfers with MinA, and tolerated gait approximately 55fx2 with RW, one seated rest break and SpO2 remaining at 94% on room air. Able to transfer back to bed with MinA for transfers with RW, assisted in completed bathing at EOB with S for dynamic sitting balance and MinA for transfers to don/doff briefs. Education provided regarding safety and not getting up without nursing staff present. She was left sitting at EOB with bed alarm active and all needs otherwise met.     Follow Up Recommendations SNF    Equipment Recommendations  Other (comment)(defer to next venue )    Recommendations for Other Services       Precautions / Restrictions Precautions Precautions: Fall Restrictions Weight Bearing Restrictions: No      Mobility  Bed Mobility               General bed mobility comments: OOB in chair   Transfers Overall transfer level: Needs assistance Equipment used: Rolling walker (2 wheeled) Transfers: Sit to/from Stand Sit to Stand: Min assist         General transfer comment: MinA for balance and steadying, cues for safety   Ambulation/Gait Ambulation/Gait assistance: Min guard Gait Distance (Feet): 40 Feet(x2) Assistive device: Rolling walker (2 wheeled) Gait Pattern/deviations: Step-through pattern;Decreased step length - right;Decreased step length - left;Decreased dorsiflexion - right;Decreased dorsiflexion - left;Drifts right/left Gait velocity: decreased    General Gait  Details: slow gait pattern, mildly unsteady and requiring min guard for safety   Stairs            Wheelchair Mobility    Modified Rankin (Stroke Patients Only)       Balance Overall balance assessment: Needs assistance Sitting-balance support: Feet supported Sitting balance-Leahy Scale: Good Sitting balance - Comments: bathed at EOB, dynamic reaching during bathing with S    Standing balance support: Bilateral upper extremity supported;During functional activity Standing balance-Leahy Scale: Fair Standing balance comment: reliant on B UE support, min guard for safety                              Pertinent Vitals/Pain Pain Assessment: No/denies pain    Home Living Family/patient expects to be discharged to:: Private residence Living Arrangements: Alone   Type of Home: House Home Access: Stairs to enter   [employer] Steps: 1 Home Layout: Multi-level;Able to live on main level with bedroom/bathroom Home Equipment: None      Prior Function Level of Independence: Independent with assistive device(s)               Hand Dominance        Extremity/Trunk Assessment   Upper Extremity Assessment Upper Extremity Assessment: Defer to OT evaluation    Lower Extremity Assessment Lower Extremity Assessment: Generalized weakness    Cervical / Trunk Assessment Cervical / Trunk Assessment: Kyphotic  Communication   Communication: No difficulties  Cognition Arousal/Alertness: Awake/alert Behavior During Therapy: WFL for tasks assessed/performed Overall Cognitive Status: Within Functional Limits for tasks  assessed                                        General Comments      Exercises     Assessment/Plan    PT Assessment Patient needs continued PT services  PT Problem List Decreased strength;Decreased coordination;Decreased activity tolerance;Decreased knowledge of use of DME;Decreased balance;Decreased safety  awareness;Decreased mobility       PT Treatment Interventions DME instruction;Therapeutic exercise;Gait training;Balance training;Stair training;Neuromuscular re-education;Functional mobility training;Therapeutic activities;Patient/family education    PT Goals (Current goals can be found in the Care Plan section)  Acute Rehab PT Goals Patient Stated Goal: get stronger  PT Goal Formulation: With patient Time For Goal Achievement: 09/11/18 Potential to Achieve Goals: Good    Frequency Min 2X/week   Barriers to discharge        Co-evaluation               AM-PAC PT "6 Clicks" Mobility  Outcome Measure Help needed turning from your back to your side while in a flat bed without using bedrails?: A Little Help needed moving from lying on your back to sitting on the side of a flat bed without using bedrails?: A Little Help needed moving to and from a bed to a chair (including a wheelchair)?: A Little Help needed standing up from a chair using your arms (e.g., wheelchair or bedside chair)?: A Little Help needed to walk in hospital room?: A Little Help needed climbing 3-5 steps with a railing? : A Lot 6 Click Score: 17    End of Session   Activity Tolerance: Patient tolerated treatment well Patient left: in bed;with bed alarm set;with call bell/phone within reach(sitting at EOB )   PT Visit Diagnosis: Unsteadiness on feet (R26.81);Muscle weakness (generalized) (M62.81);Difficulty in walking, not elsewhere classified (R26.2)    Time: 5053-9767 PT Time Calculation (min) (ACUTE ONLY): 30 min   Charges:   PT Evaluation $PT Eval Low Complexity: 1 Low PT Treatments $Gait Training: 8-22 mins        Deniece Ree PT, DPT, CBIS  Supplemental Physical Therapist [place]    Pager [phone] Acute Rehab Office [phone]

## 2018-08-28 NOTE — NC FL2 (Signed)
[place] MEDICAID FL2 LEVEL OF CARE SCREENING TOOL     IDENTIFICATION  Patient Name: Carrie White Birthdate: [birth year] Sex: female Admission Date (Current Location): 08/24/2018  [facility] and [place] Number:  Herbalist and Address:  [facility], [place] [address] [place], [place] [address]      Provider Number: [id number]  Attending Physician Name and Address:  Domenic Polite, MD  Relative Name and Phone Number:  Lelon Frohlich niece, [phone]    Current Level of Care: Hospital Recommended Level of Care: [place] Prior Approval Number:    Date Approved/Denied:   PASRR Number: [id number]  Discharge Plan: SNF    Current Diagnoses: Patient Active Problem List   Diagnosis Date Noted  . Chronic obstructive asthma with acute exacerbation of asthma ([place]) 08/24/2018  . Restless leg syndrome 10/11/2013  . Depression 10/03/2013  . Anxiety 10/03/2013  . Knee pain, left 09/29/2013  . Arthritis of left knee 09/09/2013  . Type I (juvenile type) diabetes mellitus with neurological manifestations, not stated as uncontrolled(250.61) 06/27/2013  . De Quervain's tenosynovitis, bilateral 06/26/2013  . Osteoarthritis, knee 06/05/2013  . Lateral epicondylitis (tennis elbow) 06/05/2013  . Routine health maintenance 10/23/2012  . Grief 10/22/2012  . GOITER, MULTINODULAR 07/01/2010  . DYSURIA 10/30/2009  . VAGINITIS, CANDIDAL 10/28/2009  . DIARRHEA, PERSISTENT 09/25/2009  . INSOMNIA, CHRONIC 06/29/2009  . UNS ADVRS EFF UNS RX MEDICINAL&BIOLOGICAL SBSTNC 02/24/2009  . DM (diabetes mellitus), type 2, uncontrolled ([place]) 08/01/2008  . Essential hypertension 08/01/2008  . Type 2 diabetes mellitus with hyperlipidemia ([place]) 07/31/2008  . RESTLESS LEG SYNDROME 05/07/2007  . OSTEOARTHRITIS 05/07/2007  . FIBROMYALGIA 05/07/2007    Orientation RESPIRATION BLADDER Height & Weight     Self, Time, Situation, Place  O2(Nasal Cannula 1L) Continent  Weight: 94.7 kg Height:  5\' 2"  (157.5 cm)  BEHAVIORAL SYMPTOMS/MOOD NEUROLOGICAL BOWEL NUTRITION STATUS      Continent Diet  AMBULATORY STATUS COMMUNICATION OF NEEDS Skin   Limited Assist Verbally Normal                       Personal Care Assistance Level of Assistance  Bathing, Feeding, Dressing Bathing Assistance: Independent Feeding assistance: Independent Dressing Assistance: Independent     Functional Limitations Info  Sight, Hearing, Speech Sight Info: Adequate Hearing Info: Adequate Speech Info: Adequate    SPECIAL CARE FACTORS FREQUENCY  PT (By licensed PT), OT (By licensed OT)     PT Frequency: 5x/week OT Frequency: 3x/week            Contractures Contractures Info: Not present    Additional Factors Info  Code Status, Allergies Code Status Info: Full Allergies Info: NKA           Current Medications (08/28/2018):  This is the current hospital active medication list Current Facility-Administered Medications  Medication Dose Route Frequency Provider Last Rate Last Dose  . 0.9 %  sodium chloride infusion  250 mL Intravenous PRN Lady Deutscher, MD      . acetaminophen (TYLENOL) tablet 650 mg  650 mg Oral Q6H PRN Lady Deutscher, MD       Or  . acetaminophen (TYLENOL) suppository 650 mg  650 mg Rectal Q6H PRN Lady Deutscher, MD      . albuterol (PROVENTIL) (2.5 MG/3ML) 0.083% nebulizer solution 2.5 mg  2.5 mg Nebulization Q2H PRN Lady Deutscher, MD      . aspirin EC tablet 81 mg  81  mg Oral Daily Lady Deutscher, MD   81 mg at 08/28/18 0737  . benzonatate (TESSALON) capsule 100 mg  100 mg Oral TID Domenic Polite, MD   100 mg at 08/28/18 0957  . chlorpheniramine-HYDROcodone (TUSSIONEX) 10-8 MG/5ML suspension 5 mL  5 mL Oral Q12H Erick Colace, NP   5 mL at 08/28/18 0958  . enoxaparin (LOVENOX) injection 40 mg  40 mg Subcutaneous Q24H Lady Deutscher, MD   40 mg at 08/27/18 2015  . ferrous sulfate tablet 325 mg  325 mg Oral Q  breakfast Lady Deutscher, MD   325 mg at 08/28/18 0831  . furosemide (LASIX) tablet 40 mg  40 mg Oral Daily Domenic Polite, MD   40 mg at 08/28/18 0957  . gabapentin (NEURONTIN) capsule 300 mg  300 mg Oral BID Lady Deutscher, MD   300 mg at 08/28/18 1062  . insulin aspart (novoLOG) injection 0-20 Units  0-20 Units Subcutaneous TID WC Lady Deutscher, MD   7 Units at 08/28/18 1207  . insulin aspart (novoLOG) injection 0-5 Units  0-5 Units Subcutaneous QHS Lady Deutscher, MD   5 Units at 08/27/18 2216  . insulin aspart (novoLOG) injection 8 Units  8 Units Subcutaneous TID WC Domenic Polite, MD   8 Units at 08/28/18 1207  . insulin glargine (LANTUS) injection 75 Units  75 Units Subcutaneous QHS Domenic Polite, MD      . ipratropium-albuterol (DUONEB) 0.5-2.5 (3) MG/3ML nebulizer solution 3 mL  3 mL Nebulization Q6H Domenic Polite, MD   3 mL at 08/28/18 1324  . LORazepam (ATIVAN) injection 0.5-1 mg  0.5-1 mg Intravenous Q4H PRN Juanito Doom, MD   1 mg at 08/27/18 2221  . mometasone-formoterol (DULERA) 200-5 MCG/ACT inhaler 2 puff  2 puff Inhalation BID Lady Deutscher, MD   2 puff at 08/28/18 0912  . montelukast (SINGULAIR) tablet 10 mg  10 mg Oral QHS Lady Deutscher, MD   10 mg at 08/27/18 2110  . pantoprazole (PROTONIX) EC tablet 80 mg  80 mg Oral BID Lady Deutscher, MD   80 mg at 08/28/18 0957  . polyethylene glycol (MIRALAX / GLYCOLAX) packet 17 g  17 g Oral Daily PRN Lady Deutscher, MD      . potassium chloride SA (K-DUR,KLOR-CON) CR tablet 40 mEq  40 mEq Oral Daily Domenic Polite, MD   40 mEq at 08/28/18 0958  . predniSONE (DELTASONE) tablet 20 mg  20 mg Oral Q breakfast Erick Colace, NP   20 mg at 08/28/18 0831  . rOPINIRole (REQUIP) tablet 4 mg  4 mg Oral QHS Lady Deutscher, MD   4 mg at 08/27/18 2109  . sertraline (ZOLOFT) tablet 100 mg  100 mg Oral Daily Lady Deutscher, MD   100 mg at 08/28/18 0958  . simvastatin (ZOCOR) tablet 40 mg  40 mg  Oral QHS Lady Deutscher, MD   40 mg at 08/27/18 2111  . sodium chloride flush (NS) 0.9 % injection 3 mL  3 mL Intravenous Q12H Lady Deutscher, MD   3 mL at 08/28/18 0959  . sodium chloride flush (NS) 0.9 % injection 3 mL  3 mL Intravenous Q12H Lady Deutscher, MD   3 mL at 08/28/18 0958  . sodium chloride flush (NS) 0.9 % injection 3 mL  3 mL Intravenous PRN Lady Deutscher, MD      . traMADol Veatrice Bourbon) tablet 100 mg  100 mg  Oral Q6H PRN Lady Deutscher, MD   100 mg at 08/27/18 2110  . traZODone (DESYREL) tablet 100 mg  100 mg Oral QHS Lady Deutscher, MD   100 mg at 08/27/18 2109     Discharge Medications: Please see discharge summary for a list of discharge medications.  Relevant Imaging Results:  Relevant Lab Results:   Additional Information SSN: Caspar Carleton, [place]

## 2018-08-28 NOTE — Care Management Important Message (Signed)
Important Message  Patient Details  Name: Carrie White MRN: [id number] Date of Birth: 01/22/1941   Medicare Important Message Given:  Yes    Makayli Bracken Montine Circle 08/28/2018, 4:20 PM

## 2018-08-28 NOTE — Care Management Note (Signed)
Case Management Note  Patient Details  Name: Carrie White MRN: [id number] Date of Birth: [birth year]  Subjective/Objective:      From home alone, chronic obstructive asthma with acute ex, very frail, obese, has wheeze, cough.  Pt eval rec SNF, CSW following for placement.              Action/Plan: DC to SNF when medically ready.  Expected Discharge Date:                  Expected Discharge Plan:  Skilled Nursing Facility  In-House Referral:  Clinical Social Work  Discharge planning Services  CM Consult  Post Acute Care Choice:    Choice offered to:     DME Arranged:    DME Agency:     HH Arranged:    [place] Agency:     Status of Service:  Completed, signed off  If discussed at [employer], dates discussed:    Additional Comments:  Zenon Mayo, RN 08/28/2018, 4:43 PM

## 2018-08-28 NOTE — Progress Notes (Signed)
CSW spoke with patient's niece to let her know that [facility] or [facility] do not have beds available. She selected [facility] as the next choice and accepted a semi-private room. [facility] will begin insurance authorization process.  Percell Locus Camaya [employer] LCSW [phone]

## 2018-08-28 NOTE — Significant Event (Signed)
Rapid Response Event Note  Overview: Neurologic  Initial Focused Assessment: Called by staff about being unresponsive, per staff, patient has a pulse and is breathing. Upon arrival, patient was very, very hard to arouse. Per RN, patient received scheduled nightly medications (which patient takes at home) and that last night, patient presented like this too. + CR, pupils react, upon assessing for gag reflex, patient woke up, able to follow commands, answers questions. VSS. Blood sugar 248.  Interventions: - None-  Plan of Care: - None-   Event Summary:    at    Call Time 2249 Arrival Time 2250 End Time 2315  Tanush Drees R

## 2018-08-28 NOTE — Progress Notes (Addendum)
PROGRESS NOTE    Carrie White  WJX:[id number] DOB: May 31, 1941 DOA: 08/24/2018 PCP: Rodena Medin, MD (Inactive)  Brief Narrative: This is a 78 year old female with prior history of asthma, recurrent bronchitis, never had PFTs presented to the emergency room last night with 5 weeks of cough wheezing and shortness of breath. -History of tobacco use, reports that her asthma symptoms only started until about 2 years ago has never seen a pulmonologist or had PFTs, she was wheezing in the emergency room but this was felt to be an upper airway wheeze -CT angiogram was negative for PE, some coronary atherosclerosis noted -was also noted to have some chronic lower extremity edema and reportedly takes diuretics at baseline, no history of CHF -Slow improvement with low-dose steroids, diuretics and supportive care  Assessment & Plan:   Acute hypoxic respiratory failure  -History of asthma admitted with exacerbation, symptoms more concerning for vocal cord dysfunction,/GERD causing upper airway wheeze, anxiety  -Symptomatic with cough/congestion/wheezing for 5 weeks prior to admission, Non smoker -Appreciate pulmonary consult, suspected to be less likely asthma, more upper airway wheeze possibly from GERD or VCD -Echo with preserved EF, diastolic dysfunction -Slowly improving, still continues to have significant symptom burden -Continue low-dose prednisone, will taper this slowly over 4 to 5 days -Continue low-dose diuretic as well -Wean off oxygen as tolerated -Appreciate pulmonary input, close follow-up and PFTs recommended -She would also benefit from weight loss and a sleep study as outpatient -Ambulate, out of bed, PT eval  Acute on chronic diastolic CHF  -Improving with diuresis  -Is euvolemic now, continue low-dose Lasix orally daily -Echocardiogram with preserved EF  GERD -Protonix daily, dose increased  Leukocytosis -Felt to be secondary to steroids, afebrile and nontoxic,  monitor  Diabetes mellitus/uncontrolled -Held 70/30 insulin, she was started on Lantus, pre-meal NovoLog and sliding scale due to worsening hyperglycemia in the setting of steroids, -Continue Lantus, increased dose and pre-meal NovoLog  Essential hypertension Continue home regimen  Dyslipidemia Continue statin  DVT prophylaxis: Lovenox Code Status: Full code Family Communication: No family at bedside Disposition Plan: Rehab in 1 to 2 days  Consultants:   Pulm   Procedures:   Antimicrobials:    Subjective: -Slowly improving, not back to baseline yet, continues to have cough congestion and dyspnea with minimal activity   Objective: Vitals:   08/28/18 0031 08/28/18 0120 08/28/18 0746 08/28/18 0900  BP: (!) 112/49  138/68   Pulse: 61  65   Resp: 12     Temp: (!) 97.3 F (36.3 C)  (!) 97.4 F (36.3 C)   TempSrc:   Oral   SpO2: 96% 96% 95% 99%  Weight:      Height:        Intake/Output Summary (Last 24 hours) at 08/28/2018 1315 Last data filed at 08/28/2018 0959 Gross per 24 hour  Intake 246 ml  Output -  Net 246 ml   Filed Weights   08/25/18 0500 08/26/18 0416 08/27/18 0500  Weight: 99.2 kg 96.3 kg 94.7 kg    Examination: Gen: Awake, Alert, Oriented X 3, is female, sitting up in bed, no distress HEENT: PERRLA, Neck supple, no JVD Lungs: Poor air movement, faint upper expiratory wheeze noted CVS: RRR,No Gallops,Rubs or new Murmurs Abd: soft, Non tender, non distended, BS present Extremities: No edema Psychiatry: Judgement and insight appear normal. Mood & affect appropriate.     Data Reviewed:   CBC: Recent Labs  Lab 08/24/18 1225 08/25/18 2130 08/26/18 0344 08/27/18 8657  WBC 15.7* 16.6* 19.1* 18.2*  NEUTROABS 13.0*  --   --   --   HGB 13.7 12.0 13.4 13.9  HCT 41.5 36.4 41.4 42.9  MCV 86.8 85.8 86.8 86.7  PLT 189 170 171 185   Basic Metabolic Panel: Recent Labs  Lab 08/24/18 1225 08/25/18 0212 08/26/18 0344 08/27/18 0227  NA 137 136  138 133*  K 4.2 4.3 2.9* 3.7  CL 101 102 94* 92*  CO2 25 23 32 27  GLUCOSE 205* 353* 61* 233*  BUN 28* 23 32* 41*  CREATININE 1.19* 1.01* 1.25* 1.44*  CALCIUM 8.8* 8.6* 9.2 9.2   GFR: Estimated Creatinine Clearance: 35.6 mL/min (A) (by C-G formula based on SCr of 1.44 mg/dL (H)). Liver Function Tests: No results for input(s): AST, ALT, ALKPHOS, BILITOT, PROT, ALBUMIN in the last 168 hours. No results for input(s): LIPASE, AMYLASE in the last 168 hours. No results for input(s): AMMONIA in the last 168 hours. Coagulation Profile: No results for input(s): INR, PROTIME in the last 168 hours. Cardiac Enzymes: No results for input(s): CKTOTAL, CKMB, CKMBINDEX, TROPONINI in the last 168 hours. BNP (last 3 results) No results for input(s): PROBNP in the last 8760 hours. HbA1C: No results for input(s): HGBA1C in the last 72 hours. CBG: Recent Labs  Lab 08/27/18 1214 08/27/18 1703 08/27/18 2143 08/28/18 0737 08/28/18 1149  GLUCAP 195* 383* 377* 290* 213*   Lipid Profile: No results for input(s): CHOL, HDL, LDLCALC, TRIG, CHOLHDL, LDLDIRECT in the last 72 hours. Thyroid Function Tests: No results for input(s): TSH, T4TOTAL, FREET4, T3FREE, THYROIDAB in the last 72 hours. Anemia Panel: No results for input(s): VITAMINB12, FOLATE, FERRITIN, TIBC, IRON, RETICCTPCT in the last 72 hours. Urine analysis:    Component Value Date/Time   COLORURINE YELLOW 09/02/2013 1340   APPEARANCEUR CLEAR 09/02/2013 1340   LABSPEC 1.029 09/02/2013 1340   PHURINE 6.0 09/02/2013 1340   GLUCOSEU NEGATIVE 09/02/2013 1340   GLUCOSEU NEGATIVE 10/30/2009 1006   HGBUR NEGATIVE 09/02/2013 1340   BILIRUBINUR NEGATIVE 09/02/2013 1340   KETONESUR NEGATIVE 09/02/2013 1340   PROTEINUR 30 (A) 09/02/2013 1340   UROBILINOGEN 0.2 09/02/2013 1340   NITRITE NEGATIVE 09/02/2013 1340   LEUKOCYTESUR NEGATIVE 09/02/2013 1340   Sepsis Labs: @LABRCNTIP (procalcitonin:4,lacticidven:4)  ) Recent Results (from the past  240 hour(s))  Respiratory Panel by PCR     Status: None   Collection Time: 08/24/18  7:30 PM  Result Value Ref Range Status   Adenovirus NOT DETECTED NOT DETECTED Final   Coronavirus 229E NOT DETECTED NOT DETECTED Final   Coronavirus HKU1 NOT DETECTED NOT DETECTED Final   Coronavirus NL63 NOT DETECTED NOT DETECTED Final   Coronavirus OC43 NOT DETECTED NOT DETECTED Final   Metapneumovirus NOT DETECTED NOT DETECTED Final   Rhinovirus / Enterovirus NOT DETECTED NOT DETECTED Final   Influenza A NOT DETECTED NOT DETECTED Final   Influenza B NOT DETECTED NOT DETECTED Final   Parainfluenza Virus 1 NOT DETECTED NOT DETECTED Final   Parainfluenza Virus 2 NOT DETECTED NOT DETECTED Final   Parainfluenza Virus 3 NOT DETECTED NOT DETECTED Final   Parainfluenza Virus 4 NOT DETECTED NOT DETECTED Final   Respiratory Syncytial Virus NOT DETECTED NOT DETECTED Final   Bordetella pertussis NOT DETECTED NOT DETECTED Final   Chlamydophila pneumoniae NOT DETECTED NOT DETECTED Final   Mycoplasma pneumoniae NOT DETECTED NOT DETECTED Final         Radiology Studies: No results found.      Scheduled Meds: . aspirin EC  81 mg Oral Daily  . benzonatate  100 mg Oral TID  . chlorpheniramine-HYDROcodone  5 mL Oral Q12H  . enoxaparin (LOVENOX) injection  40 mg Subcutaneous Q24H  . ferrous sulfate  325 mg Oral Q breakfast  . furosemide  40 mg Oral Daily  . gabapentin  300 mg Oral BID  . insulin aspart  0-20 Units Subcutaneous TID WC  . insulin aspart  0-5 Units Subcutaneous QHS  . insulin aspart  8 Units Subcutaneous TID WC  . insulin glargine  75 Units Subcutaneous QHS  . ipratropium-albuterol  3 mL Nebulization Q6H  . mometasone-formoterol  2 puff Inhalation BID  . montelukast  10 mg Oral QHS  . pantoprazole  80 mg Oral BID  . potassium chloride  40 mEq Oral Daily  . predniSONE  20 mg Oral Q breakfast  . rOPINIRole  4 mg Oral QHS  . sertraline  100 mg Oral Daily  . simvastatin  40 mg Oral QHS   . sodium chloride flush  3 mL Intravenous Q12H  . sodium chloride flush  3 mL Intravenous Q12H  . traZODone  100 mg Oral QHS   Continuous Infusions: . sodium chloride       LOS: 4 days    Time spent: 54min    Domenic Polite, MD [facility] Page via [email or web address], password [email or web address] After 7PM please contact night-coverage  08/28/2018, 1:15 PM

## 2018-08-29 ENCOUNTER — Telehealth: Payer: Self-pay

## 2018-08-29 LAB — CBC
HCT: 41.7 % (ref 36.0–46.0)
HEMOGLOBIN: 13.9 g/dL (ref 12.0–15.0)
MCH: 29.2 pg (ref 26.0–34.0)
MCHC: 33.3 g/dL (ref 30.0–36.0)
MCV: 87.6 fL (ref 80.0–100.0)
Platelets: 210 10*3/uL (ref 150–400)
RBC: 4.76 MIL/uL (ref 3.87–5.11)
RDW: 14.1 % (ref 11.5–15.5)
WBC: 15.1 10*3/uL — ABNORMAL HIGH (ref 4.0–10.5)
nRBC: 0 % (ref 0.0–0.2)

## 2018-08-29 LAB — BASIC METABOLIC PANEL
Anion gap: 13 (ref 5–15)
BUN: 38 mg/dL — ABNORMAL HIGH (ref 8–23)
CHLORIDE: 92 mmol/L — AB (ref 98–111)
CO2: 29 mmol/L (ref 22–32)
Calcium: 9.4 mg/dL (ref 8.9–10.3)
Creatinine, Ser: 1.13 mg/dL — ABNORMAL HIGH (ref 0.44–1.00)
GFR calc Af Amer: 55 mL/min — ABNORMAL LOW (ref >60)
GFR calc non Af Amer: 47 mL/min — ABNORMAL LOW (ref >60)
GLUCOSE: 126 mg/dL — AB (ref 70–99)
Potassium: 4.6 mmol/L (ref 3.5–5.1)
Sodium: 134 mmol/L — ABNORMAL LOW (ref 135–145)

## 2018-08-29 LAB — GLUCOSE, CAPILLARY
Glucose-Capillary: 103 mg/dL — ABNORMAL HIGH (ref 70–99)
Glucose-Capillary: 194 mg/dL — ABNORMAL HIGH (ref 70–99)
Glucose-Capillary: 318 mg/dL — ABNORMAL HIGH (ref 70–99)
Glucose-Capillary: 204 mg/dL — ABNORMAL HIGH (ref 70–99)

## 2018-08-29 NOTE — Telephone Encounter (Signed)
lmtcb for pt. Per BQ and TP, we need to move pt's follow-up from [place] to [place] if pt is willing.  Please reschedule appt to [place] when pt calls back.

## 2018-08-29 NOTE — Telephone Encounter (Signed)
-----   Message from Melvenia Needles, NP sent at 08/27/2018  1:43 PM EST ----- Thanks Ruby Cola .  Caryl Pina can you call them and get changed. They set her up with Beth at [facility] office. That is why we cant get patients in [place] . She lives in [place] but was offered a [place] visit. Not sure how to fix this thought pattern.   ----- Message ----- From: Juanito Doom, MD Sent: 08/24/2018   5:32 PM EST To: Melvenia Needles, NP, Len Blalock, CMA  Hi,  Evelynne here needs outpatient follow up with [place] in [facility].  She would make a great [facility] follow up!!!  Ruby Cola

## 2018-08-29 NOTE — Progress Notes (Signed)
PROGRESS NOTE    Carrie White  CHY:[id number] DOB: 1941-06-27 DOA: 08/24/2018 PCP: Rodena Medin, MD (Inactive)  Brief Narrative: This is a 78 year old female with prior history of asthma, recurrent bronchitis, never had PFTs presented to the emergency room last night with 5 weeks of cough wheezing and shortness of breath. -History of tobacco use, reports that her asthma symptoms only started until about 2 years ago has never seen a pulmonologist or had PFTs, she was wheezing in the emergency room but this was felt to be an upper airway wheeze -CT angiogram was negative for PE, some coronary atherosclerosis noted -was also noted to have some chronic lower extremity edema and reportedly takes diuretics at baseline, no history of CHF -Slow improvement with low-dose steroids, diuretics and supportive care  Assessment & Plan:   Acute hypoxic respiratory failure  -History of asthma admitted with exacerbation, symptoms more concerning for vocal cord dysfunction,/GERD causing upper airway wheeze, anxiety  -Symptomatic with cough/congestion/wheezing for 5 weeks prior to admission, Non smoker -Appreciate pulmonary consult, suspected to be less likely asthma, more upper airway wheeze possibly from GERD or VCD -Echo with preserved EF, diastolic dysfunction -Slowly improving, still continues to have significant symptom burden -Continue low-dose prednisone, will taper this slowly over 4 to 5 days -Continue low-dose diuretic as well -Wean off oxygen as tolerated -Appreciate pulmonary input, close follow-up and PFTs recommended -She would also benefit from weight loss and a sleep study as outpatient -Ambulate, out of bed, PT eval  Acute on chronic diastolic CHF  -Improving with diuresis  -Is euvolemic now, continue low-dose Lasix orally daily -Echocardiogram with preserved EF  GERD -Protonix daily, dose increased  Leukocytosis -Felt to be secondary to steroids, afebrile and nontoxic,  monitor  Diabetes mellitus/uncontrolled -Held 70/30 insulin, she was started on Lantus, pre-meal NovoLog and sliding scale due to worsening hyperglycemia in the setting of steroids, -Continue Lantus, increased dose and pre-meal NovoLog  Essential hypertension Continue home regimen  Dyslipidemia Continue statin  Anxiety. Patient had a anxiety attack and needed some IV Ativan yesterday. Later in the night patient become unresponsive and a rapid response was called. Currently will discontinue Ativan and monitor.  DVT prophylaxis: Lovenox Code Status: Full code Family Communication: No family at bedside Disposition Plan: Rehab in 1 to 2 days  Consultants:   Pulm   Procedures:   Antimicrobials:    Subjective: Still cough but still breathing heavy.  Unable to sleep last night due to cough although the patient actually had a respiratory event for unresponsiveness.  No nausea no vomiting.   Objective: Vitals:   08/29/18 0757 08/29/18 0819 08/29/18 1519 08/29/18 1701  BP:  138/71  137/79  Pulse:  93  92  Resp:      Temp:  (!) 97.4 F (36.3 C)  98 F (36.7 C)  TempSrc:  Oral  Oral  SpO2: 99% 98% 98% 97%  Weight:      Height:        Intake/Output Summary (Last 24 hours) at 08/29/2018 1942 Last data filed at 08/29/2018 0900 Gross per 24 hour  Intake 240 ml  Output -  Net 240 ml   Filed Weights   08/26/18 0416 08/27/18 0500 08/29/18 0500  Weight: 96.3 kg 94.7 kg 92 kg    Examination: Gen: Awake, Alert, Oriented X 3, is female, sitting up in bed, no distress HEENT: PERRLA, Neck supple, no JVD Lungs: Poor air movement, faint upper expiratory wheeze noted CVS: RRR,No Gallops,Rubs or new Murmurs  Abd: soft, Non tender, non distended, BS present Extremities: No edema Psychiatry: Judgement and insight appear normal. Mood & affect appropriate.     Data Reviewed:   CBC: Recent Labs  Lab 08/24/18 1225 08/25/18 0212 08/26/18 0344 08/27/18 0227 08/29/18 0312   WBC 15.7* 16.6* 19.1* 18.2* 15.1*  NEUTROABS 13.0*  --   --   --   --   HGB 13.7 12.0 13.4 13.9 13.9  HCT 41.5 36.4 41.4 42.9 41.7  MCV 86.8 85.8 86.8 86.7 87.6  PLT 189 170 171 194 154   Basic Metabolic Panel: Recent Labs  Lab 08/24/18 1225 08/25/18 0212 08/26/18 0344 08/27/18 0227 08/29/18 0312  NA 137 136 138 133* 134*  K 4.2 4.3 2.9* 3.7 4.6  CL 101 102 94* 92* 92*  CO2 25 23 32 27 29  GLUCOSE 205* 353* 61* 233* 126*  BUN 28* 23 32* 41* 38*  CREATININE 1.19* 1.01* 1.25* 1.44* 1.13*  CALCIUM 8.8* 8.6* 9.2 9.2 9.4   GFR: Estimated Creatinine Clearance: 44.7 mL/min (A) (by C-G formula based on SCr of 1.13 mg/dL (H)). Liver Function Tests: No results for input(s): AST, ALT, ALKPHOS, BILITOT, PROT, ALBUMIN in the last 168 hours. No results for input(s): LIPASE, AMYLASE in the last 168 hours. No results for input(s): AMMONIA in the last 168 hours. Coagulation Profile: No results for input(s): INR, PROTIME in the last 168 hours. Cardiac Enzymes: No results for input(s): CKTOTAL, CKMB, CKMBINDEX, TROPONINI in the last 168 hours. BNP (last 3 results) No results for input(s): PROBNP in the last 8760 hours. HbA1C: No results for input(s): HGBA1C in the last 72 hours. CBG: Recent Labs  Lab 08/28/18 2112 08/28/18 2252 08/29/18 0818 08/29/18 1136 08/29/18 1700  GLUCAP 159* 248* 103* 194* 318*   Lipid Profile: No results for input(s): CHOL, HDL, LDLCALC, TRIG, CHOLHDL, LDLDIRECT in the last 72 hours. Thyroid Function Tests: No results for input(s): TSH, T4TOTAL, FREET4, T3FREE, THYROIDAB in the last 72 hours. Anemia Panel: No results for input(s): VITAMINB12, FOLATE, FERRITIN, TIBC, IRON, RETICCTPCT in the last 72 hours. Urine analysis:    Component Value Date/Time   COLORURINE YELLOW 09/02/2013 1340   APPEARANCEUR CLEAR 09/02/2013 1340   LABSPEC 1.029 09/02/2013 1340   PHURINE 6.0 09/02/2013 1340   GLUCOSEU NEGATIVE 09/02/2013 1340   GLUCOSEU NEGATIVE 10/30/2009  1006   HGBUR NEGATIVE 09/02/2013 1340   BILIRUBINUR NEGATIVE 09/02/2013 1340   KETONESUR NEGATIVE 09/02/2013 1340   PROTEINUR 30 (A) 09/02/2013 1340   UROBILINOGEN 0.2 09/02/2013 1340   NITRITE NEGATIVE 09/02/2013 1340   LEUKOCYTESUR NEGATIVE 09/02/2013 1340   Sepsis Labs: @LABRCNTIP (procalcitonin:4,lacticidven:4)  ) Recent Results (from the past 240 hour(s))  Respiratory Panel by PCR     Status: None   Collection Time: 08/24/18  7:30 PM  Result Value Ref Range Status   Adenovirus NOT DETECTED NOT DETECTED Final   Coronavirus 229E NOT DETECTED NOT DETECTED Final   Coronavirus HKU1 NOT DETECTED NOT DETECTED Final   Coronavirus NL63 NOT DETECTED NOT DETECTED Final   Coronavirus OC43 NOT DETECTED NOT DETECTED Final   Metapneumovirus NOT DETECTED NOT DETECTED Final   Rhinovirus / Enterovirus NOT DETECTED NOT DETECTED Final   Influenza A NOT DETECTED NOT DETECTED Final   Influenza B NOT DETECTED NOT DETECTED Final   Parainfluenza Virus 1 NOT DETECTED NOT DETECTED Final   Parainfluenza Virus 2 NOT DETECTED NOT DETECTED Final   Parainfluenza Virus 3 NOT DETECTED NOT DETECTED Final   Parainfluenza Virus 4 NOT DETECTED  NOT DETECTED Final   Respiratory Syncytial Virus NOT DETECTED NOT DETECTED Final   Bordetella pertussis NOT DETECTED NOT DETECTED Final   Chlamydophila pneumoniae NOT DETECTED NOT DETECTED Final   Mycoplasma pneumoniae NOT DETECTED NOT DETECTED Final         Radiology Studies: No results found.      Scheduled Meds: . aspirin EC  81 mg Oral Daily  . benzonatate  100 mg Oral TID  . chlorpheniramine-HYDROcodone  5 mL Oral Q12H  . enoxaparin (LOVENOX) injection  40 mg Subcutaneous Q24H  . ferrous sulfate  325 mg Oral Q breakfast  . furosemide  40 mg Oral Daily  . gabapentin  300 mg Oral BID  . insulin aspart  0-20 Units Subcutaneous TID WC  . insulin aspart  0-5 Units Subcutaneous QHS  . insulin aspart  8 Units Subcutaneous TID WC  . insulin glargine  75  Units Subcutaneous QHS  . ipratropium-albuterol  3 mL Nebulization Q6H  . mometasone-formoterol  2 puff Inhalation BID  . montelukast  10 mg Oral QHS  . pantoprazole  80 mg Oral BID  . potassium chloride  40 mEq Oral Daily  . predniSONE  20 mg Oral Q breakfast  . rOPINIRole  4 mg Oral QHS  . sertraline  100 mg Oral Daily  . simvastatin  40 mg Oral QHS  . sodium chloride flush  3 mL Intravenous Q12H  . sodium chloride flush  3 mL Intravenous Q12H  . traZODone  100 mg Oral QHS   Continuous Infusions: . sodium chloride       LOS: 5 days    Time spent: 68min    Author:  Berle Mull, MD [facility] 08/29/2018  If 7PM-7AM, please contact night-coverage To reach On-call, see [email or web address]   08/29/2018, 7:42 PM

## 2018-08-30 LAB — GLUCOSE, CAPILLARY
Glucose-Capillary: 105 mg/dL — ABNORMAL HIGH (ref 70–99)
Glucose-Capillary: 41 mg/dL — CL (ref 70–99)
Glucose-Capillary: 307 mg/dL — ABNORMAL HIGH (ref 70–99)

## 2018-08-30 MED ORDER — ASPIRIN 81 MG PO TBEC: 81.0000 mg | DELAYED_RELEASE_TABLET | Freq: Every day | ORAL | 0 refills | Status: DC

## 2018-08-30 MED ORDER — BENZONATATE 100 MG PO CAPS: 100.0000 mg | ORAL_CAPSULE | Freq: Three times a day (TID) | ORAL | 0 refills | Status: DC

## 2018-08-30 MED ORDER — POTASSIUM CHLORIDE CRYS ER 20 MEQ PO TBCR: 20.0000 meq | EXTENDED_RELEASE_TABLET | Freq: Every day | ORAL | 0 refills | Status: DC

## 2018-08-30 MED ORDER — PANTOPRAZOLE SODIUM 40 MG PO TBEC: 40.0000 mg | DELAYED_RELEASE_TABLET | Freq: Two times a day (BID) | ORAL | 0 refills | Status: DC

## 2018-08-30 MED ORDER — MENTHOL 3 MG MT LOZG: 1.0000 | LOZENGE | OROMUCOSAL | 12 refills | Status: DC | PRN

## 2018-08-30 MED ORDER — GUAIFENESIN ER 600 MG PO TB12
600.0000 mg | ORAL_TABLET | Freq: Two times a day (BID) | ORAL | Status: DC
  Administered 2018-08-30: 600 mg via ORAL
  Filled 2018-08-30: qty 1

## 2018-08-30 MED ORDER — MENTHOL 3 MG MT LOZG: 1.0000 | LOZENGE | OROMUCOSAL | Status: DC | PRN

## 2018-08-30 MED ORDER — PREDNISONE 10 MG PO TABS: ORAL_TABLET | ORAL | 0 refills | Status: DC

## 2018-08-30 MED ORDER — HYDROCOD POLST-CPM POLST ER 10-8 MG/5ML PO SUER: 5.0000 mL | Freq: Two times a day (BID) | ORAL | 0 refills | Status: DC

## 2018-08-30 MED ORDER — GUAIFENESIN ER 600 MG PO TB12: 600.0000 mg | ORAL_TABLET | Freq: Two times a day (BID) | ORAL | 0 refills | Status: DC

## 2018-08-30 MED ORDER — TRAMADOL HCL 50 MG PO TABS: ORAL_TABLET | ORAL | 0 refills | Status: DC

## 2018-08-30 MED ORDER — IPRATROPIUM-ALBUTEROL 0.5-2.5 (3) MG/3ML IN SOLN: 3.0000 mL | Freq: Two times a day (BID) | RESPIRATORY_TRACT | Status: DC

## 2018-08-30 MED ORDER — IPRATROPIUM-ALBUTEROL 0.5-2.5 (3) MG/3ML IN SOLN: 3.0000 mL | Freq: Four times a day (QID) | RESPIRATORY_TRACT | 0 refills | Status: DC | PRN

## 2018-08-30 MED ORDER — POLYETHYLENE GLYCOL 3350 17 G PO PACK: 17.0000 g | PACK | Freq: Every day | ORAL | 0 refills | Status: DC | PRN

## 2018-08-30 NOTE — Progress Notes (Signed)
..  Patient given all discharge paper work. All pages reviewed and subsequent questions answered. Patients telemetry and peripheral IV's discontinued without complications. Patients belongings returned. Report called to [place] at [facility] facility. Awaiting patients niece for transport.   English as a second language teacher

## 2018-08-30 NOTE — Discharge Summary (Signed)
[facility] Discharge Summary   Patient: Carrie White   PCP: Rodena Medin, MD (Inactive) DOB: [birth year]   Date of admission: 08/24/2018   Date of discharge:  08/30/2018    Discharge Diagnoses:  Principal diagnosis Chronic hypoxic respiratory failure secondary to COPD exacerbation Principal Problem:   Chronic obstructive asthma with acute exacerbation of asthma ([place]) Active Problems:   DM (diabetes mellitus), type 2, uncontrolled ([place])   Type 2 diabetes mellitus with hyperlipidemia ([place])   Essential hypertension   Restless leg syndrome   Admitted From: Home Disposition: SNF  Recommendations for Outpatient Follow-up:  1. Please follow-up with PCP in 1 week. 2. Please follow-up with pulmonary as recommended. 3. Patient is requesting referral to cardiology, Dr. Haroldine Laws, patient requested to discuss with PCP regarding referral.  Follow-up Information    [facility]. Go on 09/10/2018.   Specialty:  Pulmonology Why:  Your appointment is on 09/10/2018 with Derl Barrow, NP at 2 PM for your hospital follo-up and further testing.  If you need to reschedule this appointment please call our office.  Thank you.  Contact information: [address] [place] [address] [phone]       Rodena Medin, MD. Schedule an appointment as soon as possible for a visit in 1 week(s).   Specialty:  Internal Medicine Contact information: [address] [place] [phone]          Diet recommendation: Heart healthy carb modified diet.  Activity: The patient is advised to gradually reintroduce usual activities.  Discharge Condition: good  Code Status: Full code  History of present illness: As per the H and P dictated on admission, "Carrie White is a 78 y.o. female with medical history significant of Beatties type II, hypertension, and severe persistent asthma presents the emergency department with  persistent wheezing and recurrent bronchitis.  She has been treated for about a month by her outpatient physician with antibiotics and steroids and has had no relief.  She continues to be tachypneic and very short of breath.  She has failed outpatient therapy and referred to me for further evaluation and management. ED Course: Chest x-ray does not show pneumonia, CT scan is okay, arterial blood gas is noted.  He has a pulmonologist, Dr. for at [facility].  He was seen by pulmonary who reported that she had no symptoms of asthma until 2 years ago when she was diagnosed with asthma and recurrent bronchitis.  His evaluation was quite interesting and recommended that the patient probably has gastroesophageal reflux disease with recurrent wheezing due to acid reflux bathing her vocal cords.  After [place] also felt that she had significant anxiety but she is able to speak in full sentences.  Does not appear to have a fixed upper airway abnormality.  Please see his consult for recommendations."  Hospital Course:  Summary of her active problems in the hospital is as following. Acute hypoxic respiratory failure  -History of asthma admitted with exacerbation, symptoms more concerning for vocal cord dysfunction,/GERD causing upper airway wheeze, anxiety  -Symptomatic with cough/congestion/wheezing for 5 weeks prior to admission, Non smoker -Appreciate pulmonary consult, suspected to be less likely asthma, more upper airway wheeze possibly from GERD or VCD -Echo with preserved EF, diastolic dysfunction -Slowly improving, still continues to have significant symptom burden -Continue low-dose prednisone, will taper this slowly over 4 to 5 days -Continue low-dose diuretic as well -Wean off oxygen as tolerated -  Appreciate pulmonary input, close follow-up and PFTs recommended -She would also benefit from weight loss and a sleep study as outpatient -Ambulate, out of bed, PT eval  Acute on  chronic diastolic CHF  -Improving with diuresis  -Is euvolemic now, continue low-dose Lasix orally daily -Echocardiogram with preserved EF  GERD -Protonix daily, dose increased  Leukocytosis -Felt to be secondary to steroids, afebrile and nontoxic, monitor  Diabetes mellitus/uncontrolled -Held 70/30 insulin, she was started on Lantus, pre-meal NovoLog and sliding scale due to worsening hyperglycemia in the setting of steroids, -Continue home regimen on discharge.  Essential hypertension Continue home regimen  Dyslipidemia Continue statin  Anxiety. Patient had a anxiety attack and needed some IV Ativan yesterday. Later in the night patient become unresponsive and a rapid response was called. No further events of unresponsiveness and no further anxiety episode as well.  Morbid obesity. Body mass index is 36.36 kg/m.  Patient dietary consultation.  Patient was seen by physical therapy, who recommended SNF, which was arranged by Education officer, museum. On the day of the discharge the patient's vitals were stable , and no other acute medical condition were reported by patient. the patient was felt safe to be discharge at SNF with tehrapy.  Consultants: PCCM  Procedures: Echocardiogram   DISCHARGE MEDICATION: Allergies as of 08/30/2018   No Known Allergies     Medication List    STOP taking these medications   hydrochlorothiazide 25 MG tablet Commonly known as:  HYDRODIURIL   lisinopril 10 MG tablet Commonly known as:  PRINIVIL,ZESTRIL     TAKE these medications   ACCU-CHEK AVIVA PLUS w/Device Kit Use to check blood sugar daily. Dx code: 250.61   ACCU-CHEK SOFTCLIX LANCETS lancets Use to test blood sugar 2 times daily as instructed. Dx code: 250.61   albuterol 108 (90 Base) MCG/ACT inhaler Commonly known as:  PROVENTIL HFA;VENTOLIN HFA Inhale 1 puff into the lungs every 6 (six) hours as needed for wheezing or shortness of breath.   aspirin 81 MG EC tablet Take 1  tablet (81 mg total) by mouth daily. Start taking on:  August 31, 2018 What changed:    medication strength  how much to take  when to take this   BD ULTRA-FINE PEN NEEDLES 29G X 12.7MM Misc Generic drug:  Insulin Pen Needle USE DAILY   BD ULTRA-FINE PEN NEEDLES 29G X 12.7MM Misc Generic drug:  Insulin Pen Needle USE DAILY   benzonatate 100 MG capsule Commonly known as:  TESSALON Take 1 capsule (100 mg total) by mouth 3 (three) times daily.   chlorpheniramine-HYDROcodone 10-8 MG/5ML Suer Commonly known as:  TUSSIONEX Take 5 mLs by mouth 2 (two) times daily.   ferrous sulfate 325 (65 FE) MG tablet Take 325 mg by mouth daily with breakfast.   furosemide 20 MG tablet Commonly known as:  LASIX Take 40 mg by mouth daily.   gabapentin 100 MG capsule Commonly known as:  NEURONTIN Take 300 mg by mouth 2 (two) times daily.   glimepiride 4 MG tablet Commonly known as:  AMARYL Take 4 mg by mouth 2 (two) times daily.   Glucose Blood Disk Commonly known as:  BAYER BREEZE 2 TEST TEST 2 TIMES PER DAY   glucose blood test strip Commonly known as:  ACCU-CHEK AVIVA PLUS Use to test blood sugar 2 times daily as instructed. Dx code: 250.61   guaiFENesin 600 MG 12 hr tablet Commonly known as:  MUCINEX Take 1 tablet (600 mg total) by mouth 2 (two)  times daily.   HUMALOG MIX 75/25 KWIKPEN (75-25) 100 UNIT/ML Kwikpen Generic drug:  Insulin Lispro Prot & Lispro Inject 50 Units into the skin 2 (two) times daily.   ipratropium-albuterol 0.5-2.5 (3) MG/3ML Soln Commonly known as:  DUONEB Take 3 mLs by nebulization every 6 (six) hours as needed.   meloxicam 7.5 MG tablet Commonly known as:  MOBIC Take 7.5 mg by mouth daily.   menthol-cetylpyridinium 3 MG lozenge Commonly known as:  CEPACOL Take 1 lozenge (3 mg total) by mouth as needed for sore throat.   metFORMIN 500 MG 24 hr tablet Commonly known as:  GLUCOPHAGE-XR Take 1,000 mg by mouth at bedtime.   montelukast 10 MG  tablet Commonly known as:  SINGULAIR Take 10 mg by mouth at bedtime.   omeprazole 40 MG capsule Commonly known as:  PRILOSEC Take 1 capsule (40 mg total) by mouth daily. PT NEEDS NEW PCP FOR FURTHER REFILLS DR [name] RETIRED What changed:  additional instructions   pantoprazole 40 MG tablet Commonly known as:  PROTONIX Take 1 tablet (40 mg total) by mouth 2 (two) times daily before a meal.   polyethylene glycol packet Commonly known as:  MIRALAX / GLYCOLAX Take 17 g by mouth daily as needed for mild constipation.   potassium chloride SA 20 MEQ tablet Commonly known as:  K-DUR,KLOR-CON Take 1 tablet (20 mEq total) by mouth daily. Start taking on:  August 31, 2018   predniSONE 10 MG tablet Commonly known as:  DELTASONE Take 6m daily for 3days,Take 132mdaily for 3days, then stop What changed:    how much to take  how to take this  when to take this  additional instructions   rOPINIRole 4 MG tablet Commonly known as:  REQUIP Take 4 mg by mouth at bedtime.   sertraline 50 MG tablet Commonly known as:  ZOLOFT Take 1 tablet (50 mg total) by mouth daily. What changed:  how much to take   simvastatin 40 MG tablet Commonly known as:  ZOCOR Take 1 tablet (40 mg total) by mouth at bedtime. PT NEEDS NEW PCP FOR FURTHER REFILLS DR [name] RETIRED What changed:  additional instructions   SYMBICORT 160-4.5 MCG/ACT inhaler Generic drug:  budesonide-formoterol Inhale 2 puffs into the lungs 2 (two) times daily.   traMADol 50 MG tablet Commonly known as:  ULTRAM Take two tablets by mouth every 6 hours as needed for pain   traZODone 100 MG tablet Commonly known as:  DESYREL Take 100-200 mg by mouth at bedtime.   TRULICITY 0.9.89GQJ/1.9ERopn Generic drug:  Dulaglutide Inject 0.75 mg into the skin once a week.      No Known Allergies Discharge Instructions    Diet - low sodium heart healthy   Complete by:  As directed    Discharge instructions   Complete by:  As  directed    It is important that you read the given instructions as well as go over your medication list with RN to help you understand your care after this hospitalization.  Discharge Instructions: Please follow-up with PCP in 1-2 weeks  Please request your primary care physician to go over all Hospital Tests and Procedure/Radiological results at the follow up. Please get all Hospital records sent to your PCP by signing hospital release before you go home.   Do not take more than prescribed Pain, Sleep and Anxiety Medications. You were cared for by a hospitalist during your hospital stay. If you have any questions about your discharge  medications or the care you received while you were in the hospital after you are discharged, you can call the unit '@UNIT' @ you were admitted to and ask to speak with the hospitalist on call if the hospitalist that took care of you is not available.  Once you are discharged, your primary care physician will handle any further medical issues. Please note that NO REFILLS for any discharge medications will be authorized once you are discharged, as it is imperative that you return to your primary care physician (or establish a relationship with a primary care physician if you do not have one) for your aftercare needs so that they can reassess your need for medications and monitor your lab values. You Must read complete instructions/literature along with all the possible adverse reactions/side effects for all the Medicines you take and that have been prescribed to you. Take any new Medicines after you have completely understood and accept all the possible adverse reactions/side effects. Wear Seat belts while driving. If you have smoked or chewed Tobacco in the last 2 yrs please stop smoking and/or stop any Recreational drug use.  If you drink alcohol, please moderate the use and do not drive, operating heavy machinery, perform activities at heights, swimming or  participation in water activities or provide baby sitting services under influence.   Increase activity slowly   Complete by:  As directed      Discharge Exam: Filed Weights   08/27/18 0500 08/29/18 0500 08/30/18 0635  Weight: 94.7 kg 92 kg 90.2 kg   Vitals:   08/30/18 0741 08/30/18 0756  BP:  (!) 141/77  Pulse:  80  Resp:    Temp:  (!) 97.3 F (36.3 C)  SpO2: 93% 94%   General: Appear in mild distress, no Rash; Oral Mucosa moist. Cardiovascular: S1 and S2 Present, no Murmur, no JVD Respiratory: Bilateral Air entry present and Clear to Auscultation, no Crackles, Occasional wheezes Abdomen: Bowel Sound present, Soft and no tenderness Extremities: no Pedal edema, no calf tenderness Neurology: Grossly no focal neuro deficit.  The results of significant diagnostics from this hospitalization (including imaging, microbiology, ancillary and laboratory) are listed below for reference.    Significant Diagnostic Studies: Dg Chest 2 View  Result Date: 08/24/2018 CLINICAL DATA:  Cough and cold symptoms for several weeks EXAM: CHEST - 2 VIEW COMPARISON:  08/13/2018 FINDINGS: Cardiac shadow is mildly enlarged. The lungs are clear bilaterally. No sizable effusion is seen. Degenerative changes of the thoracic spine are noted. IMPRESSION: No active cardiopulmonary disease. Electronically Signed   By: Inez Catalina M.D.   On: 08/24/2018 13:14   Ct Angio Chest Pe W And/or Wo Contrast  Result Date: 08/24/2018 CLINICAL DATA:  Ct chest pe, Pt reports cold symptoms, cough, generalized weakness for 5 weeks per pt. Pt seen by PCP and treated with steroids and abx without relief EXAM: CT ANGIOGRAPHY CHEST WITH CONTRAST TECHNIQUE: Multidetector CT imaging of the chest was performed using the standard protocol during bolus administration of intravenous contrast. Multiplanar CT image reconstructions and MIPs were obtained to evaluate the vascular anatomy. CONTRAST:  164m ISOVUE-370 IOPAMIDOL (ISOVUE-370)  INJECTION 76% COMPARISON:  None. FINDINGS: Cardiovascular: Heart size upper limits normal. No pericardial effusion. RV is nondilated. Mildly dilated central pulmonary arteries. Satisfactory opacification of pulmonary arteries noted, and there is no evidence of pulmonary emboli. Scattered coronary calcifications. Adequate contrast opacification of the thoracic aorta with no evidence of dissection, aneurysm, or stenosis. There is classic 3-vessel brachiocephalic arch anatomy without proximal stenosis.  Scattered calcified plaque in the distal arch and descending thoracic segment. Mediastinum/Nodes: No hilar or mediastinal adenopathy. Lungs/Pleura: No pleural effusion. No pneumothorax. Mild dependent atelectasis posteriorly in both lungs. No nodule or focal infiltrate. Upper Abdomen: No acute findings. Musculoskeletal: Anterior vertebral endplate spurring at multiple levels in the mid and lower thoracic spine. No fracture or worrisome bone lesion. Review of the MIP images confirms the above findings. IMPRESSION: 1. Negative for acute PE or thoracic aortic dissection. 2. Atherosclerosis, including aortic and coronary artery disease. Please note that although the presence of coronary artery calcium documents the presence of coronary artery disease, the severity of this disease and any potential stenosis cannot be assessed on this non-gated CT examination. Assessment for potential risk factor modification, dietary therapy or pharmacologic therapy may be warranted, if clinically indicated. Electronically Signed   By: Lucrezia Europe M.D.   On: 08/24/2018 14:47    Microbiology: Recent Results (from the past 240 hour(s))  Respiratory Panel by PCR     Status: None   Collection Time: 08/24/18  7:30 PM  Result Value Ref Range Status   Adenovirus NOT DETECTED NOT DETECTED Final   Coronavirus 229E NOT DETECTED NOT DETECTED Final   Coronavirus HKU1 NOT DETECTED NOT DETECTED Final   Coronavirus NL63 NOT DETECTED NOT DETECTED  Final   Coronavirus OC43 NOT DETECTED NOT DETECTED Final   Metapneumovirus NOT DETECTED NOT DETECTED Final   Rhinovirus / Enterovirus NOT DETECTED NOT DETECTED Final   Influenza A NOT DETECTED NOT DETECTED Final   Influenza B NOT DETECTED NOT DETECTED Final   Parainfluenza Virus 1 NOT DETECTED NOT DETECTED Final   Parainfluenza Virus 2 NOT DETECTED NOT DETECTED Final   Parainfluenza Virus 3 NOT DETECTED NOT DETECTED Final   Parainfluenza Virus 4 NOT DETECTED NOT DETECTED Final   Respiratory Syncytial Virus NOT DETECTED NOT DETECTED Final   Bordetella pertussis NOT DETECTED NOT DETECTED Final   Chlamydophila pneumoniae NOT DETECTED NOT DETECTED Final   Mycoplasma pneumoniae NOT DETECTED NOT DETECTED Final     Labs: CBC: Recent Labs  Lab 08/24/18 1225 08/25/18 0212 08/26/18 0344 08/27/18 0227 08/29/18 0312  WBC 15.7* 16.6* 19.1* 18.2* 15.1*  NEUTROABS 13.0*  --   --   --   --   HGB 13.7 12.0 13.4 13.9 13.9  HCT 41.5 36.4 41.4 42.9 41.7  MCV 86.8 85.8 86.8 86.7 87.6  PLT 189 170 171 194 076   Basic Metabolic Panel: Recent Labs  Lab 08/24/18 1225 08/25/18 0212 08/26/18 0344 08/27/18 0227 08/29/18 0312  NA 137 136 138 133* 134*  K 4.2 4.3 2.9* 3.7 4.6  CL 101 102 94* 92* 92*  CO2 25 23 32 27 29  GLUCOSE 205* 353* 61* 233* 126*  BUN 28* 23 32* 41* 38*  CREATININE 1.19* 1.01* 1.25* 1.44* 1.13*  CALCIUM 8.8* 8.6* 9.2 9.2 9.4   Liver Function Tests: No results for input(s): AST, ALT, ALKPHOS, BILITOT, PROT, ALBUMIN in the last 168 hours. No results for input(s): LIPASE, AMYLASE in the last 168 hours. No results for input(s): AMMONIA in the last 168 hours. Cardiac Enzymes: No results for input(s): CKTOTAL, CKMB, CKMBINDEX, TROPONINI in the last 168 hours. BNP (last 3 results) Recent Labs    08/24/18 1247 08/24/18 1931  BNP 18.0 22.2   CBG: Recent Labs  Lab 08/29/18 1700 08/29/18 2109 08/30/18 0755 08/30/18 0821 08/30/18 1137  GLUCAP 318* 204* 41* 105* 307*     Time spent: 35 minutes  Signed:  Berle Mull  [facility]  08/30/2018

## 2018-08-30 NOTE — Clinical Social Work Placement (Signed)
Nurse to call report to [phone], Room 101A     CLINICAL SOCIAL WORK PLACEMENT  NOTE  Date:  08/30/2018  Patient Details  Name: Carrie White MRN: [id number] Date of Birth: August 27, 1940  Clinical Social Work is seeking post-discharge placement for this patient at the [place] level of care (*CSW will initial, date and re-position this form in  chart as items are completed):  Yes   Patient/family provided with [facility] Work Department's list of facilities offering this level of care within the geographic area requested by the patient (or if unable, by the patient's family).  Yes   Patient/family informed of their freedom to choose among providers that offer the needed level of care, that participate in Medicare, Medicaid or managed care program needed by the patient, have an available bed and are willing to accept the patient.  Yes   Patient/family informed of [facility]'s ownership interest in [facility] and [facility], as well as of the fact that they are under no obligation to receive care at these facilities.  PASRR submitted to EDS on       PASRR number received on       Existing PASRR number confirmed on       FL2 transmitted to all facilities in geographic area requested by pt/family on       FL2 transmitted to all facilities within larger geographic area on       Patient informed that his/her managed care company has contracts with or will negotiate with certain facilities, including the following:        Yes   Patient/family informed of bed offers received.  Patient chooses bed at [place], [place]     Physician recommends and patient chooses bed at      Patient to be transferred to [place] [facility] on 08/30/18.  Patient to be transferred to facility by Family car     Patient family notified on 08/30/18 of transfer.  Name of family member notified:  [name]     PHYSICIAN       Additional Comment:     _______________________________________________ Geralynn Ochs, LCSW 08/30/2018, 1:36 PM

## 2018-08-30 NOTE — Progress Notes (Signed)
Inpatient Diabetes Program Recommendations  AACE/ADA: New Consensus Statement on Inpatient Glycemic Control (2015)  Target Ranges:  Prepandial:   less than 140 mg/dL      Peak postprandial:   less than 180 mg/dL (1-2 hours)      Critically ill patients:  140 - 180 mg/dL   Lab Results  Component Value Date   GLUCAP 105 (H) 08/30/2018   HGBA1C 10.1 (H) 08/25/2018    Review of Glycemic Control  Inpatient Diabetes Program Recommendations:   Noted steroids decreased and patient had low of 46 this am. Patient received 23 units yesterday of Novolog @ 1740 and 2 units @ hs. Spoke with Mervin Kung RN and discussed hypoglycemia protocol. Will follow during hospitalization.  Thank you, Nani Gasser. Jayshaun Phillips, RN, MSN, CDE  Diabetes Coordinator Inpatient Glycemic Control Team Team Pager [phone] (8am-5pm) 08/30/2018 11:23 AM

## 2018-08-30 NOTE — Progress Notes (Signed)
Physical Therapy Treatment Patient Details Name: Carrie White MRN: [id number] DOB: 03/26/1941 Today's Date: 08/30/2018    History of Present Illness 78yo female with severe persistent asthma, presenting with ongoing wheezing and bronchitis. Admitted due to chronic obstructive asthma. PMH DM, fibromyalgia, HTN, hx TKR     PT Comments    Patient progressing well with therapy at this time. Improving ambulation distance and now able to  trial without AD with hands on guarding for safety. No DOE, SpO2 >94% on RA throughout session with activity.   Follow Up Recommendations  SNF     Equipment Recommendations  (TBD next venue)    Recommendations for Other Services       Precautions / Restrictions Restrictions Weight Bearing Restrictions: No    Mobility  Bed Mobility Overal bed mobility: Independent                Transfers Overall transfer level: Needs assistance Equipment used: None Transfers: Sit to/from Stand              Ambulation/Gait Ambulation/Gait assistance: Min guard Gait Distance (Feet): 95 Feet Assistive device: None Gait Pattern/deviations: Step-to pattern Gait velocity: decreased   General Gait Details: unsteady but no overt LOB, contact guard for safety. no DOE, SpO2 WNL on RA.    Stairs             Wheelchair Mobility    Modified Rankin (Stroke Patients Only)       Balance Overall balance assessment: Needs assistance Sitting-balance support: Feet supported Sitting balance-Leahy Scale: Good                                      Cognition Arousal/Alertness: Awake/alert Behavior During Therapy: WFL for tasks assessed/performed Overall Cognitive Status: Within Functional Limits for tasks assessed                                        Exercises      General Comments        Pertinent Vitals/Pain Pain Assessment: No/denies pain    Home Living                      Prior  Function            PT Goals (current goals can now be found in the care plan section) Acute Rehab PT Goals Patient Stated Goal: get stronger  PT Goal Formulation: With patient Time For Goal Achievement: 09/11/18 Potential to Achieve Goals: Good Progress towards PT goals: Progressing toward goals    Frequency    Min 2X/week      PT Plan Current plan remains appropriate    Co-evaluation              AM-PAC PT "6 Clicks" Mobility   Outcome Measure  Help needed turning from your back to your side while in a flat bed without using bedrails?: A Little Help needed moving from lying on your back to sitting on the side of a flat bed without using bedrails?: A Little Help needed moving to and from a bed to a chair (including a wheelchair)?: A Little Help needed standing up from a chair using your arms (e.g., wheelchair or bedside chair)?: A Little Help needed to walk in hospital room?: A Little Help needed  climbing 3-5 steps with a railing? : A Lot 6 Click Score: 17    End of Session Equipment Utilized During Treatment: Gait belt Activity Tolerance: Patient tolerated treatment well Patient left: in bed;with bed alarm set;with call bell/phone within reach Nurse Communication: Mobility status PT Visit Diagnosis: Unsteadiness on feet (R26.81);Muscle weakness (generalized) (M62.81);Difficulty in walking, not elsewhere classified (R26.2)     Time: 1010-1025 PT Time Calculation (min) (ACUTE ONLY): 15 min  Charges:  $Gait Training: 8-22 mins                    Reinaldo Berber, PT, DPT Acute Rehabilitation Services Pager: [phone] Office: [phone]     Reinaldo Berber 08/30/2018, 10:29 AM

## 2018-09-04 NOTE — Telephone Encounter (Signed)
lmtcb X2 for pt to move appt to [facility] if pt is willing.

## 2018-09-10 ENCOUNTER — Encounter: Payer: Self-pay | Admitting: Pulmonary Disease

## 2018-09-10 ENCOUNTER — Ambulatory Visit: Payer: Medicare Other | Admitting: Pulmonary Disease

## 2018-09-10 ENCOUNTER — Inpatient Hospital Stay: Payer: Medicare Other | Admitting: Primary Care

## 2018-09-10 VITALS — BP 114/62 | HR 98 | Ht 62.5 in | Wt 216.0 lb

## 2018-09-10 DIAGNOSIS — J441 Chronic obstructive pulmonary disease with (acute) exacerbation: Secondary | ICD-10-CM | POA: Diagnosis not present

## 2018-09-10 DIAGNOSIS — R601 Generalized edema: Secondary | ICD-10-CM | POA: Insufficient documentation

## 2018-09-10 MED ORDER — ALBUTEROL SULFATE HFA 108 (90 BASE) MCG/ACT IN AERS: 1.0000 | INHALATION_SPRAY | Freq: Four times a day (QID) | RESPIRATORY_TRACT | 3 refills | Status: DC | PRN

## 2018-09-10 MED ORDER — AEROCHAMBER MV MISC: 0 refills | Status: DC

## 2018-09-10 NOTE — Progress Notes (Signed)
OK by me 

## 2018-09-10 NOTE — Assessment & Plan Note (Signed)
Assessment: 2-3+ lower extremity edema Elevated weights in office visit today Patient reports adherence to fluid pill Patient denies orthopnea  Plan: Referral to cardiology Follow-up with primary care regarding lower extremities Elevate lower extremities Purchase compression stockings

## 2018-09-10 NOTE — Progress Notes (Signed)
'@Patient'  ID: Carrie White, female    DOB: [birth year], 78 y.o.   MRN: [id number]  Chief Complaint  Patient presents with  . Hospitalization Follow-up    Asthma follow up     Referring provider: No ref. provider found  HPI:  78 year old female former smoker initially consulted with our office on 08/24/2018 for management of asthma  PMH: Hypertension, type 2 diabetes Smoker/ Smoking History: Former smoker.  Smoked socially.  Quit 1970 Maintenance: Symbicort 160 Pt of: Dr. Lake Bells  Recent [facility] Encounters:   Initial consult-08/24/2018-[facility] in hospital  09/10/2018  - Visit   78 year old female former smoker (smokes socially stopped in 1970) presenting to our office today for hospital follow-up.  Patient was initially consulted with our practice while inpatient.  Patient after being discharged from the hospital went to a nursing home she was just discharged from the nursing home a few days ago.  Patient is currently on a prednisone taper from the nursing home which she believes is due to the fact that she was having wheezing.  Patient reports she has not had to use her albuterol nebulized medications since discharge.  Patient does not have a rescue inhaler at home.  Patient reports adherence to Symbicort 160.  It does not appear the patient is using a spacer with her inhaler.  Patient reports that she started having seasonal aspect to her breathing about 3 years ago.  Patient reports that she typically has repeated bronchitis-like episodes from December through February for the last 3 years.  Patient is currently on Singulair.  Patient denies any former allergy work-up.  Patient's CBC with differential when admitted to the hospital shows no eosinophils.  Not seen an IgE in patient's chart.    Tests:  08/24/2018- resp viral panel > negative  08/24/2018- CT angiogram chest images independently reviewed from August 24, 2018: No pulmonary embolism, no significant pulmonary  parenchymal abnormality noted, some coronary calcifications  08/25/2018-echocardiogram-LV ejection fraction 55 to 58%, grade 1 diastolic dysfunction, normal pulmonary artery systolic pressure  FENO:  No results found for: NITRICOXIDE  PFT: No flowsheet data found.  Imaging: Dg Chest 2 View  Result Date: 08/24/2018 CLINICAL DATA:  Cough and cold symptoms for several weeks EXAM: CHEST - 2 VIEW COMPARISON:  08/13/2018 FINDINGS: Cardiac shadow is mildly enlarged. The lungs are clear bilaterally. No sizable effusion is seen. Degenerative changes of the thoracic spine are noted. IMPRESSION: No active cardiopulmonary disease. Electronically Signed   By: Inez Catalina M.D.   On: 08/24/2018 13:14   Ct Angio Chest Pe W And/or Wo Contrast  Result Date: 08/24/2018 CLINICAL DATA:  Ct chest pe, Pt reports cold symptoms, cough, generalized weakness for 5 weeks per pt. Pt seen by PCP and treated with steroids and abx without relief EXAM: CT ANGIOGRAPHY CHEST WITH CONTRAST TECHNIQUE: Multidetector CT imaging of the chest was performed using the standard protocol during bolus administration of intravenous contrast. Multiplanar CT image reconstructions and MIPs were obtained to evaluate the vascular anatomy. CONTRAST:  159m ISOVUE-370 IOPAMIDOL (ISOVUE-370) INJECTION 76% COMPARISON:  None. FINDINGS: Cardiovascular: Heart size upper limits normal. No pericardial effusion. RV is nondilated. Mildly dilated central pulmonary arteries. Satisfactory opacification of pulmonary arteries noted, and there is no evidence of pulmonary emboli. Scattered coronary calcifications. Adequate contrast opacification of the thoracic aorta with no evidence of dissection, aneurysm, or stenosis. There is classic 3-vessel brachiocephalic arch anatomy without proximal stenosis. Scattered calcified plaque in the distal arch and descending thoracic segment. Mediastinum/Nodes: No  hilar or mediastinal adenopathy. Lungs/Pleura: No pleural effusion.  No pneumothorax. Mild dependent atelectasis posteriorly in both lungs. No nodule or focal infiltrate. Upper Abdomen: No acute findings. Musculoskeletal: Anterior vertebral endplate spurring at multiple levels in the mid and lower thoracic spine. No fracture or worrisome bone lesion. Review of the MIP images confirms the above findings. IMPRESSION: 1. Negative for acute PE or thoracic aortic dissection. 2. Atherosclerosis, including aortic and coronary artery disease. Please note that although the presence of coronary artery calcium documents the presence of coronary artery disease, the severity of this disease and any potential stenosis cannot be assessed on this non-gated CT examination. Assessment for potential risk factor modification, dietary therapy or pharmacologic therapy may be warranted, if clinically indicated. Electronically Signed   By: Lucrezia Europe M.D.   On: 08/24/2018 14:47      Specialty Problems      Pulmonary Problems   Chronic obstructive asthma with acute exacerbation of asthma ([place])      No Known Allergies  Immunization History  Administered Date(s) Administered  . Influenza Split 06/05/2013  . Influenza Whole 05/18/2009, 05/06/2010  . Influenza, High Dose Seasonal PF 05/28/2018  . Influenza-Unspecified 05/16/2011  . Pneumococcal Polysaccharide-23 06/10/2004, 10/22/2012  . Td 06/10/2004    Past Medical History:  Diagnosis Date  . Complication of anesthesia   . Depression   . Diabetes mellitus    type II- uncontrolled  . FIBROMYALGIA 05/07/2007  . Fibromyalgia   . GERD (gastroesophageal reflux disease)   . GOITER, MULTINODULAR 07/01/2010  . HYPERLIPIDEMIA 07/31/2008  . HYPERTENSION 08/01/2008  . INSOMNIA, CHRONIC 06/29/2009  . Intraductal papilloma of breast 07/12/2011  . OSTEOARTHRITIS 05/07/2007  . PONV (postoperative nausea and vomiting)   . RESTLESS LEG SYNDROME 05/07/2007    Tobacco History: Social History   Tobacco Use  Smoking Status Former Smoker   . Types: Cigarettes  . Last attempt to quit: 10/28/1968  . Years since quitting: 49.9  Smokeless Tobacco Never Used  Tobacco Comment   only smoke 1 pack per Month when she did smoke more socially 09/10/18   Counseling given: Yes Comment: only smoke 1 pack per Month when she did smoke more socially 09/10/18   Outpatient Encounter Medications as of 09/10/2018  Medication Sig  . ACCU-CHEK SOFTCLIX LANCETS lancets Use to test blood sugar 2 times daily as instructed. Dx code: 250.61  . albuterol (PROVENTIL HFA;VENTOLIN HFA) 108 (90 Base) MCG/ACT inhaler Inhale 1 puff into the lungs every 6 (six) hours as needed for wheezing or shortness of breath.  Marland Kitchen aspirin EC 81 MG EC tablet Take 1 tablet (81 mg total) by mouth daily.  . BD ULTRA-FINE PEN NEEDLES 29G X 12.7MM MISC USE DAILY  . BD ULTRA-FINE PEN NEEDLES 29G X 12.7MM MISC USE DAILY  . benzonatate (TESSALON) 100 MG capsule Take 1 capsule (100 mg total) by mouth 3 (three) times daily.  . Blood Glucose Monitoring Suppl (ACCU-CHEK AVIVA PLUS) W/DEVICE KIT Use to check blood sugar daily. Dx code: 250.61  . budesonide-formoterol (SYMBICORT) 160-4.5 MCG/ACT inhaler Inhale 2 puffs into the lungs 2 (two) times daily.  . chlorpheniramine-HYDROcodone (TUSSIONEX) 10-8 MG/5ML SUER Take 5 mLs by mouth 2 (two) times daily.  . Dulaglutide (TRULICITY) 4.31 VQ/0.0QQ SOPN Inject 0.75 mg into the skin once a week.  . ferrous sulfate 325 (65 FE) MG tablet Take 325 mg by mouth daily with breakfast.  . furosemide (LASIX) 20 MG tablet Take 40 mg by mouth daily.  Marland Kitchen gabapentin (NEURONTIN) 100  MG capsule Take 300 mg by mouth 2 (two) times daily.  Marland Kitchen glimepiride (AMARYL) 4 MG tablet Take 4 mg by mouth 2 (two) times daily.  Marland Kitchen glucose blood (ACCU-CHEK AVIVA PLUS) test strip Use to test blood sugar 2 times daily as instructed. Dx code: 250.61  . Glucose Blood (BAYER BREEZE 2 TEST) DISK TEST 2 TIMES PER DAY  . guaiFENesin (MUCINEX) 600 MG 12 hr tablet Take 1 tablet (600 mg  total) by mouth 2 (two) times daily.  . Insulin Lispro Prot & Lispro (HUMALOG MIX 75/25 KWIKPEN) (75-25) 100 UNIT/ML Kwikpen Inject 50 Units into the skin 2 (two) times daily.  Marland Kitchen ipratropium-albuterol (DUONEB) 0.5-2.5 (3) MG/3ML SOLN Take 3 mLs by nebulization every 6 (six) hours as needed.  . meloxicam (MOBIC) 7.5 MG tablet Take 7.5 mg by mouth daily.  Marland Kitchen menthol-cetylpyridinium (CEPACOL) 3 MG lozenge Take 1 lozenge (3 mg total) by mouth as needed for sore throat.  . metFORMIN (GLUCOPHAGE-XR) 500 MG 24 hr tablet Take 1,000 mg by mouth at bedtime.  . montelukast (SINGULAIR) 10 MG tablet Take 10 mg by mouth at bedtime.  Marland Kitchen omeprazole (PRILOSEC) 40 MG capsule Take 1 capsule (40 mg total) by mouth daily. PT NEEDS NEW PCP FOR FURTHER REFILLS DR [name] RETIRED (Patient taking differently: Take 40 mg by mouth daily. )  . polyethylene glycol (MIRALAX / GLYCOLAX) packet Take 17 g by mouth daily as needed for mild constipation.  . potassium chloride SA (K-DUR,KLOR-CON) 20 MEQ tablet Take 1 tablet (20 mEq total) by mouth daily.  . predniSONE (DELTASONE) 10 MG tablet Take 44m daily for 3days,Take 125mdaily for 3days, then stop  . rOPINIRole (REQUIP) 4 MG tablet Take 4 mg by mouth at bedtime.   . sertraline (ZOLOFT) 50 MG tablet Take 1 tablet (50 mg total) by mouth daily. (Patient taking differently: Take 100 mg by mouth daily. )  . simvastatin (ZOCOR) 40 MG tablet Take 1 tablet (40 mg total) by mouth at bedtime. PT NEEDS NEW PCP FOR FURTHER REFILLS DR [name] RETIRED (Patient taking differently: Take 40 mg by mouth at bedtime. )  . traMADol (ULTRAM) 50 MG tablet Take two tablets by mouth every 6 hours as needed for pain  . traZODone (DESYREL) 100 MG tablet Take 100-200 mg by mouth at bedtime.  . [DISCONTINUED] albuterol (PROVENTIL HFA;VENTOLIN HFA) 108 (90 BASE) MCG/ACT inhaler Inhale 1 puff into the lungs every 6 (six) hours as needed for wheezing or shortness of breath.  . Spacer/Aero-Holding Chambers  (AEROCHAMBER MV) inhaler Use as instructed  . [DISCONTINUED] pantoprazole (PROTONIX) 40 MG tablet Take 1 tablet (40 mg total) by mouth 2 (two) times daily before a meal. (Patient not taking: Reported on 09/10/2018)   No facility-administered encounter medications on file as of 09/10/2018.      Review of Systems  Review of Systems  Constitutional: Negative for chills, fatigue, fever and unexpected weight change.  HENT: Negative for congestion, ear pain, postnasal drip, sinus pressure and sinus pain.   Respiratory: Negative for cough, chest tightness, shortness of breath and wheezing.   Cardiovascular: Negative for chest pain and palpitations.  Musculoskeletal: Negative for arthralgias.  Skin: Negative for color change.  Allergic/Immunologic: Negative for environmental allergies and food allergies.  Neurological: Negative for dizziness, light-headedness and headaches.  Psychiatric/Behavioral: Positive for confusion (Confusion regarding medications). Negative for dysphoric mood. The patient is not nervous/anxious.   All other systems reviewed and are negative.    Physical Exam  BP 114/62 (BP Location: Left  Arm, Cuff Size: Large)   Pulse 98   Ht 5' 2.5" (1.588 m)   Wt 216 lb (98 kg)   SpO2 94%   BMI 38.88 kg/m   Wt Readings from Last 5 Encounters:  09/10/18 216 lb (98 kg)  08/30/18 198 lb 12.8 oz (90.2 kg)  12/18/13 198 lb (89.8 kg)  10/11/13 199 lb 12.8 oz (90.6 kg)  09/30/13 199 lb 3.2 oz (90.4 kg)   Discussed weights with patient in office patient reports her weights have been stable  Physical Exam  Constitutional: She is oriented to person, place, and time and well-developed, well-nourished, and in no distress. Vital signs are normal. No distress.  + Elderly obese female  HENT:  Head: Normocephalic and atraumatic.  Right Ear: Hearing, tympanic membrane, external ear and ear canal normal.  Left Ear: Hearing, tympanic membrane, external ear and ear canal normal.  Nose:  Nose normal. Right sinus exhibits no maxillary sinus tenderness and no frontal sinus tenderness. Left sinus exhibits no maxillary sinus tenderness and no frontal sinus tenderness.  Mouth/Throat: Uvula is midline and oropharynx is clear and moist. No oropharyngeal exudate.  Eyes: Pupils are equal, round, and reactive to light.  Neck: Normal range of motion. Neck supple.  Cardiovascular: Normal rate, regular rhythm and normal heart sounds.  Pulmonary/Chest: Effort normal. No accessory muscle usage. No respiratory distress. She has no decreased breath sounds. She has wheezes (Slight upper airway wheeze). She has no rhonchi.  Abdominal: Soft. Bowel sounds are normal. There is no abdominal tenderness.  Musculoskeletal: Normal range of motion.        General: Edema (2-3+ lower extremity swelling) present.  Lymphadenopathy:    She has no cervical adenopathy.  Neurological: She is alert and oriented to person, place, and time. Gait normal.  Skin: Skin is warm and dry. She is not diaphoretic. No erythema.  Psychiatric: Mood, memory, affect and judgment normal.  Nursing note and vitals reviewed.     Lab Results:  CBC    Component Value Date/Time   WBC 15.1 (H) 08/29/2018 0312   RBC 4.76 08/29/2018 0312   HGB 13.9 08/29/2018 0312   HCT 41.7 08/29/2018 0312   PLT 210 08/29/2018 0312   MCV 87.6 08/29/2018 0312   MCH 29.2 08/29/2018 0312   MCHC 33.3 08/29/2018 0312   RDW 14.1 08/29/2018 0312   LYMPHSABS 2.0 08/24/2018 1225   MONOABS 0.7 08/24/2018 1225   EOSABS 0.0 08/24/2018 1225   BASOSABS 0.0 08/24/2018 1225    BMET    Component Value Date/Time   NA 134 (L) 08/29/2018 0312   K 4.6 08/29/2018 0312   CL 92 (L) 08/29/2018 0312   CO2 29 08/29/2018 0312   GLUCOSE 126 (H) 08/29/2018 0312   BUN 38 (H) 08/29/2018 0312   CREATININE 1.13 (H) 08/29/2018 0312   CALCIUM 9.4 08/29/2018 0312   GFRNONAA 47 (L) 08/29/2018 0312   GFRAA 55 (L) 08/29/2018 0312    BNP    Component Value  Date/Time   BNP 22.2 08/24/2018 1931    ProBNP No results found for: PROBNP    Assessment & Plan:   Chronic obstructive asthma with acute exacerbation of asthma ([place]) Assessment: Resolving asthma exacerbation today on exam Slight upper airway wheeze Patient currently on prednisone taper Patient adherent to Symbicort 160 BMI 38 Seasonal aspect to breathing typically worse months are December through February for the last 3 years No formal allergy testing  Plan:  Continue Symbicort 160 as prescribed Reviewed Symbicort  use and demonstrated on inhaler technique today Spacer provided to patient today Ordered pulmonary function testing Consider CBC with differential, and IgE at next office visit if patient is off of prednisone Patient to have follow-up with Dr. Lake Bells or Wyn Quaker NP after going through pulmonary function testing    Generalized edema Assessment: 2-3+ lower extremity edema Elevated weights in office visit today Patient reports adherence to fluid pill Patient denies orthopnea  Plan: Referral to cardiology Follow-up with primary care regarding lower extremities Elevate lower extremities Purchase compression stockings     Lauraine Rinne, NP 09/10/2018   This appointment was 35 min long with over 50% of the time in direct face-to-face patient care, assessment, plan of care, and follow-up.

## 2018-09-10 NOTE — Patient Instructions (Addendum)
We will schedule pulmonary function test in 2 to 4 weeks to evaluate breathing  Follow-up with Dr. Lake Bells preferably or Wyn Quaker, NP to review pulmonary function testing when scheduled  Continue Symbicort 160 >>> 2 puffs in the morning right when you wake up, rinse out your mouth after use, 12 hours later 2 puffs, rinse after use >>> Take this daily, no matter what >>> This is not a rescue inhaler  >>> Use your AeroChamber spacer when using Symbicort  Only use your albuterol as a rescue medication to be used if you can't catch your breath by resting or doing a relaxed purse lip breathing pattern.  - The less you use it, the better it will work when you need it. - Ok to use up to 2 puffs  every 4 hours if you must but call for immediate appointment if use goes up over your usual need - Don't leave home without it !!  (think of it like the spare tire for your car)  >>> We have refilled this medication for you  Okay to use albuterol nebulized medications every 4-6 hours as needed for shortness of breath and wheezing  Contact our office if you are having to use your albuterol nebulizer rescue inhaler often  Continue Singulair as prescribed  Omeprazole 20 mg tablet  >>>Please take 1 tablet daily 15 minutes to 30 minutes before your first meal of the day as well as before your other medications >>>Try to take at the same time each day >>>take this medication daily  GERD management: >>>Avoid laying flat until 2 hours after meals >>>Elevate head of the bed including entire chest >>>Reduce size of meals and amount of fat, acid, spices, caffeine and sweets >>>If you are smoking, Please stop! >>>Decrease alcohol consumption >>>Work on maintaining a healthy weight with normal BMI   Printed prescription for DME supplies for nebulizer mask provided to patient   Referral to cardiology per patient request >>>Patient would like to see Dr. Haroldine Laws >>> Patient could consider Dr. Einar Gip as  well   It is flu season:   >>>Remember to be washing your hands regularly, using hand sanitizer, be careful to use around herself with has contact with people who are sick will increase her chances of getting sick yourself. >>> Best ways to protect herself from the flu: Receive the yearly flu vaccine, practice good hand hygiene washing with soap and also using hand sanitizer when available, eat a nutritious meals, get adequate rest, hydrate appropriately   Please contact the office if your symptoms worsen or you have concerns that you are not improving.   Thank you for choosing [facility] for your healthcare, and for allowing [place] to partner with you on your healthcare journey. I am thankful to be able to provide care to you today.   Wyn Quaker FNP-C

## 2018-09-10 NOTE — Assessment & Plan Note (Addendum)
Assessment: Resolving asthma exacerbation today on exam Slight upper airway wheeze Patient currently on prednisone taper Patient adherent to Symbicort 160 BMI 38 Seasonal aspect to breathing typically worse months are December through February for the last 3 years No formal allergy testing  Plan:  Continue Symbicort 160 as prescribed Reviewed Symbicort use and demonstrated on inhaler technique today Spacer provided to patient today Ordered pulmonary function testing Consider CBC with differential, and IgE at next office visit if patient is off of prednisone Patient to have follow-up with Dr. Lake Bells or Wyn Quaker NP after going through pulmonary function testing

## 2018-09-11 ENCOUNTER — Telehealth: Payer: Self-pay | Admitting: Pulmonary Disease

## 2018-09-11 DIAGNOSIS — J449 Chronic obstructive pulmonary disease, unspecified: Secondary | ICD-10-CM

## 2018-09-11 MED ORDER — IPRATROPIUM-ALBUTEROL 0.5-2.5 (3) MG/3ML IN SOLN: 3.0000 mL | Freq: Four times a day (QID) | RESPIRATORY_TRACT | 11 refills | Status: AC

## 2018-09-11 NOTE — Telephone Encounter (Signed)
LMTCB x1 for Lattie Haw with [employer].

## 2018-09-11 NOTE — Telephone Encounter (Signed)
Pt is aware that [facility] can not supply nebulizer supplies and Rx. Pt aware that I will send order [facility] for supplies and medication Rx. Nothing more needed at this time.

## 2018-09-11 NOTE — Telephone Encounter (Signed)
Carrie White from [facility] returning phone call.  Phone number is [phone].

## 2018-09-13 ENCOUNTER — Telehealth: Payer: Self-pay | Admitting: Pulmonary Disease

## 2018-09-13 ENCOUNTER — Ambulatory Visit: Payer: Medicare Other | Admitting: Cardiology

## 2018-09-13 ENCOUNTER — Encounter: Payer: Self-pay | Admitting: Cardiology

## 2018-09-13 VITALS — BP 120/60 | HR 91 | Ht 62.5 in | Wt 212.1 lb

## 2018-09-13 DIAGNOSIS — I25119 Atherosclerotic heart disease of native coronary artery with unspecified angina pectoris: Secondary | ICD-10-CM

## 2018-09-13 DIAGNOSIS — R072 Precordial pain: Secondary | ICD-10-CM

## 2018-09-13 DIAGNOSIS — I10 Essential (primary) hypertension: Secondary | ICD-10-CM | POA: Diagnosis not present

## 2018-09-13 DIAGNOSIS — E785 Hyperlipidemia, unspecified: Secondary | ICD-10-CM

## 2018-09-13 DIAGNOSIS — E1169 Type 2 diabetes mellitus with other specified complication: Secondary | ICD-10-CM

## 2018-09-13 DIAGNOSIS — I5032 Chronic diastolic (congestive) heart failure: Secondary | ICD-10-CM

## 2018-09-13 MED ORDER — RANOLAZINE ER 500 MG PO TB12: 500.0000 mg | ORAL_TABLET | Freq: Two times a day (BID) | ORAL | 2 refills | Status: DC

## 2018-09-13 MED ORDER — ALBUTEROL SULFATE HFA 108 (90 BASE) MCG/ACT IN AERS: 1.0000 | INHALATION_SPRAY | Freq: Four times a day (QID) | RESPIRATORY_TRACT | 5 refills | Status: DC | PRN

## 2018-09-13 NOTE — Telephone Encounter (Signed)
Spoke with pt. She is needing a refill on Ventolin. Rx has been sent in. Nothing further was needed. 

## 2018-09-13 NOTE — Progress Notes (Signed)
Cardiology Consultation:    Date:  09/13/2018   ID:  Carrie White, DOB 1941-01-12, MRN [id number]  PCP:  Lauraine Rinne, MD  Cardiologist:  Jenne Campus, MD   Referring MD: Lauraine Rinne, NP   Chief Complaint  Patient presents with  . Abnormal imaging  i need to be seen by cardiologist for my heart problems  History of Present Illness:    Carrie White is a 78 y.o. female who is being seen today for the evaluation of diastolic congestive heart failure as well as calcification of coronary arteries at the request of Lauraine Rinne, NP.  Recently she ended going to hospital because of shortness of breath.  She does have bronchial asthma and also bronchitis that brought her to the hospital.  While in the hospital echocardiogram was done which showed preserved left ventricular ejection fraction however she did have diastolic dysfunction grade 1.  Also CT of her chest that was done to look for PE showed calcification of coronary arteries as well as aorta.  She does have multiple risk factors for coronary artery disease which include diabetes dyslipidemia remote history of smoking.  She described to have shortness of breath with exertion and also she said she got 1 flight of stairs when she does this flight of stairs sometimes she will get tightness in the chest.  This is not accelerated problem is stable.  Overall denies having any cardiac conditions there is no history of myocardial infarction no arrhythmias.  Past Medical History:  Diagnosis Date  . Complication of anesthesia   . Depression   . Diabetes mellitus    type II- uncontrolled  . FIBROMYALGIA 05/07/2007  . Fibromyalgia   . GERD (gastroesophageal reflux disease)   . GOITER, MULTINODULAR 07/01/2010  . HYPERLIPIDEMIA 07/31/2008  . HYPERTENSION 08/01/2008  . INSOMNIA, CHRONIC 06/29/2009  . Intraductal papilloma of breast 07/12/2011  . OSTEOARTHRITIS 05/07/2007  . PONV (postoperative nausea and vomiting)   . RESTLESS LEG  SYNDROME 05/07/2007    Past Surgical History:  Procedure Laterality Date  . ABDOMINAL HYSTERECTOMY    . BREAST MASS EXCISION  07/18/11   left breast  . BREAST SURGERY    . COLONOSCOPY    . [place] OF UTERUS  1972  . MASTECTOMY, PARTIAL  07/18/2011  . MOUTH SURGERY  [id number]  . OOPHORECTOMY    . TOTAL KNEE ARTHROPLASTY Right 09/09/2013   DR Mayer Camel  . TOTAL KNEE ARTHROPLASTY Left 09/09/2013   Procedure: TOTAL KNEE ARTHROPLASTY- left;  Surgeon: Kerin Salen, MD;  Location: [place];  Service: Orthopedics;  Laterality: Left;    Current Medications: Current Meds  Medication Sig  . ACCU-CHEK SOFTCLIX LANCETS lancets Use to test blood sugar 2 times daily as instructed. Dx code: 250.61  . albuterol (PROVENTIL HFA;VENTOLIN HFA) 108 (90 Base) MCG/ACT inhaler Inhale 1 puff into the lungs every 6 (six) hours as needed for wheezing or shortness of breath.  Marland Kitchen aspirin EC 81 MG EC tablet Take 1 tablet (81 mg total) by mouth daily.  . BD ULTRA-FINE PEN NEEDLES 29G X 12.7MM MISC USE DAILY  . BD ULTRA-FINE PEN NEEDLES 29G X 12.7MM MISC USE DAILY  . benzonatate (TESSALON) 100 MG capsule Take 1 capsule (100 mg total) by mouth 3 (three) times daily.  . Blood Glucose Monitoring Suppl (ACCU-CHEK AVIVA PLUS) W/DEVICE KIT Use to check blood sugar daily. Dx code: 250.61  . budesonide-formoterol (SYMBICORT) 160-4.5 MCG/ACT inhaler Inhale 2 puffs into the  lungs 2 (two) times daily.  . chlorpheniramine-HYDROcodone (TUSSIONEX) 10-8 MG/5ML SUER Take 5 mLs by mouth 2 (two) times daily.  . Dulaglutide (TRULICITY) 8.11 BJ/4.7WG SOPN Inject 0.75 mg into the skin once a week.  . ferrous sulfate 325 (65 FE) MG tablet Take 325 mg by mouth daily with breakfast.  . furosemide (LASIX) 20 MG tablet Take 40 mg by mouth daily.  Marland Kitchen gabapentin (NEURONTIN) 100 MG capsule Take 300 mg by mouth 2 (two) times daily.  Marland Kitchen glimepiride (AMARYL) 4 MG tablet Take 4 mg by mouth 2 (two) times daily.  Marland Kitchen glucose blood (ACCU-CHEK  AVIVA PLUS) test strip Use to test blood sugar 2 times daily as instructed. Dx code: 250.61  . Glucose Blood (BAYER BREEZE 2 TEST) DISK TEST 2 TIMES PER DAY  . Insulin Lispro Prot & Lispro (HUMALOG MIX 75/25 KWIKPEN) (75-25) 100 UNIT/ML Kwikpen Inject 50 Units into the skin 2 (two) times daily.  Marland Kitchen ipratropium-albuterol (DUONEB) 0.5-2.5 (3) MG/3ML SOLN Take 3 mLs by nebulization every 6 (six) hours.  . meloxicam (MOBIC) 7.5 MG tablet Take 7.5 mg by mouth daily.  . metFORMIN (GLUCOPHAGE-XR) 500 MG 24 hr tablet Take 1,000 mg by mouth at bedtime.  . montelukast (SINGULAIR) 10 MG tablet Take 10 mg by mouth at bedtime.  Marland Kitchen omeprazole (PRILOSEC) 40 MG capsule Take 1 capsule (40 mg total) by mouth daily. PT NEEDS NEW PCP FOR FURTHER REFILLS DR [name] RETIRED (Patient taking differently: Take 40 mg by mouth daily. )  . potassium chloride SA (K-DUR,KLOR-CON) 20 MEQ tablet Take 1 tablet (20 mEq total) by mouth daily.  . predniSONE (DELTASONE) 10 MG tablet Take 63m daily for 3days,Take 154mdaily for 3days, then stop  . rOPINIRole (REQUIP) 4 MG tablet Take 4 mg by mouth at bedtime.   . sertraline (ZOLOFT) 50 MG tablet Take 1 tablet (50 mg total) by mouth daily. (Patient taking differently: Take 100 mg by mouth daily. )  . simvastatin (ZOCOR) 40 MG tablet Take 1 tablet (40 mg total) by mouth at bedtime. PT NEEDS NEW PCP FOR FURTHER REFILLS DR [name] RETIRED (Patient taking differently: Take 40 mg by mouth at bedtime. )  . Spacer/Aero-Holding Chambers (AEROCHAMBER MV) inhaler Use as instructed  . traZODone (DESYREL) 100 MG tablet Take 100-200 mg by mouth at bedtime.     Allergies:   Patient has no known allergies.   Social History   Socioeconomic History  . Marital status: Widowed    Spouse name: Not on file  . Number of children: Not on file  . Years of education: Not on file  . Highest education level: Not on file  Occupational History  . Not on file  Social Needs  . Financial resource strain: Not  on file  . Food insecurity:    Worry: Not on file    Inability: Not on file  . Transportation needs:    Medical: Not on file    Non-medical: Not on file  Tobacco Use  . Smoking status: Former Smoker    Types: Cigarettes    Last attempt to quit: 10/28/1968    Years since quitting: 49.9  . Smokeless tobacco: Never Used  . Tobacco comment: only smoke 1 pack per Month when she did smoke more socially 09/10/18  Substance and Sexual Activity  . Alcohol use: No  . Drug use: No  . Sexual activity: Never  Lifestyle  . Physical activity:    Days per week: Not on file    Minutes  per session: Not on file  . Stress: Not on file  Relationships  . Social connections:    Talks on phone: Not on file    Gets together: Not on file    Attends religious service: Not on file    Active member of club or organization: Not on file    Attends meetings of clubs or organizations: Not on file    Relationship status: Not on file  Other Topics Concern  . 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 ROS:   Please see the history of present illness.    All 14 point review of systems negative except as described per history of present illness.  EKGs/Labs/Other Studies Reviewed:    The following studies were reviewed today: Normal sinus rhythm normal P interval normal QS complex duration morphology nonspecific ST segment changes  Recent Labs: 08/24/2018: B Natriuretic Peptide 22.2; TSH 0.210 08/29/2018: BUN 38; Creatinine, Ser 1.13; Hemoglobin 13.9; Platelets 210; Potassium 4.6; Sodium 134  Recent Lipid  Panel    Component Value Date/Time   CHOL 162 06/05/2013 1151   TRIG 84.0 06/05/2013 1151   HDL 55.70 06/05/2013 1151   CHOLHDL 3 06/05/2013 1151   VLDL 16.8 06/05/2013 1151   LDLCALC 90 06/05/2013 1151   LDLDIRECT 133.3 10/17/2006 0911    Physical Exam:    VS:  BP 120/60   Pulse 91   Ht 5' 2.5" (1.588 m)   Wt 212 lb 1.9 oz (96.2 kg)   SpO2 93%   BMI 38.18 kg/m     Wt Readings from Last 3 Encounters:  09/13/18 212 lb 1.9 oz (96.2 kg)  09/10/18 216 lb (98 kg)  08/30/18 198 lb 12.8 oz (90.2 kg)     GEN:  Well nourished, well developed in no acute distress HEENT: Normal NECK: No JVD; No carotid bruits LYMPHATICS: No lymphadenopathy CARDIAC: RRR, no murmurs, no rubs, no gallops RESPIRATORY:  Clear to auscultation without rales, wheezing or rhonchi  ABDOMEN: Soft, non-tender, non-distended MUSCULOSKELETAL:  No edema; No deformity  SKIN: Warm and dry NEUROLOGIC:  Alert and oriented x 3 PSYCHIATRIC:  Normal affect   ASSESSMENT:    1. Essential hypertension   2. Chronic diastolic congestive heart failure ([place])   3. Precordial chest pain   4. Coronary artery disease involving native coronary artery of native heart with angina pectoris ([place])   5. Type 2 diabetes mellitus with hyperlipidemia ([facility])    PLAN:    In order of problems listed above:  1. Chronic diastolic congestive heart failure.  On the clinical and physical examination she seems to be compensated I will ask her to have proBNP done today to see if there is any role for augmenting of her diuresis.  Overall based on echocardiogram is only stage I diastolic dysfunctions which is typically related to age. 2. Precordial chest pain happened when she climbs stairs obviously make me very concerned.  I will ask you to start taking ranolazine 500 mg twice daily.  I want her to wait until pulmonary function test will be done and  then will decide if we need to do stress test and what type.  She is already on aspirin which I  will continue. 3. Calcification of coronary arteries.  That indicate presence of coronary artery disease.  Again in the future most likely will do stress testing to determine if she get significant obstructive problem.  In the meantime ranolazine will be initiated. 4. Type 2 diabetes recently poorly controlled on top of that she is on steroids.  She may benefit from some newer agents like Jardiance. 5. Dyslipidemia she takes only simvastatin 40 mg.  Ideally for somebody like her high intensity statin should be prescribed.  So Crestor 20 of 40 or Lipitor 40 or 80.  Will reconsider this during next visit.   Medication Adjustments/Labs and Tests Ordered: Current medicines are reviewed at length with the patient today.  Concerns regarding medicines are outlined above.  No orders of the defined types were placed in this encounter.  No orders of the defined types were placed in this encounter.   Signed, Park Liter, MD, [facility]. 09/13/2018 9:35 AM    [facility]

## 2018-09-13 NOTE — Patient Instructions (Signed)
Medication Instructions:  Your physician has recommended you make the following change in your medication:  START ranolazine (ranexa) 500 mg: Take 1 tablet twice daily  If you need a refill on your cardiac medications before your next appointment, please call your pharmacy.   Lab work: Your physician recommends that you return for lab work today: BMP, ProBNP.   If you have labs (blood work) drawn today and your tests are completely normal, you will receive your results only by: Marland Kitchen MyChart Message (if you have MyChart) OR . A paper copy in the mail If you have any lab test that is abnormal or we need to change your treatment, we will call you to review the results.  Testing/Procedures: You had an EKG today.   Follow-Up: At [facility], you and your health needs are our priority.  As part of our continuing mission to provide you with exceptional heart care, we have created designated Provider Care Teams.  These Care Teams include your primary Cardiologist (physician) and Advanced Practice Providers (APPs -  Physician Assistants and Nurse Practitioners) who all work together to provide you with the care you need, when you need it. You will need a follow up appointment in 1 months.       Ranolazine tablets, extended release What is this medicine? RANOLAZINE (ra NOE la zeen) is a heart medicine. It is used to treat chronic chest pain (angina). This medicine must be taken regularly. It will not relieve an acute episode of chest pain. This medicine may be used for other purposes; ask your health care provider or pharmacist if you have questions. COMMON BRAND NAME(S): Ranexa What should I tell my health care provider before I take this medicine? They need to know if you have any of these conditions: -heart disease -irregular heartbeat -kidney disease -liver disease -low levels of potassium or magnesium in the blood -an unusual or allergic reaction to ranolazine, other medicines, foods,  dyes, or preservatives -pregnant or trying to get pregnant -breast-feeding How should I use this medicine? Take this medicine by mouth with a glass of water. Follow the directions on the prescription label. Do not cut, crush, or chew this medicine. Take with or without food. Do not take this medication with grapefruit juice. Take your doses at regular intervals. Do not take your medicine more often then directed. Talk to your pediatrician regarding the use of this medicine in children. Special care may be needed. Overdosage: If you think you have taken too much of this medicine contact a poison control center or emergency room at once. NOTE: This medicine is only for you. Do not share this medicine with others. What if I miss a dose? If you miss a dose, take it as soon as you can. If it is almost time for your next dose, take only that dose. Do not take double or extra doses. What may interact with this medicine? Do not take this medicine with any of the following medications: -antivirals for HIV or AIDS -cerivastatin -certain antibiotics like chloramphenicol, clarithromycin, dalfopristin; quinupristin, isoniazid, rifabutin, rifampin, rifapentine -certain medicines used for cancer like imatinib, nilotinib -certain medicines for fungal infections like fluconazole, itraconazole, ketoconazole, posaconazole, voriconazole -certain medicines for irregular heart beat like dofetilide, dronedarone -certain medicines for seizures like carbamazepine, fosphenytoin, oxcarbazepine, phenobarbital, phenytoin -cisapride -conivaptan -cyclosporine -grapefruit or grapefruit juice -lumacaftor; ivacaftor -nefazodone -pimozide -quinacrine -St John's wort -thioridazine -ziprasidone This medicine may also interact with the following medications: -alfuzosin -certain medicines for depression, anxiety, or psychotic  disturbances like bupropion, citalopram, fluoxetine, fluphenazine, paroxetine, perphenazine,  risperidone, sertraline, trifluoperazine -certain medicines for cholesterol like atorvastatin, lovastatin, simvastatin -certain medicines for stomach problems like octreotide, palonosetron, prochlorperazine -eplerenone -ergot alkaloids like dihydroergotamine, ergonovine, ergotamine, methylergonovine -metformin -nicardipine -other medicines that prolong the QT interval (cause an abnormal heart rhythm) -sirolimus -tacrolimus This list may not describe all possible interactions. Give your health care provider a list of all the medicines, herbs, non-prescription drugs, or dietary supplements you use. Also tell them if you smoke, drink alcohol, or use illegal drugs. Some items may interact with your medicine. What should I watch for while using this medicine? Visit your doctor for regular check ups. Tell your doctor or healthcare professional if your symptoms do not start to get better or if they get worse. This medicine will not relieve an acute attack of angina or chest pain. This medicine can change your heart rhythm. Your health care provider may check your heart rhythm by ordering an electrocardiogram (ECG) while you are taking this medicine. You may get drowsy or dizzy. Do not drive, use machinery, or do anything that needs mental alertness until you know how this medicine affects you. Do not stand or sit up quickly, especially if you are an older patient. This reduces the risk of dizzy or fainting spells. Alcohol may interfere with the effect of this medicine. Avoid alcoholic drinks. If you are scheduled for any medical or dental procedure, tell your healthcare provider that you are taking this medicine. This medicine can interact with other medicines used during surgery. What side effects may I notice from receiving this medicine? Side effects that you should report to your doctor or health care professional as soon as possible: -allergic reactions like skin rash, itching or hives, swelling of  the face, lips, or tongue -breathing problems -changes in vision -fast, irregular or pounding heartbeat -feeling faint or lightheaded, falls -low or high blood pressure -numbness or tingling feelings -ringing in the ears -tremor or shakiness -slow heartbeat (fewer than 50 beats per minute) -swelling of the legs or feet Side effects that usually do not require medical attention (report to your doctor or health care professional if they continue or are bothersome): -constipation -drowsy -dry mouth -headache -nausea or vomiting -stomach upset This list may not describe all possible side effects. Call your doctor for medical advice about side effects. You may report side effects to FDA at 1-800-FDA-1088. Where should I keep my medicine? Keep out of the reach of children. Store at room temperature between 15 and 30 degrees C (59 and 86 degrees F). Throw away any unused medicine after the expiration date. NOTE: This sheet is a summary. It may not cover all possible information. If you have questions about this medicine, talk to your doctor, pharmacist, or health care provider.  2019 Elsevier/Gold Standard (2015-09-03 12:24:15)

## 2018-09-13 NOTE — Telephone Encounter (Signed)
ATC [name].  Left message to call back.  Per 09/11/18 telephone note- Order was placed with [facility] for neb machine.

## 2018-09-13 NOTE — Telephone Encounter (Signed)
[name] returned the call.  States patient does not need nebulizer machine, that they are requesting the rescue inhaler with a blue top.  Requesting we call the patient, Carrie White at [phone] or at [phone].  She states she has the medications there and is confused.

## 2018-09-14 LAB — PRO B NATRIURETIC PEPTIDE: NT-Pro BNP: 50 pg/mL (ref 0–738)

## 2018-09-14 LAB — BASIC METABOLIC PANEL
BUN/Creatinine Ratio: 18 (ref 12–28)
BUN: 17 mg/dL (ref 8–27)
CHLORIDE: 97 mmol/L (ref 96–106)
CO2: 27 mmol/L (ref 20–29)
Calcium: 9.1 mg/dL (ref 8.7–10.3)
Creatinine, Ser: 0.92 mg/dL (ref 0.57–1.00)
GFR calc Af Amer: 70 mL/min/{1.73_m2} (ref >59)
GFR calc non Af Amer: 61 mL/min/{1.73_m2} (ref >59)
Glucose: 407 mg/dL — ABNORMAL HIGH (ref 65–99)
Potassium: 4.3 mmol/L (ref 3.5–5.2)
Sodium: 139 mmol/L (ref 134–144)

## 2018-09-18 ENCOUNTER — Encounter: Payer: Self-pay | Admitting: Pulmonary Disease

## 2018-09-18 ENCOUNTER — Telehealth: Payer: Self-pay | Admitting: Pulmonary Disease

## 2018-09-18 MED ORDER — PREDNISONE 10 MG PO TABS: ORAL_TABLET | ORAL | 0 refills | Status: DC

## 2018-09-18 NOTE — Telephone Encounter (Signed)
Patient returning, CB is [phone]

## 2018-09-18 NOTE — Telephone Encounter (Signed)
Can offer:   Prednisone 10mg  tablet  >>>4 tabs for 2 days, then 3 tabs for 2 days, 2 tabs for 2 days, then 1 tab for 2 days, then stop >>>take with food  >>>take in the morning   Please place the order.   Pt needs to keep follow up with our office.   Wyn Quaker FNP

## 2018-09-18 NOTE — Telephone Encounter (Signed)
Called and spoke with patient she is aware of response and verbalized understanding. Order sent.

## 2018-09-18 NOTE — Telephone Encounter (Signed)
09/18/2018 1512   Contacted the patient to discuss her symptoms.  After reviewing the patient's chart I instructed the patient to not start taking the prednisone taper until I can evaluate her in the office visit tomorrow.  My concern is that the patient may not be able to come off of chronic steroids.  She just finished a steroid taper on 09/14/2018.  Hopefully since she is off of 5 days of steroids I may be able to see peripheral eosinophilia or an elevated IgE to be able to get the patient on additional therapies for managing her persistent asthma.  We have been unable to do this blood work yet as she has been in a persistent exacerbation since her hospitalization or managed on steroids.   Also discussed with patient that if symptoms worsen between now and her office visit tomorrow morning that she needs to present to an emergency room for further evaluation.  Patient agrees.  Wyn Quaker, FNP

## 2018-09-18 NOTE — Progress Notes (Signed)
'@Patient'  ID: Carrie White, female    DOB: [birth year], 78 y.o.   MRN: [id number]  Chief Complaint  Patient presents with  . Acute Visit    sob wheezing    Referring provider: Lauraine Rinne, MD  HPI:  78 year old female former smoker initially consulted with our office on 08/24/2018 for management of suspected asthma  PMH: Hypertension, type 2 diabetes Smoker/ Smoking History: Former smoker.  Smoked socially.  Quit 1970 Maintenance: Symbicort 160 Pt of: Dr. Lake Bells  09/19/2018  - Visit   78 year old female presenting to our office today for an acute visit.  Patient reports that on 09/15/2018 she finished her prednisone taper that was prescribed by their rehab facility that she was living in and symptoms started to worsen as soon as she finished the prednisone taper.  Patient reports that she is had 3 to 4 days of increased weakness, fatigue, shortness of breath, wheezing.  Patient reports that she is been adherent to her Symbicort 160, uses her DuoNeb nebulized medications twice daily, and continues to take Singulair daily.  She is completed follow-up with primary care, and cardiology but was on prednisone at that time people felt that she was doing well.  She has a follow-up with both of those offices next month.  Patient is scheduled to complete pulmonary function testing next week.  Unfortunately the patient has been treated with rounds and rounds of prednisone and antibiotics over the past 6 months.  Patient's baseline blood sugars have been running in the 300s.  She continues to follow-up with endocrinology but her blood sugars continue to be uncontrolled.  Patient is also not currently following a diabetic diet.  Endocrinology would like to be notified by the patient if she is started on prednisone again so they can make changes to her insulin drug regimen.     Tests:  09/19/2018-office spirometry-FVC 2.0 (76% predicted), FEV1 1.6 (79% predicted), ratio  78  09/19/2018-FeNO-23  08/24/2018- resp viral panel > negative  08/24/2018- CT angiogram chest images independently reviewed from August 24, 2018: No pulmonary embolism, no significant pulmonary parenchymal abnormality noted, some coronary calcifications  08/25/2018-echocardiogram-LV ejection fraction 55 to 71%, grade 1 diastolic dysfunction, normal pulmonary artery systolic pressure  Chart review: 09/13/2018-cardiology consult- Dr. Agustin Cree Plan: We will order BNP to examine chronic diastolic heart failure, started on Ranexa 500 mg twice daily for management of precordial chest pain  09/12/2018-primary care hospital follow-up [facility] >>> PCP suggesting the patient discussed with pulmonary evaluation for obstructive sleep apnea  FENO:  Lab Results  Component Value Date   NITRICOXIDE 23 09/19/2018    PFT: No flowsheet data found.  Imaging: Dg Chest 2 View  Result Date: 09/19/2018 CLINICAL DATA:  Dyspnea on exertion for the past week. EXAM: CHEST - 2 VIEW COMPARISON:  Chest x-ray dated August 24, 2018. FINDINGS: Stable mild cardiomegaly. Normal pulmonary vascularity. No focal consolidation, pleural effusion, or pneumothorax. No acute osseous abnormality. IMPRESSION: No active cardiopulmonary disease. Electronically Signed   By: Titus Dubin M.D.   On: 09/19/2018 12:15   Dg Chest 2 View  Result Date: 08/24/2018 CLINICAL DATA:  Cough and cold symptoms for several weeks EXAM: CHEST - 2 VIEW COMPARISON:  08/13/2018 FINDINGS: Cardiac shadow is mildly enlarged. The lungs are clear bilaterally. No sizable effusion is seen. Degenerative changes of the thoracic spine are noted. IMPRESSION: No active cardiopulmonary disease. Electronically Signed   By: Inez Catalina M.D.   On: 08/24/2018 13:14  Ct Angio Chest Pe W And/or Wo Contrast  Result Date: 08/24/2018 CLINICAL DATA:  Ct chest pe, Pt reports cold symptoms, cough, generalized weakness for 5 weeks per pt. Pt seen  by PCP and treated with steroids and abx without relief EXAM: CT ANGIOGRAPHY CHEST WITH CONTRAST TECHNIQUE: Multidetector CT imaging of the chest was performed using the standard protocol during bolus administration of intravenous contrast. Multiplanar CT image reconstructions and MIPs were obtained to evaluate the vascular anatomy. CONTRAST:  152m ISOVUE-370 IOPAMIDOL (ISOVUE-370) INJECTION 76% COMPARISON:  None. FINDINGS: Cardiovascular: Heart size upper limits normal. No pericardial effusion. RV is nondilated. Mildly dilated central pulmonary arteries. Satisfactory opacification of pulmonary arteries noted, and there is no evidence of pulmonary emboli. Scattered coronary calcifications. Adequate contrast opacification of the thoracic aorta with no evidence of dissection, aneurysm, or stenosis. There is classic 3-vessel brachiocephalic arch anatomy without proximal stenosis. Scattered calcified plaque in the distal arch and descending thoracic segment. Mediastinum/Nodes: No hilar or mediastinal adenopathy. Lungs/Pleura: No pleural effusion. No pneumothorax. Mild dependent atelectasis posteriorly in both lungs. No nodule or focal infiltrate. Upper Abdomen: No acute findings. Musculoskeletal: Anterior vertebral endplate spurring at multiple levels in the mid and lower thoracic spine. No fracture or worrisome bone lesion. Review of the MIP images confirms the above findings. IMPRESSION: 1. Negative for acute PE or thoracic aortic dissection. 2. Atherosclerosis, including aortic and coronary artery disease. Please note that although the presence of coronary artery calcium documents the presence of coronary artery disease, the severity of this disease and any potential stenosis cannot be assessed on this non-gated CT examination. Assessment for potential risk factor modification, dietary therapy or pharmacologic therapy may be warranted, if clinically indicated. Electronically Signed   By: DLucrezia EuropeM.D.   On:  08/24/2018 14:47      Specialty Problems      Pulmonary Problems   Chronic obstructive asthma with acute exacerbation of asthma ([place]    Suspected but no formal pulmonary function testing  09/19/2018-office spirometry-FVC 2.0 (76% predicted), FEV1 1.6 (79% predicted), ratio 78 09/19/2018-FeNO-23      Dyspnea on exertion      No Known Allergies  Immunization History  Administered Date(s) Administered  . Influenza Split 06/05/2013  . Influenza Whole 05/18/2009, 05/06/2010  . Influenza, High Dose Seasonal PF 05/28/2018  . Influenza-Unspecified 05/16/2011  . Pneumococcal Polysaccharide-23 06/10/2004, 10/22/2012  . Td 06/10/2004    Past Medical History:  Diagnosis Date  . Complication of anesthesia   . Depression   . Diabetes mellitus    type II- uncontrolled  . FIBROMYALGIA 05/07/2007  . Fibromyalgia   . GERD (gastroesophageal reflux disease)   . GOITER, MULTINODULAR 07/01/2010  . HYPERLIPIDEMIA 07/31/2008  . HYPERTENSION 08/01/2008  . INSOMNIA, CHRONIC 06/29/2009  . Intraductal papilloma of breast 07/12/2011  . OSTEOARTHRITIS 05/07/2007  . PONV (postoperative nausea and vomiting)   . RESTLESS LEG SYNDROME 05/07/2007    Tobacco History: Social History   Tobacco Use  Smoking Status Former Smoker  . Types: Cigarettes  . Last attempt to quit: 10/28/1968  . Years since quitting: 49.9  Smokeless Tobacco Never Used  Tobacco Comment   only smoke 1 pack per Month when she did smoke more socially 09/10/18   Counseling given: Yes Comment: only smoke 1 pack per Month when she did smoke more socially 09/10/18  Continue to not smoke  Outpatient Encounter Medications as of 09/19/2018  Medication Sig  . ACCU-CHEK SOFTCLIX LANCETS lancets Use to test blood sugar 2 times  daily as instructed. Dx code: 250.61  . albuterol (PROVENTIL HFA;VENTOLIN HFA) 108 (90 Base) MCG/ACT inhaler Inhale 1 puff into the lungs every 6 (six) hours as needed for wheezing or shortness of breath.  Marland Kitchen  aspirin EC 81 MG EC tablet Take 1 tablet (81 mg total) by mouth daily.  . BD ULTRA-FINE PEN NEEDLES 29G X 12.7MM MISC USE DAILY  . BD ULTRA-FINE PEN NEEDLES 29G X 12.7MM MISC USE DAILY  . benzonatate (TESSALON) 100 MG capsule Take 1 capsule (100 mg total) by mouth 3 (three) times daily.  . Blood Glucose Monitoring Suppl (ACCU-CHEK AVIVA PLUS) W/DEVICE KIT Use to check blood sugar daily. Dx code: 250.61  . budesonide-formoterol (SYMBICORT) 160-4.5 MCG/ACT inhaler Inhale 2 puffs into the lungs 2 (two) times daily.  . chlorpheniramine-HYDROcodone (TUSSIONEX) 10-8 MG/5ML SUER Take 5 mLs by mouth 2 (two) times daily.  . Dulaglutide (TRULICITY) 2.54 YH/0.6CB SOPN Inject 0.75 mg into the skin once a week.  . ferrous sulfate 325 (65 FE) MG tablet Take 325 mg by mouth daily with breakfast.  . furosemide (LASIX) 20 MG tablet Take 40 mg by mouth daily.  Marland Kitchen gabapentin (NEURONTIN) 100 MG capsule Take 300 mg by mouth 2 (two) times daily.  Marland Kitchen glimepiride (AMARYL) 4 MG tablet Take 4 mg by mouth 2 (two) times daily.  Marland Kitchen glucose blood (ACCU-CHEK AVIVA PLUS) test strip Use to test blood sugar 2 times daily as instructed. Dx code: 250.61  . Glucose Blood (BAYER BREEZE 2 TEST) DISK TEST 2 TIMES PER DAY  . Insulin Lispro Prot & Lispro (HUMALOG MIX 75/25 KWIKPEN) (75-25) 100 UNIT/ML Kwikpen Inject 50 Units into the skin 2 (two) times daily.  Marland Kitchen ipratropium-albuterol (DUONEB) 0.5-2.5 (3) MG/3ML SOLN Take 3 mLs by nebulization every 6 (six) hours.  . meloxicam (MOBIC) 7.5 MG tablet Take 7.5 mg by mouth daily.  . metFORMIN (GLUCOPHAGE-XR) 500 MG 24 hr tablet Take 1,000 mg by mouth at bedtime.  . montelukast (SINGULAIR) 10 MG tablet Take 10 mg by mouth at bedtime.  Marland Kitchen omeprazole (PRILOSEC) 40 MG capsule Take 1 capsule (40 mg total) by mouth daily. PT NEEDS NEW PCP FOR FURTHER REFILLS DR [name] RETIRED (Patient taking differently: Take 40 mg by mouth daily. )  . potassium chloride SA (K-DUR,KLOR-CON) 20 MEQ tablet Take 1 tablet  (20 mEq total) by mouth daily.  . ranolazine (RANEXA) 500 MG 12 hr tablet Take 1 tablet (500 mg total) by mouth 2 (two) times daily.  Marland Kitchen rOPINIRole (REQUIP) 4 MG tablet Take 4 mg by mouth at bedtime.   . sertraline (ZOLOFT) 50 MG tablet Take 1 tablet (50 mg total) by mouth daily. (Patient taking differently: Take 100 mg by mouth daily. )  . simvastatin (ZOCOR) 40 MG tablet Take 1 tablet (40 mg total) by mouth at bedtime. PT NEEDS NEW PCP FOR FURTHER REFILLS DR [name] RETIRED (Patient taking differently: Take 40 mg by mouth at bedtime. )  . Spacer/Aero-Holding Chambers (AEROCHAMBER MV) inhaler Use as instructed  . traZODone (DESYREL) 100 MG tablet Take 100-200 mg by mouth at bedtime.  . hydrOXYzine (VISTARIL) 25 MG capsule Can take 1 or 2 tablets every 6 hours as needed for anxiety. This medication is sedating.  . predniSONE (DELTASONE) 10 MG tablet As instructed on AVS.  . [DISCONTINUED] predniSONE (DELTASONE) 10 MG tablet Take 23m daily for 3days,Take 141mdaily for 3days, then stop (Patient not taking: Reported on 09/19/2018)  . [DISCONTINUED] predniSONE (DELTASONE) 10 MG tablet 4 tabs for 2 days,  then 3 tabs for 2 days, 2 tabs for 2 days, then 1 tab for 2 days, then stop (Patient not taking: Reported on 09/19/2018)  . [EXPIRED] ipratropium-albuterol (DUONEB) 0.5-2.5 (3) MG/3ML nebulizer solution 3 mL    No facility-administered encounter medications on file as of 09/19/2018.      Review of Systems  Review of Systems  Constitutional: Positive for fatigue.  HENT: Positive for congestion. Negative for sinus pressure and sinus pain.   Respiratory: Positive for cough and wheezing.   Cardiovascular: Positive for leg swelling. Negative for chest pain and palpitations.  Gastrointestinal: Negative for diarrhea, nausea and vomiting.  Neurological: Positive for weakness.  Psychiatric/Behavioral: The patient is nervous/anxious.      Physical Exam  BP 102/60 (BP Location: Left Arm, Cuff Size: Normal)    Pulse (!) 119   Temp 98.5 F (36.9 C) (Oral)   Ht '5\' 3"'  (1.6 m)   Wt 210 lb 9.6 oz (95.5 kg)   SpO2 94%   BMI 37.31 kg/m   Wt Readings from Last 5 Encounters:  09/19/18 210 lb 9.6 oz (95.5 kg)  09/13/18 212 lb 1.9 oz (96.2 kg)  09/10/18 216 lb (98 kg)  08/30/18 198 lb 12.8 oz (90.2 kg)  12/18/13 198 lb (89.8 kg)    Physical Exam  Constitutional: She is oriented to person, place, and time and well-developed, well-nourished, and in no distress. Vital signs are normal. She has a sickly appearance. No distress.  Elderly obese female  HENT:  Head: Normocephalic and atraumatic.  Right Ear: Hearing, tympanic membrane, external ear and ear canal normal.  Left Ear: Hearing, tympanic membrane, external ear and ear canal normal.  Nose: Nose normal. Right sinus exhibits no maxillary sinus tenderness and no frontal sinus tenderness. Left sinus exhibits no maxillary sinus tenderness and no frontal sinus tenderness.  Mouth/Throat: Uvula is midline and oropharynx is clear and moist. No oropharyngeal exudate.  Eyes: Pupils are equal, round, and reactive to light.  Neck: Normal range of motion. Neck supple.  Cardiovascular: Regular rhythm and normal heart sounds. Tachycardia present.  Distant heart sounds  Pulmonary/Chest: Effort normal. No accessory muscle usage. No respiratory distress. She has no decreased breath sounds. She has wheezes (Upper airway wheeze). She has no rhonchi.  Distant breath sounds likely due to body habitus  Abdominal: Soft. Bowel sounds are normal. There is no abdominal tenderness.  Musculoskeletal: Normal range of motion.        General: No edema.  Lymphadenopathy:    She has no cervical adenopathy.  Neurological: She is alert and oriented to person, place, and time. Gait normal.  Skin: Skin is warm and dry. She is not diaphoretic. No erythema.  Psychiatric: Memory, affect and judgment normal. Her mood appears anxious. She exhibits a depressed mood.  Patient is  anxious regarding her breathing as well as chronic medical issues.  Patient is concerned that this is how she will "always feel"  Nursing note and vitals reviewed.   09/19/2018-office spirometry-FVC 2.0 (76% predicted), FEV1 1.6 (79% predicted), ratio 78  09/19/2018-FeNO-23  Lab Results:  CBC    Component Value Date/Time   WBC 15.1 (H) 08/29/2018 0312   RBC 4.76 08/29/2018 0312   HGB 13.9 08/29/2018 0312   HCT 41.7 08/29/2018 0312   PLT 210 08/29/2018 0312   MCV 87.6 08/29/2018 0312   MCH 29.2 08/29/2018 0312   MCHC 33.3 08/29/2018 0312   RDW 14.1 08/29/2018 0312   LYMPHSABS 2.0 08/24/2018 1225   MONOABS 0.7  08/24/2018 1225   EOSABS 0.0 08/24/2018 1225   BASOSABS 0.0 08/24/2018 1225    BMET    Component Value Date/Time   NA 139 09/13/2018 1001   K 4.3 09/13/2018 1001   CL 97 09/13/2018 1001   CO2 27 09/13/2018 1001   GLUCOSE 407 (H) 09/13/2018 1001   GLUCOSE 126 (H) 08/29/2018 0312   BUN 17 09/13/2018 1001   CREATININE 0.92 09/13/2018 1001   CALCIUM 9.1 09/13/2018 1001   GFRNONAA 61 09/13/2018 1001   GFRAA 70 09/13/2018 1001    BNP    Component Value Date/Time   BNP 22.2 08/24/2018 1931    ProBNP    Component Value Date/Time   PROBNP 50 09/13/2018 1001      Assessment & Plan:   Discussion: I have discussed with the patient, her family member accompanying her, as well as Dr. Lake Bells that so far the patient does not have a obvious pulmonary reason for her shortness of breath.  Unfortunately we do not have formal pulmonary function testing completed on the patient yet but spirometry in office today just shows normal spirometry with restriction likely due to the fact that the patient has a BMI of 37.3.  Patient does have ongoing fatigue which is likely multifactorial due to the fact that she has had uncontrolled blood sugars for the last 6 months as well as a recent hospitalization and skilled nursing facility visit.  FeNO today is normal.  We will do baseline  blood work to check to make sure patient does not have peripheral eosinophilia or elevated IgE.  Will prescribe short prednisone taper with the patient as she reports that she has significant improvement with her breathing when she is on prednisone but as I discussed with the patient this could actually be worsening her symptoms as well as anxiety.  Reports are prescribed hydroxyzine for management of anxiety.  Patient has been referred to cardiology and has had a formal work-up.  Patient will get a chest x-ray today.  Patient needs to follow-up with primary care  Patient was able to talk calmly, complete a spirometry, complete FeNO this office visit.  There is a high likelihood that this simply can be linked to the patient's BMI of 37.3, restriction on spirometry today as well as her being decompensated.  I am not convinced that the patient will need chronic prednisone therapy or chronic inhaler therapy at this time based off of what we have found so far.  We will see patient back next week for follow-up.   Chronic obstructive asthma with acute exacerbation of asthma ([facility]) Assessment: Lungs clear to auscultation today Upper airway wheeze present which has been present since hospitalization Patient endorsing significant breathing response when on prednisone Patient adherent to Symbicort 160 FeNO today is 23 Office spirometry today shows restrictive lung disease but no formal obstruction  Plan: CBC with differential today IgE today Chest x-ray today DuoNeb in office today Continue Symbicort 160 2 puffs twice daily Continue DuoNeb nebulized medications as needed for shortness of breath and wheezing We will also prescribe hydroxyzine for management of anxiety and postnasal drip Continue Singulair daily Contact our office next week and let us know how you are doing Keep follow-up scheduled with Wyn Quaker, NP   Type 2 diabetes mellitus with hyperlipidemia ([place]) Assessment: Patient  reporting blood sugars running in the 300s Patient with known uncontrolled diabetes likely due to diet, inactivity and recent prednisone use  Plan: Contact endocrinology to let them know that  you have been prescribed a short taper of prednisone Schedule an appointment with endocrinology and adhere to their medication regimen Work with endocrinology for diabetic education as well as medical nutrition therapy to help reduce your BMI  Generalized edema Assessment: 2+ lower extremity edema bilaterally same as last office visit Recent BNP lab work shows normal  Plan: Continue fluid pill as prescribed Continue follow-up with cardiology   Dyspnea on exertion 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 today FeNO today is 23 Patient has been maintained on Symbicort 160 BMI 37.3 Uncontrolled blood sugars in the 300s Lungs sounds diminished throughout exam with upper airway wheeze  Plan: Chest x-ray today FeNO today Spirometry today Continue Symbicort 160 2 puffs twice daily Lab work today Will prescribe short taper of prednisone starting at 20 mg as patient reports significant improvement when on prednisone >>> I cautioned the patient today that this could actually worsen anxiety which could also be contributing to her symptoms Keep follow-up with our office next week Contact our office at the beginning of next week to let us know how you are doing   Anxiety Assessment: Patient is anxious on exam today Patient is managed on Zoloft  Plan: Continue follow-up with primary care Will prescribe hydroxyzine for helping with anxiety as well as postnasal drip Follow-up with primary care for chronic anxiety management     Lauraine Rinne, NP 09/19/2018   This appointment was 55 min long with over 50% of the time in direct face-to-face patient care, assessment, plan of care, and follow-up.

## 2018-09-18 NOTE — Telephone Encounter (Signed)
Primary Pulmonologist: Mcquaid Last office visit and with whom: 09/05/18 Wyn Quaker What do we see them for (pulmonary problems): asthma Last OV assessment/plan: 2-4 week f/u with PFT, continue symbicort  Was appointment offered to patient (explain)?  Yes, pt not able tio make it today but is scheduled for acute visit 09/19/18 @1030  with Aaron Edelman   Reason for call: Pt having dry cough, weakness, wheezing, no fever.  Has been feeling this way since last week but it has gotten worse over the last day or two.  She is not taking any meds that weren't prescribed to her.  She is using her albuterol nebs twice a day with no relief.  Using symbicort twice a day.  She is out of her tussionex and mucinex.  Does have acute scheduled for tomorrow but was requesting recommendations until tomorrow.  (examples of things to ask: : When did symptoms start? Fever? Cough? Productive? Color to sputum? More sputum than usual? Wheezing? Have you needed increased oxygen? Are you taking your respiratory medications? What over the counter measures have you tried?)

## 2018-09-18 NOTE — Telephone Encounter (Signed)
Called patient, unable to reach left message to give us a call back. 

## 2018-09-18 NOTE — Telephone Encounter (Signed)
Error

## 2018-09-19 ENCOUNTER — Ambulatory Visit (INDEPENDENT_AMBULATORY_CARE_PROVIDER_SITE_OTHER)
Admission: RE | Admit: 2018-09-19 | Discharge: 2018-09-19 | Disposition: A | Discharge status: Home / Self Care | Payer: Medicare Other | Source: Ambulatory Visit | Attending: Pulmonary Disease | Admitting: Pulmonary Disease

## 2018-09-19 ENCOUNTER — Ambulatory Visit (INDEPENDENT_AMBULATORY_CARE_PROVIDER_SITE_OTHER): Payer: Medicare Other | Admitting: Pulmonary Disease

## 2018-09-19 ENCOUNTER — Encounter: Payer: Self-pay | Admitting: Pulmonary Disease

## 2018-09-19 VITALS — BP 102/60 | HR 119 | Temp 98.5°F | Ht 63.0 in | Wt 210.6 lb

## 2018-09-19 DIAGNOSIS — R0609 Other forms of dyspnea: Secondary | ICD-10-CM

## 2018-09-19 DIAGNOSIS — F419 Anxiety disorder, unspecified: Secondary | ICD-10-CM

## 2018-09-19 DIAGNOSIS — E785 Hyperlipidemia, unspecified: Secondary | ICD-10-CM

## 2018-09-19 DIAGNOSIS — J441 Chronic obstructive pulmonary disease with (acute) exacerbation: Secondary | ICD-10-CM | POA: Diagnosis not present

## 2018-09-19 DIAGNOSIS — E1169 Type 2 diabetes mellitus with other specified complication: Secondary | ICD-10-CM | POA: Diagnosis not present

## 2018-09-19 DIAGNOSIS — R601 Generalized edema: Secondary | ICD-10-CM

## 2018-09-19 LAB — COMPREHENSIVE METABOLIC PANEL
ALT: 29 U/L (ref 0–35)
AST: 20 U/L (ref 0–37)
Albumin: 3.7 g/dL (ref 3.5–5.2)
Alkaline Phosphatase: 75 U/L (ref 39–117)
BUN: 35 mg/dL — ABNORMAL HIGH (ref 6–23)
CO2: 24 mEq/L (ref 19–32)
Calcium: 9 mg/dL (ref 8.4–10.5)
Chloride: 96 mEq/L (ref 96–112)
Creatinine, Ser: 1.21 mg/dL — ABNORMAL HIGH (ref 0.40–1.20)
GFR: 43.17 mL/min — ABNORMAL LOW (ref >60.00)
Glucose, Bld: 328 mg/dL — ABNORMAL HIGH (ref 70–99)
POTASSIUM: 3.6 meq/L (ref 3.5–5.1)
Sodium: 133 mEq/L — ABNORMAL LOW (ref 135–145)
Total Bilirubin: 0.5 mg/dL (ref 0.2–1.2)
Total Protein: 6.6 g/dL (ref 6.0–8.3)

## 2018-09-19 LAB — CBC WITH DIFFERENTIAL/PLATELET
Basophils Absolute: 0.1 10*3/uL (ref 0.0–0.1)
Basophils Relative: 0.6 % (ref 0.0–3.0)
Eosinophils Absolute: 0.1 10*3/uL (ref 0.0–0.7)
Eosinophils Relative: 1.1 % (ref 0.0–5.0)
HEMATOCRIT: 39.6 % (ref 36.0–46.0)
Hemoglobin: 13.1 g/dL (ref 12.0–15.0)
Lymphocytes Relative: 26.3 % (ref 12.0–46.0)
Lymphs Abs: 2.6 10*3/uL (ref 0.7–4.0)
MCHC: 33 g/dL (ref 30.0–36.0)
MCV: 88.5 fl (ref 78.0–100.0)
Monocytes Absolute: 0.8 10*3/uL (ref 0.1–1.0)
Monocytes Relative: 7.8 % (ref 3.0–12.0)
Neutro Abs: 6.3 10*3/uL (ref 1.4–7.7)
Neutrophils Relative %: 64.2 % (ref 43.0–77.0)
Platelets: 178 10*3/uL (ref 150.0–400.0)
RBC: 4.47 Mil/uL (ref 3.87–5.11)
RDW: 15.7 % — ABNORMAL HIGH (ref 11.5–15.5)
WBC: 9.9 10*3/uL (ref 4.0–10.5)

## 2018-09-19 LAB — NITRIC OXIDE: Nitric Oxide: 23

## 2018-09-19 MED ORDER — PREDNISONE 10 MG PO TABS: ORAL_TABLET | ORAL | 0 refills | Status: DC

## 2018-09-19 MED ORDER — HYDROXYZINE PAMOATE 25 MG PO CAPS: ORAL_CAPSULE | ORAL | 3 refills | Status: DC

## 2018-09-19 MED ORDER — IPRATROPIUM-ALBUTEROL 0.5-2.5 (3) MG/3ML IN SOLN
3.0000 mL | Freq: Once | RESPIRATORY_TRACT | Status: AC
  Administered 2018-09-19: 3 mL via RESPIRATORY_TRACT

## 2018-09-19 NOTE — Assessment & Plan Note (Signed)
Assessment: 2+ lower extremity edema bilaterally same as last office visit Recent BNP lab work shows normal  Plan: Continue fluid pill as prescribed Continue follow-up with cardiology

## 2018-09-19 NOTE — Assessment & Plan Note (Addendum)
Assessment: Lungs clear to auscultation today Upper airway wheeze present which has been present since hospitalization Patient endorsing significant breathing response when on prednisone Patient adherent to Symbicort 160 FeNO today is 23 Office spirometry today shows restrictive lung disease but no formal obstruction  Plan: CBC with differential today IgE today Chest x-ray today DuoNeb in office today Continue Symbicort 160 2 puffs twice daily Continue DuoNeb nebulized medications as needed for shortness of breath and wheezing We will also prescribe hydroxyzine for management of anxiety and postnasal drip Continue Singulair daily Contact our office next week and let us know how you are doing Keep follow-up scheduled with Wyn Quaker, NP

## 2018-09-19 NOTE — Progress Notes (Signed)
Your chest x-ray results of come back.  Showing no acute changes.  No plan of care changes at this time.  Keep follow-up appointment.    Follow-up with our office if symptoms worsen or you do not feel like you are improving under her current regimen.  It was a pleasure taking care of you,  Analeese Andreatta, FNP 

## 2018-09-19 NOTE — Assessment & Plan Note (Signed)
Assessment: Patient reporting blood sugars running in the 300s Patient with known uncontrolled diabetes likely due to diet, inactivity and recent prednisone use  Plan: Contact endocrinology to let them know that you have been prescribed a short taper of prednisone Schedule an appointment with endocrinology and adhere to their medication regimen Work with endocrinology for diabetic education as well as medical nutrition therapy to help reduce your BMI

## 2018-09-19 NOTE — Patient Instructions (Addendum)
FENO today  >>>23 today   Chest Xray today   Spirometry today  >>>normal   Lab work today >>> CBC Diff and IgE, CMET   Prednisone 10mg  tablet  >>>2 tabs (20mg  daily) daily for 5 days, then decrease to 1 tablet (10mg ) daily for 5 days >>>take with food  >>>take in the morning   Continue Symbicort 160 >>> 2 puffs in the morning right when you wake up, rinse out your mouth after use, 12 hours later 2 puffs, rinse after use >>> Take this daily, no matter what >>> This is not a rescue inhaler   Can use duo nebs every 6 hours as needed for shortness of breath and wheezing  Hydroxyzine 25mg  tablet  >>> Can take 1 or 2 tablets every 6 hours as needed for anxiety >>>This medication is sedating  Continue Singulair    Contact endocrinology and let them know that we have started you back on prednisone >>>>Discuss with endocrinology need for medical nutrition therapy and diabetic education    Contact our office next week to let us know how you are doing  Keep 09/28/2018 office visit with Wyn Quaker, NP    It is flu season:   >>>Remember to be washing your hands regularly, using hand sanitizer, be careful to use around herself with has contact with people who are sick will increase her chances of getting sick yourself. >>> Best ways to protect herself from the flu: Receive the yearly flu vaccine, practice good hand hygiene washing with soap and also using hand sanitizer when available, eat a nutritious meals, get adequate rest, hydrate appropriately   Please contact the office if your symptoms worsen or you have concerns that you are not improving.   Thank you for choosing [facility] for your healthcare, and for allowing [place] to partner with you on your healthcare journey. I am thankful to be able to provide care to you today.   Wyn Quaker FNP-C

## 2018-09-19 NOTE — Assessment & Plan Note (Signed)
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 today FeNO today is 23 Patient has been maintained on Symbicort 160 BMI 37.3 Uncontrolled blood sugars in the 300s Lungs sounds diminished throughout exam with upper airway wheeze  Plan: Chest x-ray today FeNO today Spirometry today Continue Symbicort 160 2 puffs twice daily Lab work today Will prescribe short taper of prednisone starting at 20 mg as patient reports significant improvement when on prednisone >>> I cautioned the patient today that this could actually worsen anxiety which could also be contributing to her symptoms Keep follow-up with our office next week Contact our office at the beginning of next week to let us know how you are doing

## 2018-09-19 NOTE — Progress Notes (Signed)
Discussed results with patient in office.  Nothing further is needed at this time.  Jenesis Martin FNP  

## 2018-09-19 NOTE — Assessment & Plan Note (Signed)
Assessment: Patient is anxious on exam today Patient is managed on Zoloft  Plan: Continue follow-up with primary care Will prescribe hydroxyzine for helping with anxiety as well as postnasal drip Follow-up with primary care for chronic anxiety management

## 2018-09-20 ENCOUNTER — Telehealth: Payer: Self-pay | Admitting: Pulmonary Disease

## 2018-09-20 ENCOUNTER — Other Ambulatory Visit: Payer: Self-pay

## 2018-09-20 DIAGNOSIS — J383 Other diseases of vocal cords: Secondary | ICD-10-CM

## 2018-09-20 LAB — IGE: IgE (Immunoglobulin E), Serum: 17 kU/L (ref <=114)

## 2018-09-20 NOTE — Telephone Encounter (Signed)
Apt canceled and rescheduled for March with Bq. Nothing further needed at this time.

## 2018-09-20 NOTE — Telephone Encounter (Signed)
Result Notes for Comp Met (CMET)   Notes recorded by Karmen Stabs, RN on 09/20/2018 at 3:10 PM EST Relayed results to pt. Pt requested I call her niece with the results and recommenadtions. Niece Ulysees Barns [facility]) at [phone]. Left message for her to cal [place] back in regards to her aunt. ------  Notes recorded by Lauraine Rinne, NP on 09/20/2018 at 2:27 PM EST Allergy lab work is stable. No significant elevations in your IgE or eosinophilia. These of the 2 tests looking to assess for allergic aspect to your breathing.  Blood sugars are once again elevated 328 in our office visit yesterday. Kidney functioning is also reduced. Would recommend that you follow-up with primary care regarding those results as well as endocrinology regarding your blood sugars.  Once again you are not currently seeing any sort of an indicators for why you are having the shortness of breath and wheezing.  I am going to go ahead and place a referral for you to be seen by ENT for evaluation of vocal cord dysfunction. If you already have an ENT then you can proceed forward with seeing them. Or I can refer you to one.    Pt's niece Blanch Media [facility]) is aware of results and voiced her understanding.  Blanch Media stated that pt does not have a ENT. Blanch Media would like to proceed with ENT referral.  Pt has pending OV with Aaron Edelman on 09/28/18. Blanch Media is questioning if pt should keep appt. Referral has been placed to ENT.   Aaron Edelman please advise on APPT. Thanks

## 2018-09-20 NOTE — Progress Notes (Signed)
Allergy lab work is stable.  No significant elevations in your IgE or eosinophilia.  These of the 2 tests looking to assess for allergic aspect to your breathing.  Blood sugars are once again elevated 328 in our office visit yesterday.  Kidney functioning is also reduced.  Would recommend that you follow-up with primary care regarding those results as well as endocrinology regarding your blood sugars.  Once again you are not currently seeing any sort of an indicators for why you are having the shortness of breath and wheezing.  I am going to go ahead and place a referral for you to be seen by ENT for evaluation of vocal cord dysfunction.  If you already have an ENT then you can proceed forward with seeing them.  Or I can refer you to one.   Wyn Quaker FNP

## 2018-09-20 NOTE — Telephone Encounter (Signed)
Could be advantageous to complete follow-up with ENT and then follow-up with our office.  Obviously patient is more than welcome to follow-up sooner if they would like.  Can the patient be scheduled with Dr. Lake Bells at his earliest available?  I believe this is sometime around the beginning of March.  As the patient has not seen him in the outpatient setting yet.  Patient is willing to see Dr. Lake Bells in [facility].  Wyn Quaker FNP

## 2018-09-27 ENCOUNTER — Telehealth: Payer: Self-pay | Admitting: Pulmonary Disease

## 2018-09-27 NOTE — Telephone Encounter (Signed)
appt 10/15/18 arrive at 2:50 pt aware of appt

## 2018-09-27 NOTE — Telephone Encounter (Signed)
Pt was referred to ENT on 09/20/2018 at [facility]. [facility] please advise on status of referral.  Thanks!

## 2018-09-28 ENCOUNTER — Ambulatory Visit: Payer: Medicare Other | Admitting: Pulmonary Disease

## 2018-10-15 ENCOUNTER — Ambulatory Visit: Payer: Medicare Other | Admitting: Cardiology

## 2018-10-18 ENCOUNTER — Ambulatory Visit: Payer: Medicare Other | Admitting: Pulmonary Disease

## 2018-10-18 ENCOUNTER — Encounter: Payer: Self-pay | Admitting: Pulmonary Disease

## 2018-10-18 VITALS — BP 101/66 | HR 81 | Ht 63.0 in | Wt 210.0 lb

## 2018-10-18 DIAGNOSIS — R062 Wheezing: Secondary | ICD-10-CM

## 2018-10-18 NOTE — Patient Instructions (Signed)
Wheezing As we discussed today I believe this is due to your vocal cords being irritated from a recent cold, exacerbated by gastroesophageal reflux disease Continue Symbicort for now as you are doing Use albuterol as needed for chest tightness wheezing or shortness of breath Practice good hand hygiene We will arrange for a lung function test to assess this problem further  Anxiety/postnasal drip: Both of these problems could be treated successfully with a drug called hydroxyzine if this problem of wheezing and postnasal drip occurs again.  Please call me and let me know if that is the case  I will plan on seeing you back in about 6 weeks or sooner if needed

## 2018-10-18 NOTE — Progress Notes (Signed)
Synopsis: Initially seen during a hospital visit in January 2020, while wheezing.  Noted to have no work-up for asthma, ENT feels that she has gastroesophageal reflux disease   Subjective:   PATIENT ID: Carrie White GENDER: female DOB: 1941/07/11, MRN: [id number]   HPI  Chief Complaint  Patient presents with  . Follow-up    F/U per Aaron Edelman for SOB. States she has been feeling much better since seeing Aaron Edelman.    Ms. Wisor says that she is completely better.  She is not sure why.  She no longer has cough, wheezing, or shortness of breath.  She still takes Symbicort.  She says that she never took the Atarax.  She does feel heartburn symptoms daily if she does not take anything like Prilosec.  She was recently told to take it before meals.   Past Medical History:  Diagnosis Date  . Complication of anesthesia   . Depression   . Diabetes mellitus    type II- uncontrolled  . FIBROMYALGIA 05/07/2007  . Fibromyalgia   . GERD (gastroesophageal reflux disease)   . GOITER, MULTINODULAR 07/01/2010  . HYPERLIPIDEMIA 07/31/2008  . HYPERTENSION 08/01/2008  . INSOMNIA, CHRONIC 06/29/2009  . Intraductal papilloma of breast 07/12/2011  . OSTEOARTHRITIS 05/07/2007  . PONV (postoperative nausea and vomiting)   . RESTLESS LEG SYNDROME 05/07/2007     Family History  Problem Relation Age of Onset  . Hypertension Mother   . Stroke Mother        CVA  . Cancer Father        Colon Cancer with Mets, Lung Cancer  . Leukemia Sister   . Diabetes Sister   . Cancer Sister        Breast Cancer  . Hypertension Brother   . Stroke Brother        CVA  . Cancer Brother        Prostate Cancer  . Cancer Sister        Breast Cancer  . Lupus Sister   . Diabetes Brother   . Thyroid disease Other        Brother (1974) thyroid surgery  . Thyroid disease Other        Sister (1981) thyroid surgery      ROS    Objective:  Physical Exam   Vitals:   10/18/18 1623  BP: 101/66  Pulse: 81  SpO2:  96%  Weight: 210 lb (95.3 kg)  Height: '5\' 3"'  (1.6 m)    Gen: obese, anxious HENT: OP clear, TM's clear, neck supple PULM: CTA B, normal percussion CV: RRR, no mgr, trace edema GI: BS+, soft, nontender Derm: no cyanosis or rash Psyche: normal mood and affect  CBC    Component Value Date/Time   WBC 9.9 09/19/2018 1211   RBC 4.47 09/19/2018 1211   HGB 13.1 09/19/2018 1211   HCT 39.6 09/19/2018 1211   PLT 178.0 09/19/2018 1211   MCV 88.5 09/19/2018 1211   MCH 29.2 08/29/2018 0312   MCHC 33.0 09/19/2018 1211   RDW 15.7 (H) 09/19/2018 1211   LYMPHSABS 2.6 09/19/2018 1211   MONOABS 0.8 09/19/2018 1211   EOSABS 0.1 09/19/2018 1211   BASOSABS 0.1 09/19/2018 1211     Chest imaging: January 2020 CT angiogram chest showed no evidence of pulmonary embolism or aortic dissection, atherosclerosis noted.  Images independently reviewed, some trace areas of subsegmental atelectasis in the right base greater than left  PFT: February 2020 spirometry showed no airflow obstruction.  Exhaled  nitric oxide testing: February 2020 23 ppb That Labs: 09/2018 Eos 100 cell/dL, IgE nromal (17  Path:  Echo:  Heart Catheterization:       Assessment & Plan:   Wheezing - Plan: Pulmonary function test  Discussion: Ms. Wunschel has demonstrated very little objective evidence of asthma as she had no airflow obstruction on her lung function testing.  Based on repeated physical exam findings, normal vital signs, normal chest imaging we have a strong index of suspicion that she has vocal cord dysfunction exacerbated by postnasal drip, gastroesophageal reflux disease, and anxiety.  I think would be helpful to have lung function testing to assess further.  Reassuringly blood work showed no evidence of an underlying allergy Plan: Wheezing As we discussed today I believe this is due to your vocal cords being irritated from a recent cold, exacerbated by gastroesophageal reflux disease Continue Symbicort  for now as you are doing Use albuterol as needed for chest tightness wheezing or shortness of breath Practice good hand hygiene We will arrange for a lung function test to assess this problem further  Anxiety/postnasal drip: Both of these problems could be treated successfully with a drug called hydroxyzine if this problem of wheezing and postnasal drip occurs again.  Please call me and let me know if that is the case  I will plan on seeing you back in about 6 weeks or sooner if needed   > 50% of this 27 min visit spent face to face  Current Outpatient Medications:  .  ACCU-CHEK SOFTCLIX LANCETS lancets, Use to test blood sugar 2 times daily as instructed. Dx code: 250.61, Disp: 100 each, Rfl: 10 .  albuterol (PROVENTIL HFA;VENTOLIN HFA) 108 (90 Base) MCG/ACT inhaler, Inhale 1 puff into the lungs every 6 (six) hours as needed for wheezing or shortness of breath., Disp: 1 Inhaler, Rfl: 5 .  aspirin EC 81 MG EC tablet, Take 1 tablet (81 mg total) by mouth daily., Disp: 30 tablet, Rfl: 0 .  BD ULTRA-FINE PEN NEEDLES 29G X 12.7MM MISC, USE DAILY, Disp: 100 each, Rfl: 1 .  BD ULTRA-FINE PEN NEEDLES 29G X 12.7MM MISC, USE DAILY, Disp: 100 each, Rfl: 0 .  benzonatate (TESSALON) 100 MG capsule, Take 1 capsule (100 mg total) by mouth 3 (three) times daily., Disp: 20 capsule, Rfl: 0 .  Blood Glucose Monitoring Suppl (ACCU-CHEK AVIVA PLUS) W/DEVICE KIT, Use to check blood sugar daily. Dx code: 39.61, Disp: 1 kit, Rfl: 0 .  budesonide-formoterol (SYMBICORT) 160-4.5 MCG/ACT inhaler, Inhale 2 puffs into the lungs 2 (two) times daily., Disp: , Rfl:  .  Dulaglutide (TRULICITY) 9.83 JA/2.5KN SOPN, Inject 0.75 mg into the skin once a week., Disp: , Rfl:  .  ferrous sulfate 325 (65 FE) MG tablet, Take 325 mg by mouth daily with breakfast., Disp: , Rfl:  .  furosemide (LASIX) 20 MG tablet, Take 40 mg by mouth daily., Disp: , Rfl:  .  gabapentin (NEURONTIN) 100 MG capsule, Take 300 mg by mouth 2 (two) times  daily., Disp: , Rfl:  .  glimepiride (AMARYL) 4 MG tablet, Take 4 mg by mouth 2 (two) times daily., Disp: , Rfl:  .  glucose blood (ACCU-CHEK AVIVA PLUS) test strip, Use to test blood sugar 2 times daily as instructed. Dx code: 250.61, Disp: 100 each, Rfl: 10 .  Glucose Blood (BAYER BREEZE 2 TEST) DISK, TEST 2 TIMES PER DAY, Disp: 200 each, Rfl: 3 .  hydrOXYzine (VISTARIL) 25 MG capsule, Can take 1 or  2 tablets every 6 hours as needed for anxiety. This medication is sedating., Disp: 60 capsule, Rfl: 3 .  Insulin Lispro Prot & Lispro (HUMALOG MIX 75/25 KWIKPEN) (75-25) 100 UNIT/ML Kwikpen, Inject 50 Units into the skin 2 (two) times daily., Disp: , Rfl:  .  ipratropium-albuterol (DUONEB) 0.5-2.5 (3) MG/3ML SOLN, Take 3 mLs by nebulization every 6 (six) hours., Disp: 360 mL, Rfl: 11 .  meloxicam (MOBIC) 7.5 MG tablet, Take 7.5 mg by mouth daily., Disp: , Rfl:  .  metFORMIN (GLUCOPHAGE-XR) 500 MG 24 hr tablet, Take 1,000 mg by mouth at bedtime., Disp: , Rfl:  .  montelukast (SINGULAIR) 10 MG tablet, Take 10 mg by mouth at bedtime., Disp: , Rfl:  .  omeprazole (PRILOSEC) 40 MG capsule, Take 1 capsule (40 mg total) by mouth daily. PT NEEDS NEW PCP FOR FURTHER REFILLS DR [name] RETIRED (Patient taking differently: Take 40 mg by mouth daily. ), Disp: 30 capsule, Rfl: 5 .  ranolazine (RANEXA) 500 MG 12 hr tablet, Take 1 tablet (500 mg total) by mouth 2 (two) times daily., Disp: 60 tablet, Rfl: 2 .  rOPINIRole (REQUIP) 4 MG tablet, Take 4 mg by mouth at bedtime. , Disp: , Rfl:  .  sertraline (ZOLOFT) 50 MG tablet, Take 1 tablet (50 mg total) by mouth daily. (Patient taking differently: Take 100 mg by mouth daily. ), Disp: 30 tablet, Rfl: 0 .  simvastatin (ZOCOR) 40 MG tablet, Take 1 tablet (40 mg total) by mouth at bedtime. PT NEEDS NEW PCP FOR FURTHER REFILLS DR [name] RETIRED (Patient taking differently: Take 40 mg by mouth at bedtime. ), Disp: 30 tablet, Rfl: 5 .  Spacer/Aero-Holding Chambers (AEROCHAMBER  MV) inhaler, Use as instructed, Disp: 1 each, Rfl: 0 .  traZODone (DESYREL) 100 MG tablet, Take 100-200 mg by mouth at bedtime., Disp: , Rfl:

## 2018-10-25 ENCOUNTER — Ambulatory Visit: Payer: Medicare Other | Admitting: Cardiology

## 2018-10-29 ENCOUNTER — Ambulatory Visit: Payer: Medicare Other | Admitting: Cardiology

## 2018-12-05 ENCOUNTER — Telehealth: Payer: Medicare Other | Admitting: Cardiology

## 2019-04-04 IMAGING — CR DG CHEST 2V
2 series · 2 of 2 positions shown · non-contrast
Comparison: 08/13/2018

CLINICAL DATA: Cough and cold symptoms for several weeks

EXAM:
CHEST - 2 VIEW

[chest lat]
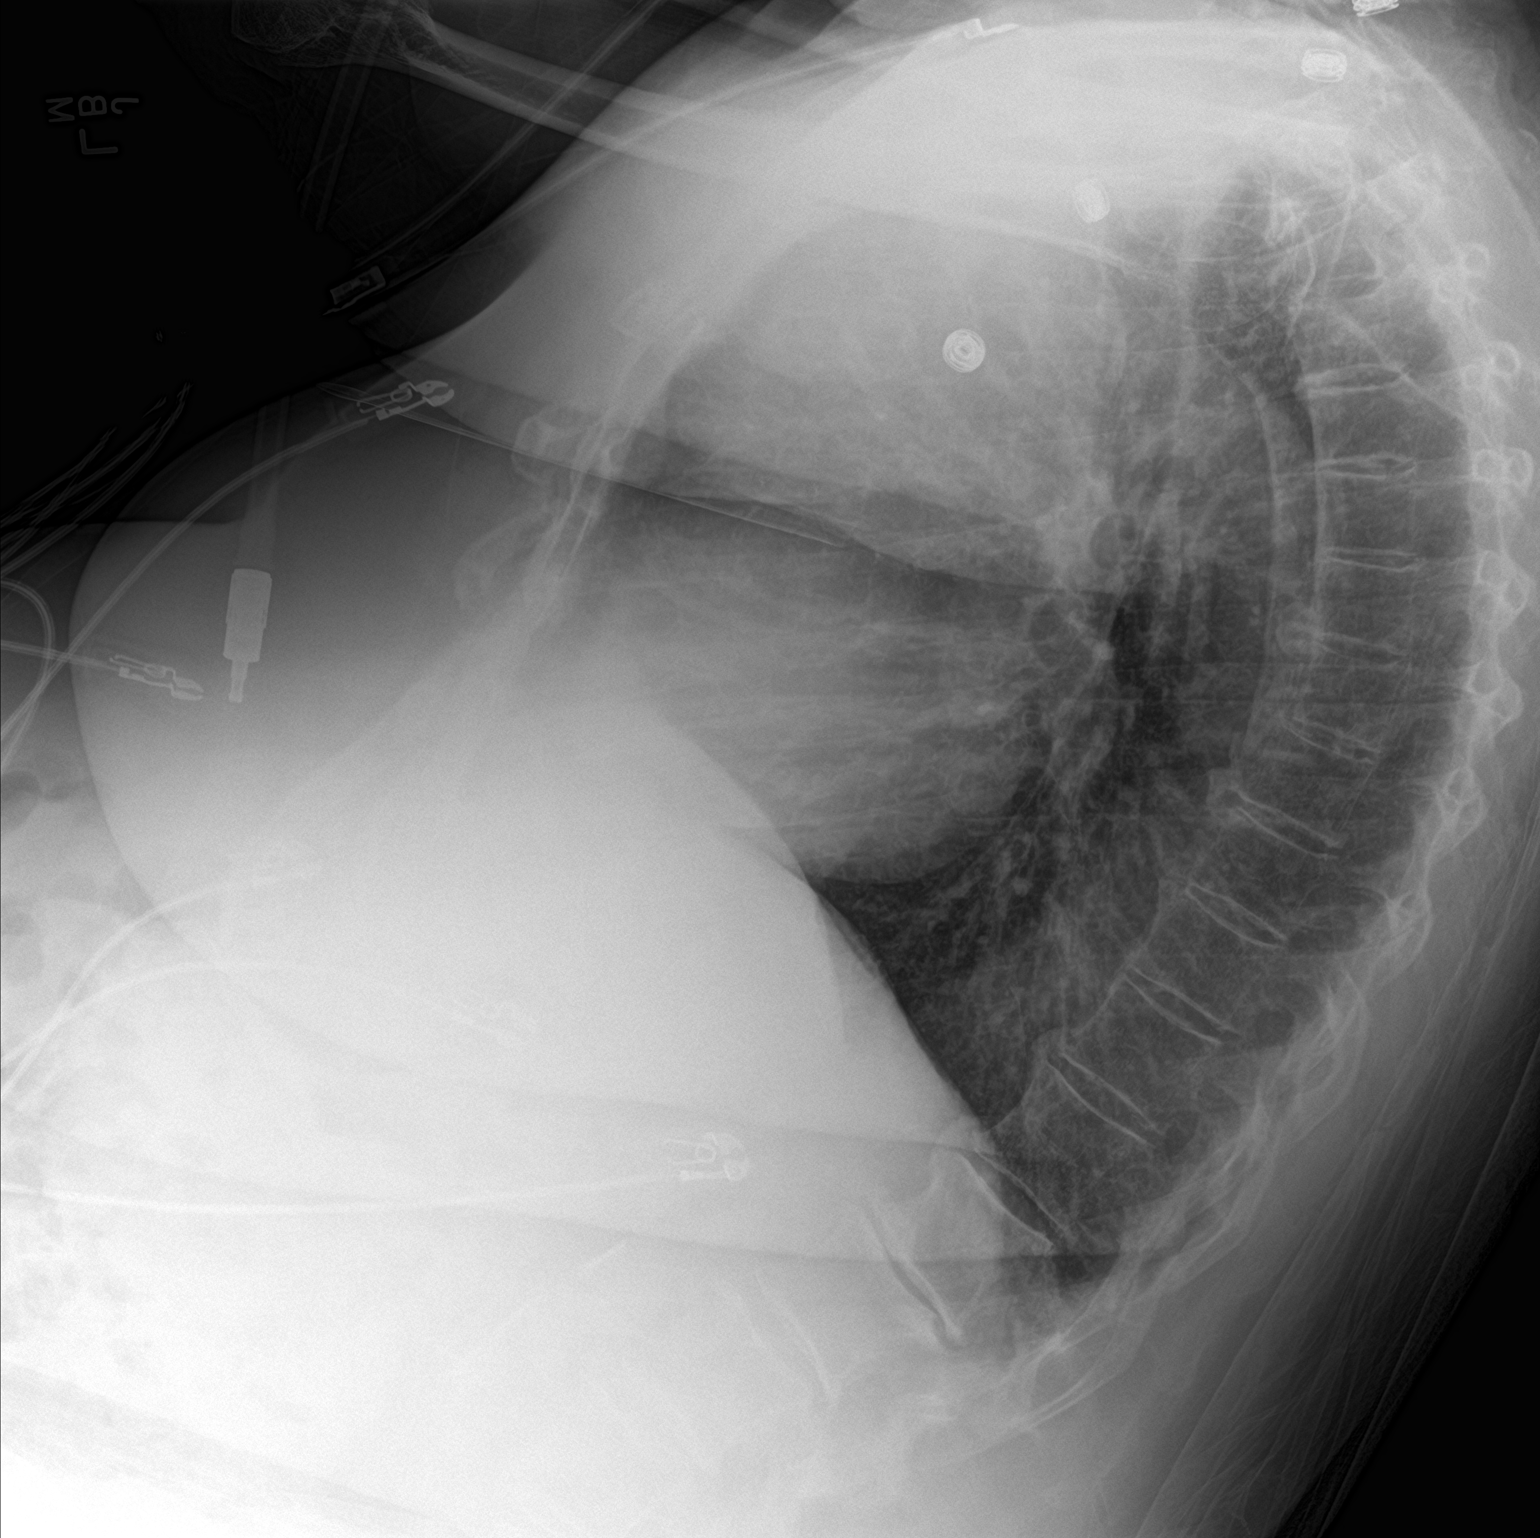

[chest ap]
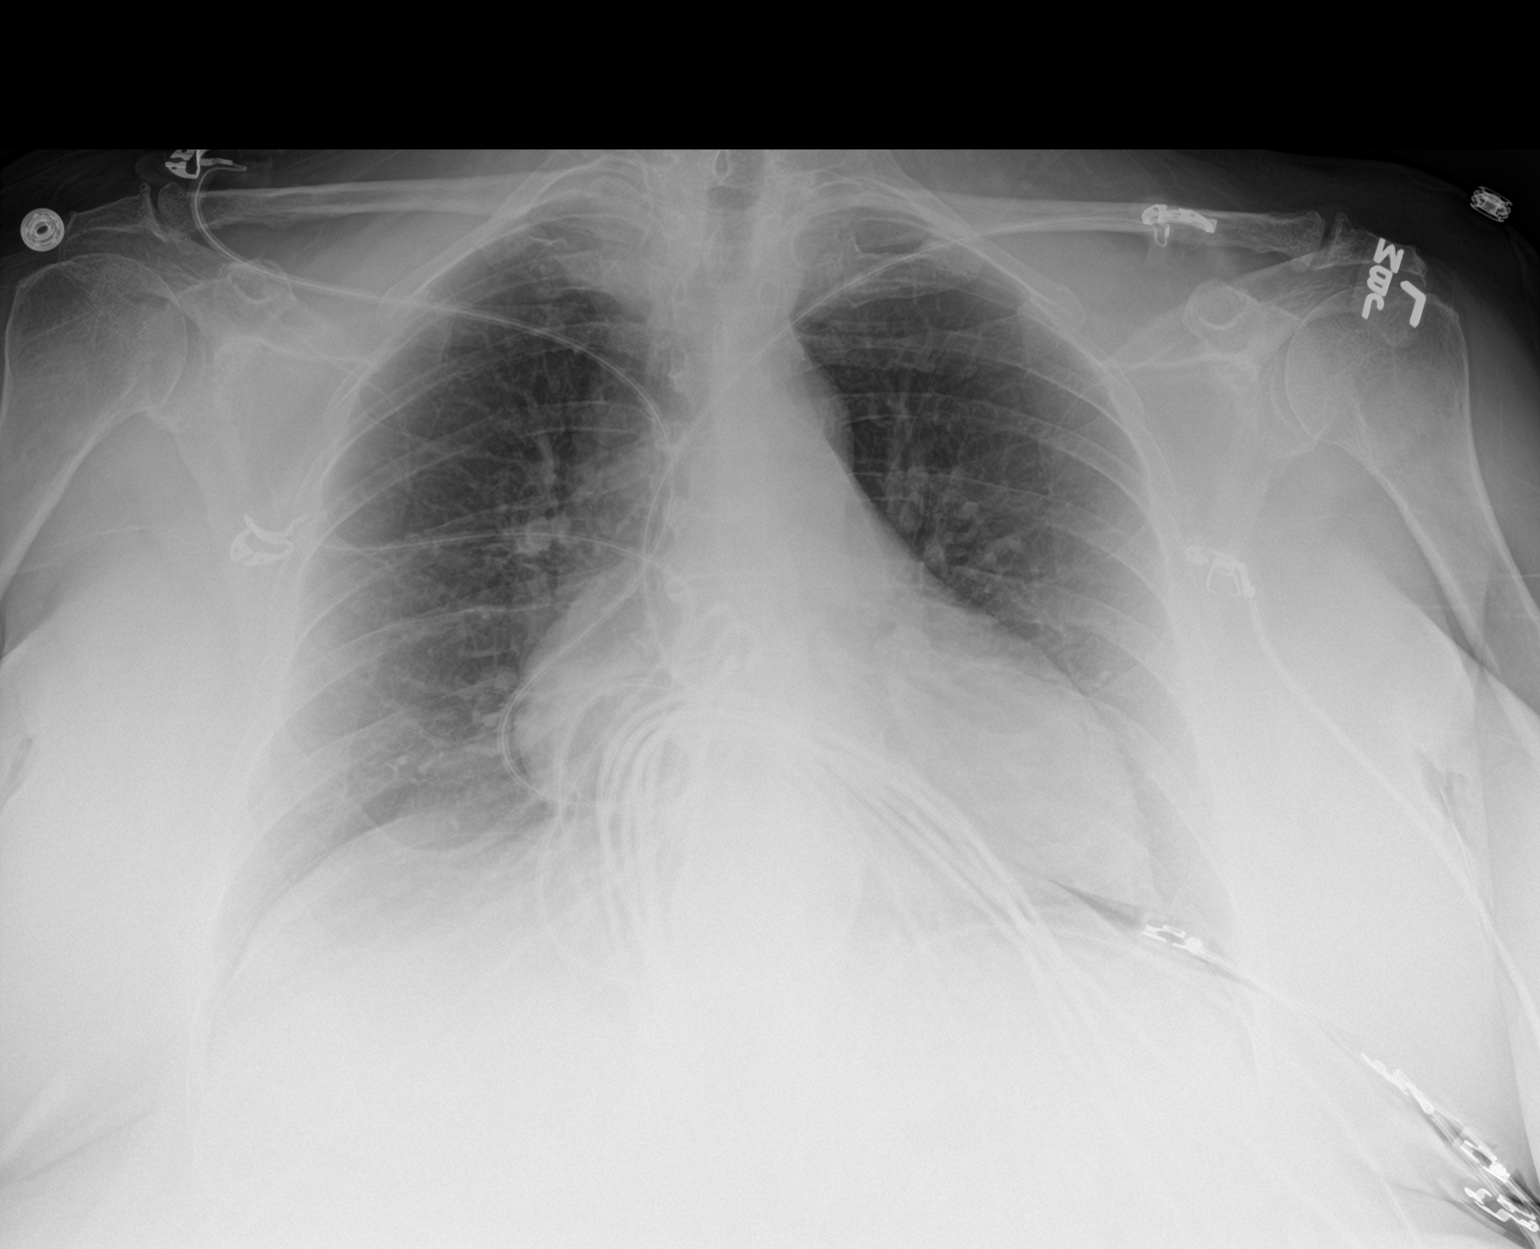

[2 of 2 positions shown; findings below may reference images not displayed]

FINDINGS: Cardiac shadow is mildly enlarged. The lungs are clear bilaterally.
No sizable effusion is seen. Degenerative changes of the thoracic
spine are noted.
IMPRESSION: No active cardiopulmonary disease.

## 2019-09-03 ENCOUNTER — Encounter (HOSPITAL_COMMUNITY): Payer: Self-pay

## 2019-09-03 ENCOUNTER — Other Ambulatory Visit: Payer: Self-pay

## 2019-09-03 ENCOUNTER — Emergency Department (HOSPITAL_COMMUNITY): Payer: Medicare Other

## 2019-09-03 ENCOUNTER — Inpatient Hospital Stay (HOSPITAL_COMMUNITY)
Admission: EM | Admit: 2019-09-03 | Discharge: 2019-09-05 | DRG: 202 | Disposition: A | Discharge status: Home Health | Payer: Medicare Other | Attending: Student in an Organized Health Care Education/Training Program | Admitting: Student in an Organized Health Care Education/Training Program

## 2019-09-03 DIAGNOSIS — F329 Major depressive disorder, single episode, unspecified: Secondary | ICD-10-CM | POA: Diagnosis present

## 2019-09-03 DIAGNOSIS — R1110 Vomiting, unspecified: Secondary | ICD-10-CM | POA: Diagnosis present

## 2019-09-03 DIAGNOSIS — Z8701 Personal history of pneumonia (recurrent): Secondary | ICD-10-CM

## 2019-09-03 DIAGNOSIS — J208 Acute bronchitis due to other specified organisms: Principal | ICD-10-CM | POA: Diagnosis present

## 2019-09-03 DIAGNOSIS — J22 Unspecified acute lower respiratory infection: Secondary | ICD-10-CM | POA: Diagnosis present

## 2019-09-03 DIAGNOSIS — Z6836 Body mass index (BMI) 36.0-36.9, adult: Secondary | ICD-10-CM | POA: Diagnosis not present

## 2019-09-03 DIAGNOSIS — Z8249 Family history of ischemic heart disease and other diseases of the circulatory system: Secondary | ICD-10-CM

## 2019-09-03 DIAGNOSIS — Z800 Family history of malignant neoplasm of digestive organs: Secondary | ICD-10-CM | POA: Diagnosis not present

## 2019-09-03 DIAGNOSIS — Z7982 Long term (current) use of aspirin: Secondary | ICD-10-CM

## 2019-09-03 DIAGNOSIS — E049 Nontoxic goiter, unspecified: Secondary | ICD-10-CM | POA: Diagnosis present

## 2019-09-03 DIAGNOSIS — Z87891 Personal history of nicotine dependence: Secondary | ICD-10-CM

## 2019-09-03 DIAGNOSIS — M1990 Unspecified osteoarthritis, unspecified site: Secondary | ICD-10-CM | POA: Diagnosis not present

## 2019-09-03 DIAGNOSIS — I2510 Atherosclerotic heart disease of native coronary artery without angina pectoris: Secondary | ICD-10-CM | POA: Diagnosis present

## 2019-09-03 DIAGNOSIS — Z803 Family history of malignant neoplasm of breast: Secondary | ICD-10-CM

## 2019-09-03 DIAGNOSIS — E1169 Type 2 diabetes mellitus with other specified complication: Secondary | ICD-10-CM | POA: Diagnosis present

## 2019-09-03 DIAGNOSIS — E785 Hyperlipidemia, unspecified: Secondary | ICD-10-CM | POA: Diagnosis present

## 2019-09-03 DIAGNOSIS — D72829 Elevated white blood cell count, unspecified: Secondary | ICD-10-CM | POA: Diagnosis not present

## 2019-09-03 DIAGNOSIS — Z833 Family history of diabetes mellitus: Secondary | ICD-10-CM | POA: Diagnosis not present

## 2019-09-03 DIAGNOSIS — R197 Diarrhea, unspecified: Secondary | ICD-10-CM | POA: Diagnosis present

## 2019-09-03 DIAGNOSIS — Z801 Family history of malignant neoplasm of trachea, bronchus and lung: Secondary | ICD-10-CM | POA: Diagnosis not present

## 2019-09-03 DIAGNOSIS — I110 Hypertensive heart disease with heart failure: Secondary | ICD-10-CM | POA: Diagnosis not present

## 2019-09-03 DIAGNOSIS — Z823 Family history of stroke: Secondary | ICD-10-CM

## 2019-09-03 DIAGNOSIS — Z20822 Contact with and (suspected) exposure to covid-19: Secondary | ICD-10-CM | POA: Diagnosis not present

## 2019-09-03 DIAGNOSIS — Z79899 Other long term (current) drug therapy: Secondary | ICD-10-CM

## 2019-09-03 DIAGNOSIS — K219 Gastro-esophageal reflux disease without esophagitis: Secondary | ICD-10-CM | POA: Diagnosis present

## 2019-09-03 DIAGNOSIS — E872 Acidosis: Secondary | ICD-10-CM | POA: Diagnosis not present

## 2019-09-03 DIAGNOSIS — Z806 Family history of leukemia: Secondary | ICD-10-CM | POA: Diagnosis not present

## 2019-09-03 DIAGNOSIS — E669 Obesity, unspecified: Secondary | ICD-10-CM | POA: Diagnosis not present

## 2019-09-03 DIAGNOSIS — J45909 Unspecified asthma, uncomplicated: Secondary | ICD-10-CM | POA: Diagnosis present

## 2019-09-03 DIAGNOSIS — I5032 Chronic diastolic (congestive) heart failure: Secondary | ICD-10-CM | POA: Diagnosis not present

## 2019-09-03 DIAGNOSIS — Z96653 Presence of artificial knee joint, bilateral: Secondary | ICD-10-CM | POA: Diagnosis present

## 2019-09-03 DIAGNOSIS — Z66 Do not resuscitate: Secondary | ICD-10-CM | POA: Diagnosis not present

## 2019-09-03 DIAGNOSIS — Z794 Long term (current) use of insulin: Secondary | ICD-10-CM

## 2019-09-03 DIAGNOSIS — Z832 Family history of diseases of the blood and blood-forming organs and certain disorders involving the immune mechanism: Secondary | ICD-10-CM

## 2019-09-03 DIAGNOSIS — R0600 Dyspnea, unspecified: Secondary | ICD-10-CM

## 2019-09-03 DIAGNOSIS — J9600 Acute respiratory failure, unspecified whether with hypoxia or hypercapnia: Secondary | ICD-10-CM | POA: Diagnosis present

## 2019-09-03 DIAGNOSIS — R6510 Systemic inflammatory response syndrome (SIRS) of non-infectious origin without acute organ dysfunction: Secondary | ICD-10-CM

## 2019-09-03 LAB — FIBRINOGEN: Fibrinogen: 590 mg/dL — ABNORMAL HIGH (ref 210–475)

## 2019-09-03 LAB — CBC WITH DIFFERENTIAL/PLATELET
Abs Immature Granulocytes: 0.05 10*3/uL (ref 0.00–0.07)
Basophils Absolute: 0 10*3/uL (ref 0.0–0.1)
Basophils Relative: 0 %
Eosinophils Absolute: 0.2 10*3/uL (ref 0.0–0.5)
Eosinophils Relative: 1 %
HCT: 39 % (ref 36.0–46.0)
Hemoglobin: 13.3 g/dL (ref 12.0–15.0)
Immature Granulocytes: 0 %
Lymphocytes Relative: 11 %
Lymphs Abs: 1.4 10*3/uL (ref 0.7–4.0)
MCH: 30.9 pg (ref 26.0–34.0)
MCHC: 34.1 g/dL (ref 30.0–36.0)
MCV: 90.7 fL (ref 80.0–100.0)
Monocytes Absolute: 1 10*3/uL (ref 0.1–1.0)
Monocytes Relative: 8 %
Neutro Abs: 10.1 10*3/uL — ABNORMAL HIGH (ref 1.7–7.7)
Neutrophils Relative %: 80 %
Platelets: 205 10*3/uL (ref 150–400)
RBC: 4.3 MIL/uL (ref 3.87–5.11)
RDW: 12.3 % (ref 11.5–15.5)
WBC: 12.7 10*3/uL — ABNORMAL HIGH (ref 4.0–10.5)
nRBC: 0 % (ref 0.0–0.2)

## 2019-09-03 LAB — HEMOGLOBIN A1C
Hgb A1c MFr Bld: 9.9 % — ABNORMAL HIGH (ref 4.8–5.6)
Mean Plasma Glucose: 237.43 mg/dL

## 2019-09-03 LAB — COMPREHENSIVE METABOLIC PANEL
ALT: 26 U/L (ref 0–44)
AST: 20 U/L (ref 15–41)
Albumin: 3.4 g/dL — ABNORMAL LOW (ref 3.5–5.0)
Alkaline Phosphatase: 88 U/L (ref 38–126)
Anion gap: 11 (ref 5–15)
BUN: 9 mg/dL (ref 8–23)
CO2: 26 mmol/L (ref 22–32)
Calcium: 8.9 mg/dL (ref 8.9–10.3)
Chloride: 96 mmol/L — ABNORMAL LOW (ref 98–111)
Creatinine, Ser: 0.86 mg/dL (ref 0.44–1.00)
GFR calc Af Amer: >60 mL/min (ref >60)
GFR calc non Af Amer: >60 mL/min (ref >60)
Glucose, Bld: 348 mg/dL — ABNORMAL HIGH (ref 70–99)
Potassium: 4.1 mmol/L (ref 3.5–5.1)
Sodium: 133 mmol/L — ABNORMAL LOW (ref 135–145)
Total Bilirubin: 0.6 mg/dL (ref 0.3–1.2)
Total Protein: 6.6 g/dL (ref 6.5–8.1)

## 2019-09-03 LAB — LACTIC ACID, PLASMA
Lactic Acid, Venous: 2.7 mmol/L (ref 0.5–1.9)
Lactic Acid, Venous: 2.1 mmol/L (ref 0.5–1.9)

## 2019-09-03 LAB — RESPIRATORY PANEL BY RT PCR (FLU A&B, COVID)
Influenza A by PCR: NEGATIVE
Influenza B by PCR: NEGATIVE
SARS Coronavirus 2 by RT PCR: NEGATIVE

## 2019-09-03 LAB — D-DIMER, QUANTITATIVE: D-Dimer, Quant: 1.48 ug/mL-FEU — ABNORMAL HIGH (ref 0.00–0.50)

## 2019-09-03 LAB — GROUP A STREP BY PCR: Group A Strep by PCR: NOT DETECTED

## 2019-09-03 LAB — C-REACTIVE PROTEIN: CRP: 9.6 mg/dL — ABNORMAL HIGH (ref <1.0)

## 2019-09-03 LAB — FERRITIN: Ferritin: 144 ng/mL (ref 11–307)

## 2019-09-03 LAB — GLUCOSE, CAPILLARY: Glucose-Capillary: 314 mg/dL — ABNORMAL HIGH (ref 70–99)

## 2019-09-03 LAB — CBG MONITORING, ED: Glucose-Capillary: 464 mg/dL — ABNORMAL HIGH (ref 70–99)

## 2019-09-03 LAB — PROCALCITONIN: Procalcitonin: <0.1 ng/mL

## 2019-09-03 LAB — LACTATE DEHYDROGENASE: LDH: 166 U/L (ref 98–192)

## 2019-09-03 LAB — TRIGLYCERIDES: Triglycerides: 138 mg/dL (ref <150)

## 2019-09-03 MED ORDER — GUAIFENESIN-DM 100-10 MG/5ML PO SYRP
5.0000 mL | ORAL_SOLUTION | ORAL | Status: DC | PRN
  Administered 2019-09-03 – 2019-09-05 (×3): 5 mL via ORAL
  Filled 2019-09-03 (×3): qty 5

## 2019-09-03 MED ORDER — TRAZODONE HCL 100 MG PO TABS
100.0000 mg | ORAL_TABLET | Freq: Every day | ORAL | Status: DC
  Administered 2019-09-03 – 2019-09-04 (×2): 100 mg via ORAL
  Filled 2019-09-03 (×2): qty 1

## 2019-09-03 MED ORDER — ALBUTEROL SULFATE (2.5 MG/3ML) 0.083% IN NEBU: 3.0000 mL | INHALATION_SOLUTION | Freq: Four times a day (QID) | RESPIRATORY_TRACT | Status: DC | PRN

## 2019-09-03 MED ORDER — ALBUTEROL SULFATE HFA 108 (90 BASE) MCG/ACT IN AERS
4.0000 | INHALATION_SPRAY | RESPIRATORY_TRACT | Status: DC | PRN
  Filled 2019-09-03: qty 6.7

## 2019-09-03 MED ORDER — SODIUM CHLORIDE 0.9 % IV SOLN
1.0000 g | Freq: Once | INTRAVENOUS | Status: AC
  Administered 2019-09-03: 16:00:00 1 g via INTRAVENOUS
  Filled 2019-09-03: qty 10

## 2019-09-03 MED ORDER — IPRATROPIUM-ALBUTEROL 0.5-2.5 (3) MG/3ML IN SOLN
3.0000 mL | Freq: Four times a day (QID) | RESPIRATORY_TRACT | Status: DC
  Administered 2019-09-03: 3 mL via RESPIRATORY_TRACT
  Filled 2019-09-03 (×2): qty 3

## 2019-09-03 MED ORDER — BENZONATATE 100 MG PO CAPS
100.0000 mg | ORAL_CAPSULE | Freq: Three times a day (TID) | ORAL | Status: DC
  Administered 2019-09-03 – 2019-09-05 (×5): 100 mg via ORAL
  Filled 2019-09-03 (×5): qty 1

## 2019-09-03 MED ORDER — MOMETASONE FURO-FORMOTEROL FUM 200-5 MCG/ACT IN AERO
2.0000 | INHALATION_SPRAY | Freq: Two times a day (BID) | RESPIRATORY_TRACT | Status: DC
  Administered 2019-09-04: 2 via RESPIRATORY_TRACT
  Filled 2019-09-03: qty 8.8

## 2019-09-03 MED ORDER — IPRATROPIUM-ALBUTEROL 0.5-2.5 (3) MG/3ML IN SOLN
3.0000 mL | Freq: Three times a day (TID) | RESPIRATORY_TRACT | Status: DC
  Administered 2019-09-04 – 2019-09-05 (×4): 3 mL via RESPIRATORY_TRACT
  Filled 2019-09-03 (×4): qty 3

## 2019-09-03 MED ORDER — INSULIN GLARGINE 100 UNIT/ML ~~LOC~~ SOLN
40.0000 [IU] | Freq: Every day | SUBCUTANEOUS | Status: DC
  Administered 2019-09-03 – 2019-09-04 (×2): 40 [IU] via SUBCUTANEOUS
  Filled 2019-09-03 (×3): qty 0.4

## 2019-09-03 MED ORDER — INSULIN ASPART 100 UNIT/ML ~~LOC~~ SOLN
10.0000 [IU] | Freq: Three times a day (TID) | SUBCUTANEOUS | Status: DC
  Administered 2019-09-04 – 2019-09-05 (×3): 10 [IU] via SUBCUTANEOUS

## 2019-09-03 MED ORDER — SODIUM CHLORIDE 0.9 % IV SOLN: Freq: Once | INTRAVENOUS | Status: AC

## 2019-09-03 MED ORDER — SODIUM CHLORIDE 0.9 % IV BOLUS
500.0000 mL | Freq: Once | INTRAVENOUS | Status: AC
  Administered 2019-09-03: 500 mL via INTRAVENOUS

## 2019-09-03 MED ORDER — RANOLAZINE ER 500 MG PO TB12: 500.0000 mg | ORAL_TABLET | Freq: Two times a day (BID) | ORAL | Status: DC

## 2019-09-03 MED ORDER — MORPHINE SULFATE (PF) 2 MG/ML IV SOLN
2.0000 mg | INTRAVENOUS | Status: DC | PRN
  Administered 2019-09-03 – 2019-09-04 (×4): 2 mg via INTRAVENOUS
  Filled 2019-09-03 (×4): qty 1

## 2019-09-03 MED ORDER — ROPINIROLE HCL 1 MG PO TABS
4.0000 mg | ORAL_TABLET | Freq: Every day | ORAL | Status: DC
  Administered 2019-09-04 (×2): 4 mg via ORAL
  Filled 2019-09-03 (×3): qty 4

## 2019-09-03 MED ORDER — INSULIN GLARGINE 100 UNIT/ML ~~LOC~~ SOLN: 30.0000 [IU] | Freq: Every day | SUBCUTANEOUS | Status: DC

## 2019-09-03 MED ORDER — INSULIN ASPART 100 UNIT/ML ~~LOC~~ SOLN
10.0000 [IU] | Freq: Once | SUBCUTANEOUS | Status: AC
  Administered 2019-09-03: 18:00:00 10 [IU] via SUBCUTANEOUS

## 2019-09-03 MED ORDER — ENOXAPARIN SODIUM 40 MG/0.4ML ~~LOC~~ SOLN
40.0000 mg | SUBCUTANEOUS | Status: DC
  Administered 2019-09-03 – 2019-09-04 (×2): 40 mg via SUBCUTANEOUS
  Filled 2019-09-03 (×2): qty 0.4

## 2019-09-03 MED ORDER — PANTOPRAZOLE SODIUM 40 MG PO TBEC
40.0000 mg | DELAYED_RELEASE_TABLET | Freq: Every day | ORAL | Status: DC
  Administered 2019-09-04 – 2019-09-05 (×2): 40 mg via ORAL
  Filled 2019-09-03 (×2): qty 1

## 2019-09-03 MED ORDER — HYDROXYZINE PAMOATE 50 MG PO CAPS
50.0000 mg | ORAL_CAPSULE | Freq: Four times a day (QID) | ORAL | Status: DC | PRN
  Filled 2019-09-03: qty 1

## 2019-09-03 MED ORDER — SERTRALINE HCL 50 MG PO TABS
50.0000 mg | ORAL_TABLET | Freq: Every day | ORAL | Status: DC
  Administered 2019-09-04 – 2019-09-05 (×2): 50 mg via ORAL
  Filled 2019-09-03 (×2): qty 1

## 2019-09-03 MED ORDER — INSULIN ASPART 100 UNIT/ML ~~LOC~~ SOLN
0.0000 [IU] | Freq: Three times a day (TID) | SUBCUTANEOUS | Status: DC
  Administered 2019-09-04: 5 [IU] via SUBCUTANEOUS
  Administered 2019-09-04: 3 [IU] via SUBCUTANEOUS
  Administered 2019-09-04 – 2019-09-05 (×2): 8 [IU] via SUBCUTANEOUS

## 2019-09-03 MED ORDER — SIMVASTATIN 20 MG PO TABS
40.0000 mg | ORAL_TABLET | Freq: Every day | ORAL | Status: DC
  Administered 2019-09-04 (×2): 40 mg via ORAL
  Filled 2019-09-03 (×2): qty 2

## 2019-09-03 MED ORDER — INSULIN ASPART 100 UNIT/ML ~~LOC~~ SOLN
0.0000 [IU] | Freq: Every day | SUBCUTANEOUS | Status: DC
  Administered 2019-09-03 – 2019-09-04 (×2): 4 [IU] via SUBCUTANEOUS

## 2019-09-03 MED ORDER — SODIUM CHLORIDE 0.9 % IV SOLN
500.0000 mg | Freq: Once | INTRAVENOUS | Status: AC
  Administered 2019-09-03: 500 mg via INTRAVENOUS
  Filled 2019-09-03: qty 500

## 2019-09-03 MED ORDER — GABAPENTIN 300 MG PO CAPS
300.0000 mg | ORAL_CAPSULE | Freq: Two times a day (BID) | ORAL | Status: DC
  Administered 2019-09-03 – 2019-09-05 (×4): 300 mg via ORAL
  Filled 2019-09-03 (×4): qty 1

## 2019-09-03 MED ORDER — MONTELUKAST SODIUM 10 MG PO TABS
10.0000 mg | ORAL_TABLET | Freq: Every day | ORAL | Status: DC
  Administered 2019-09-04 (×2): 10 mg via ORAL
  Filled 2019-09-03 (×3): qty 1

## 2019-09-03 MED ORDER — PHENOL 1.4 % MT LIQD
1.0000 | OROMUCOSAL | Status: DC | PRN
  Filled 2019-09-03 (×2): qty 177

## 2019-09-03 MED ORDER — ACETAMINOPHEN 500 MG PO TABS
1000.0000 mg | ORAL_TABLET | Freq: Once | ORAL | Status: AC
  Administered 2019-09-03: 14:00:00 1000 mg via ORAL
  Filled 2019-09-03: qty 2

## 2019-09-03 MED ORDER — IPRATROPIUM-ALBUTEROL 0.5-2.5 (3) MG/3ML IN SOLN: 3.0000 mL | Freq: Four times a day (QID) | RESPIRATORY_TRACT | Status: DC

## 2019-09-03 MED ORDER — ASPIRIN EC 81 MG PO TBEC
81.0000 mg | DELAYED_RELEASE_TABLET | Freq: Every day | ORAL | Status: DC
  Administered 2019-09-04 – 2019-09-05 (×2): 81 mg via ORAL
  Filled 2019-09-03 (×2): qty 1

## 2019-09-03 NOTE — ED Notes (Signed)
Pt given dinner tray.

## 2019-09-03 NOTE — ED Provider Notes (Addendum)
[facility] EMERGENCY DEPARTMENT Provider Note   CSN: [id number] Arrival date & time: 09/03/19  1200     History Chief Complaint  Patient presents with  . Cough  . Sore Throat  . Nausea  . Diarrhea    Carrie White is a 79 y.o. female.  HPI Patient reports she got her first Covid immunization approximately a week ago.  She has not had any exposures that she is aware of.  She reports that starting yesterday she started getting a very sore throat which then progressed into body aches and cough.  She reports now she is also had nausea and several episodes of loose stool.  She did try an Imodium prior to coming to the emergency department.  She reports she still however has a very sore throat and her fever went up to 101.  She reports the cough has become rather harsh and wet and she hurts in her chest when she coughs.  Patient reports she feels generally extremely fatigued as well.    Past Medical History:  Diagnosis Date  . Complication of anesthesia   . Depression   . Diabetes mellitus    type II- uncontrolled  . FIBROMYALGIA 05/07/2007  . Fibromyalgia   . GERD (gastroesophageal reflux disease)   . GOITER, MULTINODULAR 07/01/2010  . HYPERLIPIDEMIA 07/31/2008  . HYPERTENSION 08/01/2008  . INSOMNIA, CHRONIC 06/29/2009  . Intraductal papilloma of breast 07/12/2011  . OSTEOARTHRITIS 05/07/2007  . PONV (postoperative nausea and vomiting)   . RESTLESS LEG SYNDROME 05/07/2007    Patient Active Problem List   Diagnosis Date Noted  . Dyspnea on exertion 09/19/2018  . Chronic diastolic congestive heart failure ([place]) 09/13/2018  . Precordial chest pain 09/13/2018  . Coronary artery disease involving native coronary artery of native heart with angina pectoris ([place]) 09/13/2018  . Generalized edema 09/10/2018  . Chronic obstructive asthma with acute exacerbation of asthma ([place]) 08/24/2018  . Restless leg syndrome 10/11/2013  . Depression 10/03/2013  . Anxiety  10/03/2013  . Knee pain, left 09/29/2013  . Arthritis of left knee 09/09/2013  . Type I (juvenile type) diabetes mellitus with neurological manifestations, not stated as uncontrolled(250.61) 06/27/2013  . De Quervain's tenosynovitis, bilateral 06/26/2013  . Osteoarthritis, knee 06/05/2013  . Lateral epicondylitis (tennis elbow) 06/05/2013  . Routine health maintenance 10/23/2012  . Grief 10/22/2012  . GOITER, MULTINODULAR 07/01/2010  . DYSURIA 10/30/2009  . VAGINITIS, CANDIDAL 10/28/2009  . DIARRHEA, PERSISTENT 09/25/2009  . INSOMNIA, CHRONIC 06/29/2009  . UNS ADVRS EFF UNS RX MEDICINAL&BIOLOGICAL SBSTNC 02/24/2009  . DM (diabetes mellitus), type 2, uncontrolled ([place]) 08/01/2008  . Essential hypertension 08/01/2008  . Type 2 diabetes mellitus with hyperlipidemia ([place]) 07/31/2008  . RESTLESS LEG SYNDROME 05/07/2007  . OSTEOARTHRITIS 05/07/2007  . FIBROMYALGIA 05/07/2007    Past Surgical History:  Procedure Laterality Date  . ABDOMINAL HYSTERECTOMY    . BREAST MASS EXCISION  07/18/11   left breast  . BREAST SURGERY    . COLONOSCOPY    . [facility] [facility] OF UTERUS  1972  . MASTECTOMY, PARTIAL  07/18/2011  . MOUTH SURGERY  5027,7412  . OOPHORECTOMY    . TOTAL KNEE ARTHROPLASTY Right 09/09/2013   DR Mayer Camel  . TOTAL KNEE ARTHROPLASTY Left 09/09/2013   Procedure: TOTAL KNEE ARTHROPLASTY- left;  Surgeon: Kerin Salen, MD;  Location: [facility];  Service: Orthopedics;  Laterality: Left;     OB History   No obstetric history on file.     Family  History  Problem Relation Age of Onset  . Hypertension Mother   . Stroke Mother        CVA  . Cancer Father        Colon Cancer with Mets, Lung Cancer  . Leukemia Sister   . Diabetes Sister   . Cancer Sister        Breast Cancer  . Hypertension Brother   . Stroke Brother        CVA  . Cancer Brother        Prostate Cancer  . Cancer Sister        Breast Cancer  . Lupus Sister   . Diabetes Brother   . Thyroid disease Other          Brother (1974) thyroid surgery  . Thyroid disease Other        Sister (1981) thyroid surgery    Social History   Tobacco Use  . Smoking status: Former Smoker    Types: Cigarettes    Quit date: 10/28/1968    Years since quitting: 50.8  . Smokeless tobacco: Never Used  . Tobacco comment: only smoke 1 pack per Month when she did smoke more socially 09/10/18  Substance Use Topics  . Alcohol use: No  . Drug use: No    Home Medications Prior to Admission medications   Medication Sig Start Date End Date Taking? Authorizing Provider  ACCU-CHEK SOFTCLIX LANCETS lancets Use to test blood sugar 2 times daily as instructed. Dx code: 250.61 01/28/14   Renato Shin, MD  albuterol (PROVENTIL HFA;VENTOLIN HFA) 108 (90 Base) MCG/ACT inhaler Inhale 1 puff into the lungs every 6 (six) hours as needed for wheezing or shortness of breath. 09/13/18   Lauraine Rinne, NP  aspirin EC 81 MG EC tablet Take 1 tablet (81 mg total) by mouth daily. 08/31/18   Lavina Hamman, MD  BD ULTRA-FINE PEN NEEDLES 29G X 12.7MM MISC USE DAILY 10/07/14   Renato Shin, MD  BD ULTRA-FINE PEN NEEDLES 29G X 12.7MM MISC USE DAILY 07/29/16   Renato Shin, MD  benzonatate (TESSALON) 100 MG capsule Take 1 capsule (100 mg total) by mouth 3 (three) times daily. 08/30/18   Lavina Hamman, MD  Blood Glucose Monitoring Suppl (ACCU-CHEK AVIVA PLUS) W/DEVICE KIT Use to check blood sugar daily. Dx code: 250.61 01/28/14   Renato Shin, MD  budesonide-formoterol [facility]) 160-4.5 MCG/ACT inhaler Inhale 2 puffs into the lungs 2 (two) times daily. 08/13/18 08/13/19  [provider]  Dulaglutide (TRULICITY) 6.81 EX/5.1ZG SOPN Inject 0.75 mg into the skin once a week. 07/06/18   [provider]  ferrous sulfate 325 (65 FE) MG tablet Take 325 mg by mouth daily with breakfast.    [provider]  furosemide (LASIX) 20 MG tablet Take 40 mg by mouth daily.    [provider]  gabapentin (NEURONTIN) 100 MG  capsule Take 300 mg by mouth 2 (two) times daily.    [provider]  glimepiride (AMARYL) 4 MG tablet Take 4 mg by mouth 2 (two) times daily.    [provider]  glucose blood (ACCU-CHEK AVIVA PLUS) test strip Use to test blood sugar 2 times daily as instructed. Dx code: 250.61 01/28/14   Renato Shin, MD  Glucose Blood (BAYER BREEZE 2 TEST) DISK TEST 2 TIMES PER DAY 01/24/14   Renato Shin, MD  hydrOXYzine (VISTARIL) 25 MG capsule Can take 1 or 2 tablets every 6 hours as needed for anxiety. This medication  is sedating. 09/19/18   Lauraine Rinne, NP  Insulin Lispro Prot & Lispro (HUMALOG MIX 75/25 KWIKPEN) (75-25) 100 UNIT/ML Kwikpen Inject 50 Units into the skin 2 (two) times daily. 04/28/16   [provider]  ipratropium-albuterol (DUONEB) 0.5-2.5 (3) MG/3ML SOLN Take 3 mLs by nebulization every 6 (six) hours. 09/11/18   Lauraine Rinne, NP  meloxicam (MOBIC) 7.5 MG tablet Take 7.5 mg by mouth daily. 01/22/18   [provider]  metFORMIN (GLUCOPHAGE-XR) 500 MG 24 hr tablet Take 1,000 mg by mouth at bedtime.    [provider]  montelukast (SINGULAIR) 10 MG tablet Take 10 mg by mouth at bedtime.    [provider]  omeprazole (PRILOSEC) 40 MG capsule Take 1 capsule (40 mg total) by mouth daily. PT NEEDS NEW PCP FOR FURTHER REFILLS DR [name] RETIRED Patient taking differently: Take 40 mg by mouth daily.  12/09/13   [name], Heinz Knuckles, MD  ranolazine (RANEXA) 500 MG 12 hr tablet Take 1 tablet (500 mg total) by mouth 2 (two) times daily. 09/13/18   Park Liter, MD  rOPINIRole (REQUIP) 4 MG tablet Take 4 mg by mouth at bedtime.     [provider]  sertraline (ZOLOFT) 50 MG tablet Take 1 tablet (50 mg total) by mouth daily. Patient taking differently: Take 100 mg by mouth daily.  12/09/13   Hendricks Limes, MD  simvastatin (ZOCOR) 40 MG tablet Take 1 tablet (40 mg total) by mouth at bedtime. PT NEEDS NEW PCP FOR FURTHER REFILLS DR [name]  RETIRED Patient taking differently: Take 40 mg by mouth at bedtime.  12/09/13   [name], Heinz Knuckles, MD  Spacer/Aero-Holding Chambers (AEROCHAMBER MV) inhaler Use as instructed 09/10/18   Lauraine Rinne, NP  traZODone (DESYREL) 100 MG tablet Take 100-200 mg by mouth at bedtime.    [provider]    Allergies    Patient has no known allergies.  Review of Systems   Review of Systems 10 Systems reviewed and are negative for acute change except as noted in the HPI.  Physical Exam Updated Vital Signs BP 116/65   Pulse (!) 112   Temp 99.5 F (37.5 C) (Oral)   Resp (!) 29   Ht '5\' 2"'  (1.575 m)   Wt 95.3 kg   SpO2 93%   BMI 38.41 kg/m   Physical Exam Constitutional:      Comments: Patient appears uncomfortable.  She is ill in appearance.  No respiratory distress at rest.  Intermittent harsh cough episodes.  HENT:     Head: Normocephalic and atraumatic.     Mouth/Throat:     Comments: No tonsillar enlargement or significant erythema of the tonsillar pillars.  Trace of postnasal drainage in the posterior oropharynx. Eyes:     Extraocular Movements: Extraocular movements intact.     Conjunctiva/sclera: Conjunctivae normal.  Cardiovascular:     Comments: Tachycardia.  No gross rub murmur gallop. Pulmonary:     Comments: Harsh paroxysmal cough.  Mild increased work of breathing at rest.  Scattered expiratory wheeze and crackle of the lower lung fields. Abdominal:     General: There is no distension.     Palpations: Abdomen is soft.     Tenderness: There is no abdominal tenderness. There is no guarding.  Musculoskeletal:        General: No swelling or tenderness. Normal range of motion.     Cervical back: Neck supple.     Right lower leg: No edema.  Left lower leg: No edema.  Skin:    General: Skin is warm and dry.  Neurological:     General: No focal deficit present.     Mental Status: She is oriented to person, place, and time.     Coordination: Coordination normal.    Psychiatric:        Mood and Affect: Mood normal.     ED Results / Procedures / Treatments   Labs (all labs ordered are listed, but only abnormal results are displayed) Labs Reviewed  LACTIC ACID, PLASMA - Abnormal; Notable for the following components:      Result Value   Lactic Acid, Venous 2.7 (*)    All other components within normal limits  CBC WITH DIFFERENTIAL/PLATELET - Abnormal; Notable for the following components:   WBC 12.7 (*)    Neutro Abs 10.1 (*)    All other components within normal limits  COMPREHENSIVE METABOLIC PANEL - Abnormal; Notable for the following components:   Sodium 133 (*)    Chloride 96 (*)    Glucose, Bld 348 (*)    Albumin 3.4 (*)    All other components within normal limits  D-DIMER, QUANTITATIVE (NOT AT [facility]) - Abnormal; Notable for the following components:   D-Dimer, Quant 1.48 (*)    All other components within normal limits  FIBRINOGEN - Abnormal; Notable for the following components:   Fibrinogen 590 (*)    All other components within normal limits  C-REACTIVE PROTEIN - Abnormal; Notable for the following components:   CRP 9.6 (*)    All other components within normal limits  RESPIRATORY PANEL BY RT PCR (FLU A&B, COVID)  CULTURE, BLOOD (ROUTINE X 2)  CULTURE, BLOOD (ROUTINE X 2)  GROUP A STREP BY PCR  PROCALCITONIN  LACTATE DEHYDROGENASE  FERRITIN  TRIGLYCERIDES  LACTIC ACID, PLASMA    EKG EKG Interpretation  Date/Time:  Tuesday September 03 2019 13:58:39 EST Ventricular Rate:  109 PR Interval:    QRS Duration: 84 QT Interval:  318 QTC Calculation: 429 R Axis:   24 Text Interpretation: Sinus tachycardia no sig change from previous Confirmed by Charlesetta Shanks [phone]) on 09/03/2019 3:20:34 PM   Radiology DG Chest Port 1 View  Result Date: 09/03/2019 CLINICAL DATA:  Cough, sore throat EXAM: PORTABLE CHEST 1 VIEW COMPARISON:  09/19/2018 FINDINGS: No new consolidation or edema. Stable cardiomediastinal contours with mild  cardiomegaly. No pleural effusion or pneumothorax. IMPRESSION: No acute process in the chest. Electronically Signed   By: Macy Mis M.D.   On: 09/03/2019 13:44    Procedures Procedures (including critical care time) CRITICAL CARE Performed by: Charlesetta Shanks   Total critical care time: 30 minutes  Critical care time was exclusive of separately billable procedures and treating other patients.  Critical care was necessary to treat or prevent imminent or life-threatening deterioration.  Critical care was time spent personally by me on the following activities: development of treatment plan with patient and/or surrogate as well as nursing, discussions with consultants, evaluation of patient's response to treatment, examination of patient, obtaining history from patient or surrogate, ordering and performing treatments and interventions, ordering and review of laboratory studies, ordering and review of radiographic studies, pulse oximetry and re-evaluation of patient's condition. Medications Ordered in ED Medications  albuterol (VENTOLIN HFA) 108 (90 Base) MCG/ACT inhaler 4 puff (has no administration in time range)  cefTRIAXone (ROCEPHIN) 1 g in sodium chloride 0.9 % 100 mL IVPB (has no administration in time range)  azithromycin (ZITHROMAX) 500  mg in sodium chloride 0.9 % 250 mL IVPB (has no administration in time range)  sodium chloride 0.9 % bolus 500 mL (has no administration in time range)  morphine 2 MG/ML injection 2 mg (has no administration in time range)  acetaminophen (TYLENOL) tablet 1,000 mg (1,000 mg Oral Given 09/03/19 1408)  0.9 %  sodium chloride infusion ( Intravenous New Bag/Given 09/03/19 1408)    ED Course  I have reviewed the triage vital signs and the nursing notes.  Pertinent labs & imaging results that were available during my care of the patient were reviewed by me and considered in my medical decision making (see chart for details).  Clinical Course as of Sep 02 1605  Tue Sep 03, 2019  1606 Consult: Internal medicine teaching service Dr. Koleen Distance accepts for admission   [MP]    Clinical Course User Index [MP] Charlesetta Shanks, MD   MDM Rules/Calculators/A&P                     Patient presents acutely ill.  She has been febrile up to 101 and has harsh cough with Crackles in the lungs.  Symptoms are highly suggestive of COVID-19 however the PCR test is negative as well as influenza test.  At this time with fever, leukocytosis, lactic acidosis there is concern for early sepsis which I suspect is a pulmonary source based on her exam.  We will start Rocephin and Zithromax with planned admission for Sir[facility] with significant associated comorbid illnesses of diabetes, cardiac disease and pulmonary disease.  Final Clinical Impression(s) / ED Diagnoses Final diagnoses:  SIRS (systemic inflammatory response syndrome) ([place])    Rx / DC Orders ED Discharge Orders    None       Charlesetta Shanks, MD 09/03/19 1553    Charlesetta Shanks, MD 09/03/19 1607

## 2019-09-03 NOTE — ED Notes (Signed)
Dinner Tray Ordered @ 1740. 

## 2019-09-03 NOTE — ED Notes (Signed)
[facility] [id number], admitting residents paged for insulin orders, awaiting response.

## 2019-09-03 NOTE — Plan of Care (Signed)
  Problem: Safety: Goal: Ability to remain free from injury will improve Outcome: Progressing   

## 2019-09-03 NOTE — H&P (Signed)
Date: 09/03/2019               Carrie White Name:  Carrie White MRN: [id number]  DOB: 1941/03/16 Age / Sex: 79 y.o., female   PCP: Lauraine Rinne, MD         Medical Service: Internal Medicine Teaching Service         Attending Physician: Dr. Evette Doffing, Mallie Mussel, *    First Contact: Dr. Charleen Kirks  Pager: [phone]  Second Contact: Dr. Koleen Distance Pager: [phone]       After Hours (After 5p/  First Contact Pager: [phone]  weekends / holidays): Second Contact Pager: [phone]   Chief Complaint: Sore throat and cough  History of Present Illness:   Carrie White is a 80 y/o female with a PMHx of CAD, HFpEF, T2DM who presents to the ED with c/o sore throat and cough.    Carrie White states that she began to feel malaise on Sunday, approximately 3 days ago, and symptoms quickly escalated. She began having a sore throat described as severe, as well as a non-productive cough with chest congestion. Carrie White also endorses SOB at rest and CP when coughing. She endorses 1 febrile episode up to 100 F on Sunday as well. She had several episodes of sudden onset nausea with vomiting that was non-bilious and non-bloody. She denies any abdominal pain at the time or diarrhea. Symptoms worsened without any improvement, other than nausea and vomiting, which had resolved up till this AM, when she had an additional 1 episode. This AM, Carrie White also had one episode of diarrhea that was non-bloody and non-melanotic. She endorses generalized weakness and decreased appetite. Carrie White denies CP at any time other than when coughing. She denies headache, dizziness, urinary changes including hematuria and dysuria.   Carrie White has been using her Albuterol inhaler PRN but not used any other inhalers. She has a diagnosis of reactive asthma from 1 year prior, when she also had pneumonia. She has not tried any other OTC medication for symptomatic relief.   Mrs. Schlag notes she received her 1st dose of  COVID vaccine last week. She denies any known COVID exposures.   Carrie White's niece, Mrs. Ulysees Barns, is her legal guardian and health proxy.   ED Course:  CXR was negative for opacities or infiltrates. Initial lab work remarkable for some elevated inflammatory markers, including D-dimer of 1.48, fibrinogen of 590, CRP of 9.6. COVID and influenza tests negative. Group a strep negative. Lactic acid mildly elevated at 2.7. CBC positive for leukocytosis with left shift. CMP demonstrates mildly low sodium at 133, hyperglycemia of 348.   Meds:  No current facility-administered medications on file prior to encounter.   Current Outpatient Medications on File Prior to Encounter  Medication Sig Dispense Refill  . ACCU-CHEK SOFTCLIX LANCETS lancets Use to test blood sugar 2 times daily as instructed. Dx code: 250.61 100 each 10  . albuterol (PROVENTIL HFA;VENTOLIN HFA) 108 (90 Base) MCG/ACT inhaler Inhale 1 puff into the lungs every 6 (six) hours as needed for wheezing or shortness of breath. 1 Inhaler 5  . aspirin EC 81 MG EC tablet Take 1 tablet (81 mg total) by mouth daily. 30 tablet 0  . BD ULTRA-FINE PEN NEEDLES 29G X 12.7MM MISC USE DAILY 100 each 1  . BD ULTRA-FINE PEN NEEDLES 29G X 12.7MM MISC USE DAILY 100 each 0  . benzonatate (TESSALON) 100 MG capsule Take 1 capsule (100 mg total) by mouth 3 (three)  times daily. 20 capsule 0  . Blood Glucose Monitoring Suppl (ACCU-CHEK AVIVA PLUS) W/DEVICE KIT Use to check blood sugar daily. Dx code: 250.61 1 kit 0  . budesonide-formoterol (SYMBICORT) 160-4.5 MCG/ACT inhaler Inhale 2 puffs into the lungs 2 (two) times daily.    . Dulaglutide (TRULICITY) 5.88 FO/2.7XA SOPN Inject 0.75 mg into the skin once a week.    . ferrous sulfate 325 (65 FE) MG tablet Take 325 mg by mouth daily with breakfast.    . furosemide (LASIX) 20 MG tablet Take 40 mg by mouth daily.    Marland Kitchen gabapentin (NEURONTIN) 100 MG capsule Take 300 mg by mouth 2 (two) times daily.    Marland Kitchen glimepiride  (AMARYL) 4 MG tablet Take 4 mg by mouth 2 (two) times daily.    Marland Kitchen glucose blood (ACCU-CHEK AVIVA PLUS) test strip Use to test blood sugar 2 times daily as instructed. Dx code: 250.61 100 each 10  . Glucose Blood (BAYER BREEZE 2 TEST) DISK TEST 2 TIMES PER DAY 200 each 3  . hydrOXYzine (VISTARIL) 25 MG capsule Can take 1 or 2 tablets every 6 hours as needed for anxiety. This medication is sedating. 60 capsule 3  . Insulin Lispro Prot & Lispro (HUMALOG MIX 75/25 KWIKPEN) (75-25) 100 UNIT/ML Kwikpen Inject 50 Units into the skin 2 (two) times daily.    Marland Kitchen ipratropium-albuterol (DUONEB) 0.5-2.5 (3) MG/3ML SOLN Take 3 mLs by nebulization every 6 (six) hours. 360 mL 11  . meloxicam (MOBIC) 7.5 MG tablet Take 7.5 mg by mouth daily.    . metFORMIN (GLUCOPHAGE-XR) 500 MG 24 hr tablet Take 1,000 mg by mouth at bedtime.    . montelukast (SINGULAIR) 10 MG tablet Take 10 mg by mouth at bedtime.    Marland Kitchen omeprazole (PRILOSEC) 40 MG capsule Take 1 capsule (40 mg total) by mouth daily. PT NEEDS NEW PCP FOR FURTHER REFILLS DR [name] RETIRED (Carrie White taking differently: Take 40 mg by mouth daily. ) 30 capsule 5  . ranolazine (RANEXA) 500 MG 12 hr tablet Take 1 tablet (500 mg total) by mouth 2 (two) times daily. 60 tablet 2  . rOPINIRole (REQUIP) 4 MG tablet Take 4 mg by mouth at bedtime.     . sertraline (ZOLOFT) 50 MG tablet Take 1 tablet (50 mg total) by mouth daily. (Carrie White taking differently: Take 100 mg by mouth daily. ) 30 tablet 0  . simvastatin (ZOCOR) 40 MG tablet Take 1 tablet (40 mg total) by mouth at bedtime. PT NEEDS NEW PCP FOR FURTHER REFILLS DR [name] RETIRED (Carrie White taking differently: Take 40 mg by mouth at bedtime. ) 30 tablet 5  . Spacer/Aero-Holding Chambers (AEROCHAMBER MV) inhaler Use as instructed 1 each 0  . traZODone (DESYREL) 100 MG tablet Take 100-200 mg by mouth at bedtime.     Allergies: Allergies as of 09/03/2019  . (No Known Allergies)   Past Medical History:  Diagnosis Date  .  Complication of anesthesia   . Depression   . Diabetes mellitus    type II- uncontrolled  . FIBROMYALGIA 05/07/2007  . Fibromyalgia   . GERD (gastroesophageal reflux disease)   . GOITER, MULTINODULAR 07/01/2010  . HYPERLIPIDEMIA 07/31/2008  . HYPERTENSION 08/01/2008  . INSOMNIA, CHRONIC 06/29/2009  . Intraductal papilloma of breast 07/12/2011  . OSTEOARTHRITIS 05/07/2007  . PONV (postoperative nausea and vomiting)   . RESTLESS LEG SYNDROME 05/07/2007   Family History:  Family History  Problem Relation Age of Onset  . Hypertension Mother   . Stroke  Mother        CVA  . Cancer Father        Colon Cancer with Mets, Lung Cancer  . Leukemia Sister   . Diabetes Sister   . Cancer Sister        Breast Cancer  . Hypertension Brother   . Stroke Brother        CVA  . Cancer Brother        Prostate Cancer  . Cancer Sister        Breast Cancer  . Lupus Sister   . Diabetes Brother   . Thyroid disease Other        Brother (1974) thyroid surgery  . Thyroid disease Other        Sister (1981) thyroid surgery   Social History:  Lives alone, as husband passed away 6 years ago Former light smoker when in school, quit many years ago Denies alcohol or drug use   Review of Systems: A complete ROS was negative except as per HPI.   Physical Exam: Blood pressure (!) 118/58, pulse (!) 101, temperature 99.5 F (37.5 C), temperature source Oral, resp. rate (!) 21, height '5\' 2"'  (1.575 m), weight 95.3 kg, SpO2 95 %.   Physical Exam Vitals and nursing note reviewed.  Constitutional:      General: She is not in acute distress.    Appearance: She is obese.  HENT:     Head: Normocephalic and atraumatic.     Mouth/Throat:     Mouth: Mucous membranes are dry. No oral lesions.     Pharynx: Oropharynx is clear. Posterior oropharyngeal erythema (mild) present. No pharyngeal swelling, oropharyngeal exudate or uvula swelling.     Tonsils: No tonsillar exudate or tonsillar abscesses. 0 on the right.  0 on the left.  Cardiovascular:     Rate and Rhythm: Regular rhythm. Tachycardia present.     Heart sounds: No murmur.  Pulmonary:     Effort: Tachypnea present. No accessory muscle usage.     Breath sounds: Examination of the right-middle field reveals rales. Examination of the left-middle field reveals rales. Examination of the right-lower field reveals rales. Examination of the left-lower field reveals rales. Wheezing (diffuse, inspiratory and expiratory wheezing) and rales present.  Abdominal:     General: Bowel sounds are normal. There is no distension.     Palpations: Abdomen is soft.     Tenderness: There is no abdominal tenderness. There is no guarding.  Musculoskeletal:        General: No tenderness.     Right lower leg: No edema.     Left lower leg: No edema.  Skin:    General: Skin is warm and dry.     Findings: No bruising or rash.  Neurological:     General: No focal deficit present.     Mental Status: She is alert and oriented to person, place, and time. Mental status is at baseline.  Psychiatric:        Mood and Affect: Mood normal.        Behavior: Behavior normal.    EKG: personally reviewed my interpretation is: Sinus tachycardia with intervals within normal limits. No ST elevation or depression. Non-specific T wave inversions.   CXR: personally reviewed my interpretation is: Increased pulmonary congestion with minimal right pleural effusion.   Assessment & Plan by Problem: Active Problems:   Lower respiratory tract infection  Carrie White is a 79 y/o female with a PMHx of CAD, HFpEF,  T2DM who presented to the ED with 3 days of sore throat, congestion and non-productive cough who is currently admitted for a lower respiratory tract infection.   # Lower Respiratory Tract Infection  CBC is positive for a leukocytosis with left shift, mild lactic acidosis (which improved with IVF). Given Carrie White's clinical picture of upper and lower respiratory symptoms, as  well as GI symptoms, differential favors a viral process. She is negative for COVID and influenza, but will obtain a full RVP. Legionella is also a possibility, although pro-calcitonin levels are low at this time. Carrie White was started on Azithromycin and Ceftriaxone in the ED that will cover through tomorrow PM. Will re-evaluate need for antibiotics again tomorrow AM. Strep pneumo antigen negative.   - Telemetry  - RVP  - CBC in the AM - Blood cultures pending  - Droplet precautions  - Repeat CXR in the AM  - Tylenol PRN for fever  - Duoneb q6h  - Tessalon Pearls TID  - Flutter valve  - O2 supplementation as needed to maintain saturation >88%   # History of Asthma Per chart review, Carrie White had reactive asthma with her last hospitalization for pneumonia approximately 1 year ago. She has followed up Pulmonology since then, who feel her asthma/wheezing has resolved as spirometry in 09/2018 had normalized.  No longer using daily inhalers but has used Albuterol twice since symptom onset. Given diffuse wheezing on examination today, will schedule Duonebs   - Duoneb q6 while awake   # CAD Calcification of coronary arteries noted on CT angio last year. She followed up with Cardiology, who started Carrie White on Ranolazine for stable angina. Her moderate intensity statin was continued, with plan to increase to high intensity in the future. Will recommend she follow up with PCP. Today, her CP is only pleuritic with cough and lack of concerning EKG findings makes ACS very unlikely.   - Continue home Ranolazine and Simvastatin   # Type 2 Diabetes A1c on 07/27/2019 was 11.7%, a significant increase from prior 8.9% in 05/2019. Although Carrie White endorses using home medications as prescribed, T2DM is very uncontrolled. Home medications include Dulaglutide and Humalog 75/25 50 units BID. Outpatient follow up recommended.   - SSI - M - Lantus 40 units at bedtime   # HFpEF Last echo in January 2020 showed EF  of 55-60% with grade 1 diastolic dysfunction. Carrie White is euvolemic on examination. Will continue to monitor.    Code: DNR/DNI  Diet: Heart healthy/Carb modified DVT ppx: Lovenox   Dispo: Admit Carrie White to Inpatient with expected length of stay greater than 2 midnights.  Signed: Dr. Jose Persia Internal Medicine PGY-1  Pager: [phone] 09/03/2019, 7:27 PM

## 2019-09-03 NOTE — ED Triage Notes (Signed)
Pt reports sore throat, cough, body aches, nausea and diarrhea since yesterday. Pt had two episodes of diarrhea this morning so she took 1 immodium just PTA. Pt a.o, resp e.u at this time.

## 2019-09-04 ENCOUNTER — Inpatient Hospital Stay (HOSPITAL_COMMUNITY): Payer: Medicare Other

## 2019-09-04 ENCOUNTER — Encounter (HOSPITAL_COMMUNITY): Payer: Self-pay | Admitting: Student in an Organized Health Care Education/Training Program

## 2019-09-04 DIAGNOSIS — E119 Type 2 diabetes mellitus without complications: Secondary | ICD-10-CM | POA: Diagnosis not present

## 2019-09-04 DIAGNOSIS — I2510 Atherosclerotic heart disease of native coronary artery without angina pectoris: Secondary | ICD-10-CM

## 2019-09-04 DIAGNOSIS — J208 Acute bronchitis due to other specified organisms: Principal | ICD-10-CM

## 2019-09-04 DIAGNOSIS — I5030 Unspecified diastolic (congestive) heart failure: Secondary | ICD-10-CM

## 2019-09-04 DIAGNOSIS — J22 Unspecified acute lower respiratory infection: Secondary | ICD-10-CM

## 2019-09-04 DIAGNOSIS — Z794 Long term (current) use of insulin: Secondary | ICD-10-CM

## 2019-09-04 DIAGNOSIS — Z79899 Other long term (current) drug therapy: Secondary | ICD-10-CM

## 2019-09-04 DIAGNOSIS — Z8709 Personal history of other diseases of the respiratory system: Secondary | ICD-10-CM

## 2019-09-04 DIAGNOSIS — J9600 Acute respiratory failure, unspecified whether with hypoxia or hypercapnia: Secondary | ICD-10-CM | POA: Diagnosis present

## 2019-09-04 DIAGNOSIS — J45909 Unspecified asthma, uncomplicated: Secondary | ICD-10-CM | POA: Diagnosis present

## 2019-09-04 HISTORY — DX: Unspecified acute lower respiratory infection: J22

## 2019-09-04 LAB — BASIC METABOLIC PANEL
Anion gap: 11 (ref 5–15)
BUN: 8 mg/dL (ref 8–23)
CO2: 27 mmol/L (ref 22–32)
Calcium: 8.7 mg/dL — ABNORMAL LOW (ref 8.9–10.3)
Chloride: 98 mmol/L (ref 98–111)
Creatinine, Ser: 0.77 mg/dL (ref 0.44–1.00)
GFR calc Af Amer: >60 mL/min (ref >60)
GFR calc non Af Amer: >60 mL/min (ref >60)
Glucose, Bld: 204 mg/dL — ABNORMAL HIGH (ref 70–99)
Potassium: 3.3 mmol/L — ABNORMAL LOW (ref 3.5–5.1)
Sodium: 136 mmol/L (ref 135–145)

## 2019-09-04 LAB — GLUCOSE, CAPILLARY
Glucose-Capillary: 294 mg/dL — ABNORMAL HIGH (ref 70–99)
Glucose-Capillary: 285 mg/dL — ABNORMAL HIGH (ref 70–99)
Glucose-Capillary: 239 mg/dL — ABNORMAL HIGH (ref 70–99)
Glucose-Capillary: 321 mg/dL — ABNORMAL HIGH (ref 70–99)

## 2019-09-04 LAB — RESPIRATORY PANEL BY PCR

## 2019-09-04 LAB — CBC
HCT: 35.8 % — ABNORMAL LOW (ref 36.0–46.0)
Hemoglobin: 11.9 g/dL — ABNORMAL LOW (ref 12.0–15.0)
MCH: 30.1 pg (ref 26.0–34.0)
MCHC: 33.2 g/dL (ref 30.0–36.0)
MCV: 90.4 fL (ref 80.0–100.0)
Platelets: 190 10*3/uL (ref 150–400)
RBC: 3.96 MIL/uL (ref 3.87–5.11)
RDW: 12.4 % (ref 11.5–15.5)
WBC: 13 10*3/uL — ABNORMAL HIGH (ref 4.0–10.5)
nRBC: 0 % (ref 0.0–0.2)

## 2019-09-04 MED ORDER — BUDESONIDE 0.5 MG/2ML IN SUSP
0.5000 mg | Freq: Two times a day (BID) | RESPIRATORY_TRACT | Status: DC
  Administered 2019-09-04 – 2019-09-05 (×2): 0.5 mg via RESPIRATORY_TRACT
  Filled 2019-09-04 (×2): qty 2

## 2019-09-04 NOTE — Progress Notes (Signed)
Inpatient Diabetes Program Recommendations  AACE/ADA: New Consensus Statement on Inpatient Glycemic Control (2015)  Target Ranges:  Prepandial:   less than 140 mg/dL      Peak postprandial:   less than 180 mg/dL (1-2 hours)      Critically ill patients:  140 - 180 mg/dL   Lab Results  Component Value Date   GLUCAP 285 (H) 09/04/2019   HGBA1C 9.9 (H) 09/03/2019      Diabetes history: DM2 Outpatient Diabetes medications: Humalog (75/25) 50 units with breakfast and dinner; Trulicity every Sunday; Metformin 1000 daily Current orders for Inpatient glycemic control: Novolog 0-15 TID & 0-5 QHS + Novolog 10 units with meals TID + Lantus 40 QHS   Note:  Spoke with patient at bedside.  Reviewed patient's current A1c of 9.9% (average BS 240 mg/dl). Explained what a A1c is and what it measures. Also reviewed goal A1c with patient, importance of good glucose control @ home, and blood sugar goals.  She states her A1c is usually "not good".  She admits to drinking sweet tea.  Encouraged patient to not drink sugary drinks.  Reviewed long tern risks of elevated blood sugar.  She is current with Dr. Iran Planas.  She has an appointment this Friday.  She checks her CBG's BID.  Denies difficulties obtaining medications and states she takes her medications as prescribed.  Reviewed hypoglycemia and treatment.  Encouraged patient to call PCP is CBG's consisently > 200 mg/dl.     Thank you, Reche Dixon, RN, BSN Diabetes Coordinator Inpatient Diabetes Program [phone] (team pager from 8a-5p)

## 2019-09-04 NOTE — Plan of Care (Signed)
  Problem: Clinical Measurements: Goal: Respiratory complications will improve Outcome: Progressing   Problem: Activity: Goal: Risk for activity intolerance will decrease Outcome: Progressing   Problem: Safety: Goal: Ability to remain free from injury will improve Outcome: Progressing   

## 2019-09-04 NOTE — Progress Notes (Signed)
Pt. Complaining of pleuratic chest pain and soreness in her throat. On call for IMTS paged to make aware.

## 2019-09-04 NOTE — Progress Notes (Signed)
   Subjective:   Carrie White continues to have cough with associated chest pain, as well as SOB and sore throat. She endorses one eipsode of vomiting this AM after breakfast.   We discussed her home living situation. Carrie White does live alone now that her husband passed away. She performs all her ADLs independently. Occasionally, her niece Carrie White does help around the house. Carrie White is also Carrie White's health care proxy.   Objective:  Vital signs in last 24 hours: Vitals:   09/04/19 0525 09/04/19 0525 09/04/19 0753 09/04/19 0808  BP:  129/61  (!) 121/59  Pulse:  (!) 102 96 95  Resp:  18 (!) 24 18  Temp:  98.2 F (36.8 C)  98.8 F (37.1 C)  TempSrc:  Oral  Oral  SpO2:  (!) 89% 93% 90%  Weight: 91.4 kg     Height:        Physical Exam Vitals and nursing note reviewed.  Constitutional:      General: She is not in acute distress.    Appearance: She is obese.  Cardiovascular:     Rate and Rhythm: Normal rate and regular rhythm.     Heart sounds: No murmur.  Pulmonary:     Effort: Pulmonary effort is normal. No tachypnea, accessory muscle usage, prolonged expiration or respiratory distress.     Breath sounds: Wheezing (diffuse, inspiratory and expiratory) present. No decreased breath sounds or rhonchi.  Skin:    General: Skin is warm and dry.  Neurological:     General: No focal deficit present.     Mental Status: She is alert and oriented to person, place, and time.  Psychiatric:        Mood and Affect: Mood normal.        Behavior: Behavior normal.    Assessment/Plan:  Principal Problem:   Acute respiratory failure (HCC) Active Problems:   Lower respiratory tract infection  [name] is a 79 y/o female with a PMHx of CAD, HFpEF, T2DM who presented to the ED with 3 days of sore throat, congestion and non-productive cough who is currently admitted for a lower respiratory tract infection.   # Acute respiratory failure secondary to viral bronchitis  Given  patient's clinical picture of upper and lower respiratory symptoms, as well as GI symptoms, differential favors a viral process. RVP panel is negative though. Chest xray today with lateral and PA views show no consolidations or opacities, ruling out pneumonia. Will obtain ambulating oxygen saturation and consider discharge home with supportive care either today or tomorrow.   - Tylenol PRN for fever  - Robitussin PRN - Tessalon Pearls TID  - Flutter valve   # History of Asthma Continues to have wheezing on examination today. Will add on ICS for additional relief.   - Duoneb q6 while awake  - Dulera 2 puffs BID   # CAD - Continue home Ranolazine and Simvastatin   # Type 2 Diabetes - SSI - M - Lantus 40 units at bedtime   Dispo: Anticipated discharge in approximately 0-1 day(s). Discharge most likely home, pending PT evaluation, as prior to admission, patient has been independently doing all her ADLs.   Dr. Jose Persia Internal Medicine PGY-1  Pager: [phone] 09/04/2019, 8:56 AM

## 2019-09-04 NOTE — Evaluation (Signed)
Physical Therapy Evaluation Patient Details Name: Carrie White MRN: [id number] DOB: [birth year] Today's Date: 09/04/2019   History of Present Illness  [name] is a 79 y/o female with a PMHx of CAD, HFpEF, T2DM who presented to the ED with 3 days of sore throat, congestion and non-productive cough who is currently admitted for a lower respiratory tract infection. Covid test negative.  Clinical Impression  Pt admitted with above. Pt presents with decreased functional mobility secondary to decreased endurance and balance impairments. Pt ambulating 200 feet with a walker at a min assist level; SpO2 93-96% on RA, HR peak 121 bpm. Recommended use of walker for all mobility, increased supervision/assist from niece initially, education provided on endurance conservation techniques and pillow splinting for pain control. Recommending HHPT at discharge to maximize functional independence.     Follow Up Recommendations Home health PT;Supervision for mobility/OOB    Equipment Recommendations  Rolling walker with 5" wheels    Recommendations for Other Services       Precautions / Restrictions Precautions Precautions: Fall Restrictions Weight Bearing Restrictions: No      Mobility  Bed Mobility Overal bed mobility: Modified Independent                Transfers Overall transfer level: Needs assistance Equipment used: None Transfers: Sit to/from Stand Sit to Stand: Supervision            Ambulation/Gait Ambulation/Gait assistance: Min assist Gait Distance (Feet): 200 Feet Assistive device: Rolling walker (2 wheeled) Gait Pattern/deviations: Step-through pattern;Decreased stride length;Shuffle Gait velocity: decreased   General Gait Details: Pt requiring min assist, one anterior LOB, veering into several objects with negotiation requiring cues to correct  Stairs            Wheelchair Mobility    Modified Rankin (Stroke Patients Only)       Balance  Overall balance assessment: Needs assistance Sitting-balance support: Feet supported Sitting balance-Leahy Scale: Good     Standing balance support: Bilateral upper extremity supported Standing balance-Leahy Scale: Poor Standing balance comment: fair statically, poor dynamically                             Pertinent Vitals/Pain Pain Assessment: Faces Faces Pain Scale: Hurts little more Pain Location: chest when coughing Pain Descriptors / Indicators: Grimacing Pain Intervention(s): Limited activity within patient's tolerance;Monitored during session;Other (comment)(provided pillow for splinting)    Home Living Family/patient expects to be discharged to:: Private residence Living Arrangements: Alone Available Help at Discharge: Family;Friend(s);Available PRN/intermittently(niece) Type of Home: House Home Access: Stairs to enter   Entrance Stairs-Number of Steps: 1 Home Layout: Able to live on main level with bedroom/bathroom Home Equipment: Clinical cytogeneticist - 2 wheels;Other (comment);Bedside commode(upright walker)      Prior Function Level of Independence: Independent         Comments: Drives, community ambulatory     Hand Dominance        Extremity/Trunk Assessment   Upper Extremity Assessment Upper Extremity Assessment: Generalized weakness    Lower Extremity Assessment Lower Extremity Assessment: Generalized weakness       Communication   Communication: No difficulties  Cognition Arousal/Alertness: Awake/alert Behavior During Therapy: WFL for tasks assessed/performed Overall Cognitive Status: Impaired/Different from baseline Area of Impairment: Safety/judgement                         Safety/Judgement: Decreased awareness of deficits;Decreased awareness of safety  General Comments      Exercises     Assessment/Plan    PT Assessment Patient needs continued PT services  PT Problem List Decreased  strength;Decreased activity tolerance;Decreased balance;Decreased mobility;Decreased safety awareness       PT Treatment Interventions DME instruction;Gait training;Stair training;Functional mobility training;Therapeutic activities;Therapeutic exercise;Balance training;Patient/family education    PT Goals (Current goals can be found in the Care Plan section)  Acute Rehab PT Goals Patient Stated Goal: go home PT Goal Formulation: With patient Time For Goal Achievement: 09/18/19 Potential to Achieve Goals: Good    Frequency Min 3X/week   Barriers to discharge        Co-evaluation               AM-PAC PT "6 Clicks" Mobility  Outcome Measure Help needed turning from your back to your side while in a flat bed without using bedrails?: None Help needed moving from lying on your back to sitting on the side of a flat bed without using bedrails?: None Help needed moving to and from a bed to a chair (including a wheelchair)?: None Help needed standing up from a chair using your arms (e.g., wheelchair or bedside chair)?: None Help needed to walk in hospital room?: A Little Help needed climbing 3-5 steps with a railing? : A Little 6 Click Score: 22    End of Session Equipment Utilized During Treatment: Gait belt Activity Tolerance: Patient tolerated treatment well Patient left: in bed;with call bell/phone within reach Nurse Communication: Mobility status PT Visit Diagnosis: Unsteadiness on feet (R26.81);Muscle weakness (generalized) (M62.81);Difficulty in walking, not elsewhere classified (R26.2)    Time: [id number] PT Time Calculation (min) (ACUTE ONLY): 28 min   Charges:   PT Evaluation $PT Eval Moderate Complexity: 1 Mod PT Treatments $Gait Training: 8-22 mins        Ellamae Sia, PT, DPT Acute Rehabilitation Services Pager [phone] Office [phone]   Willy Eddy 09/04/2019, 2:48 PM

## 2019-09-05 DIAGNOSIS — J45909 Unspecified asthma, uncomplicated: Secondary | ICD-10-CM

## 2019-09-05 DIAGNOSIS — Z7951 Long term (current) use of inhaled steroids: Secondary | ICD-10-CM

## 2019-09-05 DIAGNOSIS — J9600 Acute respiratory failure, unspecified whether with hypoxia or hypercapnia: Secondary | ICD-10-CM | POA: Diagnosis not present

## 2019-09-05 DIAGNOSIS — I2510 Atherosclerotic heart disease of native coronary artery without angina pectoris: Secondary | ICD-10-CM | POA: Diagnosis not present

## 2019-09-05 DIAGNOSIS — J22 Unspecified acute lower respiratory infection: Secondary | ICD-10-CM | POA: Diagnosis not present

## 2019-09-05 DIAGNOSIS — J208 Acute bronchitis due to other specified organisms: Secondary | ICD-10-CM | POA: Diagnosis not present

## 2019-09-05 LAB — GLUCOSE, CAPILLARY: Glucose-Capillary: 290 mg/dL — ABNORMAL HIGH (ref 70–99)

## 2019-09-05 LAB — CBC
HCT: 35 % — ABNORMAL LOW (ref 36.0–46.0)
Hemoglobin: 11.9 g/dL — ABNORMAL LOW (ref 12.0–15.0)
MCH: 30.4 pg (ref 26.0–34.0)
MCHC: 34 g/dL (ref 30.0–36.0)
MCV: 89.3 fL (ref 80.0–100.0)
Platelets: 203 10*3/uL (ref 150–400)
RBC: 3.92 MIL/uL (ref 3.87–5.11)
RDW: 12 % (ref 11.5–15.5)
WBC: 8.7 10*3/uL (ref 4.0–10.5)
nRBC: 0 % (ref 0.0–0.2)

## 2019-09-05 LAB — LEGIONELLA PNEUMOPHILA SEROGP 1 UR AG: L. pneumophila Serogp 1 Ur Ag: NEGATIVE

## 2019-09-05 LAB — BASIC METABOLIC PANEL
Anion gap: 9 (ref 5–15)
BUN: 13 mg/dL (ref 8–23)
CO2: 29 mmol/L (ref 22–32)
Calcium: 9 mg/dL (ref 8.9–10.3)
Chloride: 97 mmol/L — ABNORMAL LOW (ref 98–111)
Creatinine, Ser: 0.87 mg/dL (ref 0.44–1.00)
GFR calc Af Amer: >60 mL/min (ref >60)
GFR calc non Af Amer: >60 mL/min (ref >60)
Glucose, Bld: 250 mg/dL — ABNORMAL HIGH (ref 70–99)
Potassium: 3.4 mmol/L — ABNORMAL LOW (ref 3.5–5.1)
Sodium: 135 mmol/L (ref 135–145)

## 2019-09-05 MED ORDER — PHENOL 1.4 % MT LIQD: 1.0000 | OROMUCOSAL | 0 refills | Status: DC | PRN

## 2019-09-05 MED ORDER — ACETAMINOPHEN 325 MG PO TABS
650.0000 mg | ORAL_TABLET | Freq: Once | ORAL | Status: AC
  Administered 2019-09-05: 650 mg via ORAL
  Filled 2019-09-05: qty 2

## 2019-09-05 MED ORDER — ALBUTEROL SULFATE HFA 108 (90 BASE) MCG/ACT IN AERS: 1.0000 | INHALATION_SPRAY | Freq: Four times a day (QID) | RESPIRATORY_TRACT | 1 refills | Status: DC | PRN

## 2019-09-05 MED ORDER — QVAR REDIHALER 40 MCG/ACT IN AERB: 1.0000 | INHALATION_SPRAY | Freq: Two times a day (BID) | RESPIRATORY_TRACT | 0 refills | Status: DC

## 2019-09-05 MED ORDER — GUAIFENESIN-DM 100-10 MG/5ML PO SYRP: 5.0000 mL | ORAL_SOLUTION | Freq: Four times a day (QID) | ORAL | 0 refills | Status: DC | PRN

## 2019-09-05 MED ORDER — BENZONATATE 100 MG PO CAPS: 100.0000 mg | ORAL_CAPSULE | Freq: Three times a day (TID) | ORAL | 0 refills | Status: DC | PRN

## 2019-09-05 NOTE — Progress Notes (Signed)
   Subjective: Carrie White is feeling improved compared to when she came in. She was able to walk with PT yesterday without dropping her O2 sats. Still having some post-tussive emesis. Has been able to eat and drink well.   Discussed plan for discharge home today with home health services today.    Objective:  Vital signs in last 24 hours: Vitals:   09/04/19 2017 09/05/19 0039 09/05/19 0053 09/05/19 0407  BP:  132/74  (!) 142/72  Pulse:  96  91  Resp:  20  20  Temp:  97.9 F (36.6 C)  97.8 F (36.6 C)  TempSrc:  Oral  Oral  SpO2: 96% 95%  97%  Weight:   91.5 kg   Height:        Physical Exam Vitals and nursing note reviewed.  Constitutional:      General: She is not in acute distress.    Appearance: She is obese.  Pulmonary:     Effort: Pulmonary effort is normal. No respiratory distress.     Breath sounds: Normal breath sounds. No wheezing, rhonchi or rales.  Skin:    General: Skin is warm and dry.  Neurological:     Mental Status: She is alert and oriented to person, place, and time. Mental status is at baseline.  Psychiatric:        Mood and Affect: Mood normal.        Behavior: Behavior normal.    Assessment/Plan:  Principal Problem:   Acute respiratory failure (HCC) Active Problems:   Type 2 diabetes mellitus with hyperlipidemia (HCC)   Lower respiratory tract infection   Reactive airway disease  [name] is a 79 y/o female with aPMHx of CAD, HFpEF, T2DM whopresented to the ED with 3 days of sore throat, congestion and non-productive cough who is currently admitted for a lower respiratory tract infection.   # Acute respiratory failure secondary to viral bronchitis  Continues to be tachypneic intermittently but not requiring oxygen still. Plan to discharge today with supportive care.   - Tylenol PRN for fever and mild pain - Robitussin PRN - Tessalon Pearls TID   # History of Asthma Wheezing resolved on examination today. Will send home  with Albuterol PRN and ICS.   # CAD Resume home medications on discharge.   # Type 2 Diabetes Resume home medications on discharge.   Dispo: Anticipated discharge today.   Dr. Jose Persia Internal Medicine PGY-1  Pager: [phone] 09/05/2019, 6:42 AM

## 2019-09-05 NOTE — Progress Notes (Signed)
Inpatient Diabetes Program Recommendations  AACE/ADA: New Consensus Statement on Inpatient Glycemic Control (2015)  Target Ranges:  Prepandial:   less than 140 mg/dL      Peak postprandial:   less than 180 mg/dL (1-2 hours)      Critically ill patients:  140 - 180 mg/dL   Lab Results  Component Value Date   GLUCAP 290 (H) 09/05/2019   HGBA1C 9.9 (H) 09/03/2019    Review of Glycemic Control  Results for Carrie White, Carrie White (MRN [id number]) as of 09/05/2019 11:58  Ref. Range 09/04/2019 06:17 09/04/2019 11:16 09/04/2019 16:55 09/04/2019 21:43 09/05/2019 06:39  Glucose-Capillary Latest Ref Range: 70 - 99 mg/dL 294 (H) 285 (H) 239 (H) 321 (H) 290 (H)    Diabetes history: DM2 Outpatient Diabetes medications: Humalog (75/25) 50 units with breakfast and dinner; Trulicity every Sunday; Metformin 1000 daily Current orders for Inpatient glycemic control: Novolog 0-15 TID & 0-5 QHS + Novolog 10 units with meals TID + Lantus 40 QHS  Inpatient Diabetes Program Recommendations:     -Novolog 15 units TID with meals -Lantus 45 units daily  Thank you, Reche Dixon, RN, BSN Diabetes Coordinator Inpatient Diabetes Program [phone] (team pager from 8a-5p)

## 2019-09-05 NOTE — Discharge Summary (Signed)
Name: Carrie White MRN: [id number] DOB: [birth year] 79 y.o. PCP: Thornton Dales I, MD  Date of Admission: 09/03/2019 12:02 PM Date of Discharge: 09/05/2019 Attending Physician: Dr. Evette Doffing  Discharge Diagnosis: 1. Acute Respiratory Distress 2/2 Viral Bronchitis  2. Reactive Airway Disease   Discharge Medications: Allergies as of 09/05/2019   No Known Allergies      Medication List     STOP taking these medications    amoxicillin 500 MG capsule Commonly known as: AMOXIL   Symbicort 160-4.5 MCG/ACT inhaler Generic drug: budesonide-formoterol       TAKE these medications    Accu-Chek Aviva Plus w/Device Kit Use to check blood sugar daily. Dx code: 250.61   Accu-Chek Softclix Lancets lancets Use to test blood sugar 2 times daily as instructed. Dx code: 250.61   albuterol 108 (90 Base) MCG/ACT inhaler Commonly known as: VENTOLIN HFA Inhale 1-2 puffs into the lungs every 6 (six) hours as needed for wheezing or shortness of breath. What changed: how much to take   aspirin 81 MG EC tablet Take 1 tablet (81 mg total) by mouth daily.   BD ULTRA-FINE PEN NEEDLES 29G X 12.7MM Misc Generic drug: Insulin Pen Needle USE DAILY   benzonatate 100 MG capsule Commonly known as: TESSALON Take 1 capsule (100 mg total) by mouth 3 (three) times daily as needed for cough. What changed:   when to take this  reasons to take this   DULoxetine 30 MG capsule Commonly known as: CYMBALTA Take 30 mg by mouth daily.   ferrous sulfate 325 (65 FE) MG tablet Take 325 mg by mouth daily with breakfast.   furosemide 20 MG tablet Commonly known as: LASIX Take 40 mg by mouth daily.   gabapentin 300 MG capsule Commonly known as: NEURONTIN Take 300 mg by mouth at bedtime.   glimepiride 4 MG tablet Commonly known as: AMARYL Take 4 mg by mouth 2 (two) times daily.   Glucose Blood Disk Commonly known as: Psychologist, forensic 2 Test TEST 2 TIMES PER DAY   glucose blood test  strip Commonly known as: Accu-Chek Aviva Plus Use to test blood sugar 2 times daily as instructed. Dx code: 250.61   guaiFENesin-dextromethorphan 100-10 MG/5ML syrup Commonly known as: ROBITUSSIN DM Take 5 mLs by mouth every 6 (six) hours as needed for cough.   HumaLOG Mix 75/25 KwikPen (75-25) 100 UNIT/ML Kwikpen Generic drug: Insulin Lispro Prot & Lispro Inject 50 Units into the skin 2 (two) times daily.   hydrOXYzine 25 MG capsule Commonly known as: VISTARIL Can take 1 or 2 tablets every 6 hours as needed for anxiety. This medication is sedating.   ipratropium-albuterol 0.5-2.5 (3) MG/3ML Soln Commonly known as: DUONEB Take 3 mLs by nebulization every 6 (six) hours. What changed:   when to take this  reasons to take this   lisinopril 10 MG tablet Commonly known as: ZESTRIL Take 10 mg by mouth daily.   meloxicam 7.5 MG tablet Commonly known as: MOBIC Take 7.5 mg by mouth daily.   metFORMIN 500 MG 24 hr tablet Commonly known as: GLUCOPHAGE-XR Take 1,000 mg by mouth at bedtime.   montelukast 10 MG tablet Commonly known as: SINGULAIR Take 10 mg by mouth at bedtime.   omeprazole 40 MG capsule Commonly known as: PRILOSEC Take 1 capsule (40 mg total) by mouth daily. PT NEEDS NEW PCP FOR FURTHER REFILLS DR [name] RETIRED What changed: additional instructions   phenol 1.4 % Liqd Commonly known as: CHLORASEPTIC Use as  directed 1 spray in the mouth or throat as needed for throat irritation / pain.   Qvar RediHaler 40 MCG/ACT inhaler Generic drug: beclomethasone Inhale 1 puff into the lungs 2 (two) times daily. Make sure to rinse your mouth afterwards   ranolazine 500 MG 12 hr tablet Commonly known as: RANEXA Take 1 tablet (500 mg total) by mouth 2 (two) times daily.   rOPINIRole 4 MG tablet Commonly known as: REQUIP Take 4 mg by mouth at bedtime.   sertraline 50 MG tablet Commonly known as: ZOLOFT Take 1 tablet (50 mg total) by mouth daily.   simvastatin 40  MG tablet Commonly known as: ZOCOR Take 1 tablet (40 mg total) by mouth at bedtime. PT NEEDS NEW PCP FOR FURTHER REFILLS DR [name] RETIRED What changed: additional instructions   traZODone 100 MG tablet Commonly known as: DESYREL Take 100-200 mg by mouth at bedtime.   Trulicity 1.5 JE/5.6DJ Sopn Generic drug: Dulaglutide Inject 1.5 mg into the skin every 'Sunday.               Durable Medical Equipment  (From admission, onward)           Start     Ordered   09/04/19 1709  For home use only DME Walker rolling  Once    Question Answer Comment  Walker: With 5 Inch Wheels   Patient needs a walker to treat with the following condition Physical deconditioning      01' /20/21 1709            Disposition and follow-up:   Carrie White was discharged from [facility] in Good condition.  At the hospital follow up visit please address:  1.  Diabetes very uncontrolled. May benefit from diabetes educator or dietician  Resolution of SOB and wheezing  2.  Labs / imaging needed at time of follow-up: None   3.  Pending labs/ test needing follow-up: None   Follow-up Appointments: Follow-up Information     Thornton Dales I, MD. Go on 09/13/2019.   Specialty: Family Medicine Why: '@10' :40 with Pa Contact information: [address] [place] [address] [phone]         [facility] Follow up.   Why: rolling walker Contact information: [place] [address] [phone]            Hospital Course by problem list: 1. Acute Respiratory Distress 2/2 Viral Bronchitis  Carrie White presented to the ED with 3 days of sore throat, congestion, nonproductive cough, pleuritic CP with cough as well as N/V/D, admitted for acute respiratory distress with significant tachypnea and increased work of breathing. CXR was clear, so unlikely pneumonia. RVP, Covid and influenza negative. Mild leukocytosis with left  shift that resolved by discharge. Patient did not require oxygen at any time during admission. Remained afebrile and hemodynamically stable. Tachypnea resolved. Supportive care included Robitussin, Tessalon pearls, Dulera and Pulmicort. Discharged with Robitussin, Tessalon Pearls and Qvar, as well as recommendation to follow up with PCP closely. PT recommended HH due to physical deconditioning associated with this recent illness.   2. Reactive Airway Disease On admission, patient had notable inspiratory and expiratory wheezing. Per chart review, patient had reactive asthma with her last hospitalization for pneumonia approximately 1 year ago. She has followed up Pulmonology since then, who feel her asthma/wheezing has resolved as spirometry in 09/2018 had normalized. During this admission, symptoms improved with Duonebs every 6 hours, as well as  Dulera and Pulmicort. She was discharged with Albuterol PRN for acute wheezing and Qvar daily.   Discharge Vitals:   BP (!) 112/58 (BP Location: Left Arm)   Pulse (!) 109   Temp 98.1 F (36.7 C) (Oral)   Resp 18   Ht '5\' 2"'  (1.575 m)   Wt 91.5 kg   SpO2 100%   BMI 36.91 kg/m   Pertinent Labs, Studies, and Procedures:   CBC Latest Ref Rng & Units 09/05/2019 09/04/2019 09/03/2019  WBC 4.0 - 10.5 K/uL 8.7 13.0(H) 12.7(H)  Hemoglobin 12.0 - 15.0 g/dL 11.9(L) 11.9(L) 13.3  Hematocrit 36.0 - 46.0 % 35.0(L) 35.8(L) 39.0  Platelets 150 - 400 K/uL 203 190 205   BMP Latest Ref Rng & Units 09/05/2019 09/04/2019 09/03/2019  Glucose 70 - 99 mg/dL 250(H) 204(H) 348(H)  BUN 8 - 23 mg/dL '13 8 9  ' Creatinine 0.44 - 1.00 mg/dL 0.87 0.77 0.86  BUN/Creat Ratio 12 - 28 - - -  Sodium 135 - 145 mmol/L 135 136 133(L)  Potassium 3.5 - 5.1 mmol/L 3.4(L) 3.3(L) 4.1  Chloride 98 - 111 mmol/L 97(L) 98 96(L)  CO2 22 - 32 mmol/L '29 27 26  ' Calcium 8.9 - 10.3 mg/dL 9.0 8.7(L) 8.9   CXR - 09/03/2019:  IMPRESSION: No acute process in the chest.  Two view CXR - 09/04/2019:    IMPRESSION: Stable mild cardiomegaly without pulmonary edema. No active pulmonary disease  Discharge Instructions: Discharge Instructions     Call MD for:  difficulty breathing, headache or visual disturbances   Complete by: As directed    Call MD for:  extreme fatigue   Complete by: As directed    Call MD for:  persistant dizziness or light-headedness   Complete by: As directed    Call MD for:  persistant nausea and vomiting   Complete by: As directed    Call MD for:  temperature >100.4   Complete by: As directed    Diet - low sodium heart healthy   Complete by: As directed    Discharge instructions   Complete by: As directed    It was a pleasuring meeting your Mrs. Kittell.   You were admitted to the hospital for acute respiratory distress that is secondary to viral bronchitis. At home, it will be important to drink plenty of fluids and eat regularly. We have discharged you with two inhalers as well. One, Albuterol, is only when you are feeling short of breathe. The second, QVAR, please use twice a daily and wash your mouth out afterwards.   Please follow up with your primary care doctor in the next 1-2 weeks.   Increase activity slowly   Complete by: As directed    Increase activity slowly   Complete by: As directed        Signed: Dr. Jose Persia Internal Medicine PGY-1  Pager: [phone] 09/05/2019, 1:19 PM

## 2019-09-05 NOTE — TOC Transition Note (Signed)
Transition of Care [facility]) - CM/SW Discharge Note   Patient Details  Name: Carrie White MRN: [id number] Date of Birth: [birth year]  [facility] CM/SW Contact:  Zenon Mayo, RN Phone Number: 09/05/2019, 9:11 AM   Clinical Narrative:    Patient is for dc today, she will need HHPT and a rolling walker, Patient states she has had [place] at home before but could not remember the agency name, but she does not have a preference of which agency.  NCM made referral to [place] with [facility], awaiting to hear back to see if she can take referral.  NCM made referral to [facility] with [facility] for rolling walker, this will be brought to room prior to dc.    Final next level of care: [place] Barriers to Discharge: No Barriers Identified   Patient Goals and CMS Choice Patient states their goals for this hospitalization and ongoing recovery are:: get well CMS Medicare.gov Compare Post Acute Care list provided to:: Patient Choice offered to / list presented to : Patient  Discharge Placement                       Discharge Plan and Services                DME Arranged: Walker rolling DME Agency: [employer] Date DME Agency Contacted: 09/05/19 Time DME Agency Contacted: [phone] Representative spoke with at DME Agency: [place]: PT [place]: [facility] [employer] Date [place]: 09/05/19 Time [place]: 0911 Representative spoke with at [place]: [place] ([place]) Interventions     Readmission Risk Interventions No flowsheet data found.

## 2019-09-06 ENCOUNTER — Telehealth: Payer: Self-pay | Admitting: Pulmonary Disease

## 2019-09-08 LAB — CULTURE, BLOOD (ROUTINE X 2)
Culture: NO GROWTH
Culture: NO GROWTH
Special Requests: ADEQUATE
Special Requests: ADEQUATE

## 2019-09-08 NOTE — Progress Notes (Signed)
'@Patient'  ID: Carrie White, female    DOB: 26-Dec-1940, 79 y.o.   MRN: [id number]  Chief Complaint  Patient presents with  . Follow-up     Productive Cough, SOB, emesis, sore throat improving    Referring provider: Lauraine Rinne, MD  HPI:  79 year old female former smoker initially consulted with our office on 08/24/2018 for management of suspected asthma  PMH: Hypertension, type 2 diabetes Smoker/ Smoking History: Former smoker.  Smoked socially.  Quit 1970 Maintenance: Symbicort 160 Pt of: Dr. Lake Bells  09/09/2019  - Visit   79 year old female former smoker (smokes socially) followed in our office for suspected asthma.  She was last seen in our office on 10/18/2018.  She is a former patient of Dr. Lake Bells.  Patient is presenting to our office today as a hospital follow-up.  Patient was hospitalized on 09/03/2019 for SIRS.  Patient was discharged on 09/05/2019.  She was treated as an acute bronchitis.  Flu and Covid test negative.  Blood sugars on admission were severely elevated greater than 400.  She was instructed to schedule immediate follow-up with endocrinology and primary care.  Endocrinology initially was scheduled to meet with the patient on 09/06/2019 but this was canceled due to an emergency you for her with her endocrinologist.  She is scheduled to follow back up with them in February.  She has upcoming primary care follow-up on 09/10/2019.  Patient reports that she is still coughing but she does feel this is improved some.  She does not feel that over-the-counter cough medicines help her.  She does feel that Tessalon Perles help she is using the 100 mg dose.  She continues to use her Qvar inhaler.  She is also using her DuoNeb nebulizer every 4 hours.  Blood sugars today at fasting were 370.  She continues to feel lousy.  Patient continues to have a wheeze.  She does not have formal pulmonary function testing on file  Tests:   09/19/2018-office spirometry-FVC 2.0 (76%  predicted), FEV1 1.6 (79% predicted), ratio 78  08/24/2018- resp viral panel > negative  08/24/2018- CT angiogram chest images independently reviewed from August 24, 2018: No pulmonary embolism, no significant pulmonary parenchymal abnormality noted, some coronary calcifications  08/25/2018-echocardiogram-LV ejection fraction 55 to 25%, grade 1 diastolic dysfunction, normal pulmonary artery systolic pressure  12/09/621-[id number] panel-SARS-CoV-2, negative, influenza AMB-negative 09/03/2019-D-dimer-1.48  09/03/2019-chest x-ray-no acute process in the chest 09/04/2019-chest x-ray-stable cardiomegaly without pulmonary edema, no active cardiopulmonary disease   FENO:  Lab Results  Component Value Date   NITRICOXIDE 23 09/19/2018    PFT: No flowsheet data found.  WALK:  No flowsheet data found.  Imaging: X-ray chest PA and lateral  Result Date: 09/04/2019 CLINICAL DATA:  Cough, dyspnea, sore throat EXAM: CHEST - 2 VIEW COMPARISON:  Chest radiograph from one day prior. FINDINGS: Stable cardiomediastinal silhouette with mild cardiomegaly. No pneumothorax. No pleural effusion. Lungs appear clear, with no acute consolidative airspace disease and no pulmonary edema. IMPRESSION: Stable mild cardiomegaly without pulmonary edema. No active pulmonary disease. Electronically Signed   By: Ilona Sorrel M.D.   On: 09/04/2019 08:26   DG Chest Port 1 View  Result Date: 09/03/2019 CLINICAL DATA:  Cough, sore throat EXAM: PORTABLE CHEST 1 VIEW COMPARISON:  09/19/2018 FINDINGS: No new consolidation or edema. Stable cardiomediastinal contours with mild cardiomegaly. No pleural effusion or pneumothorax. IMPRESSION: No acute process in the chest. Electronically Signed   By: Macy Mis M.D.   On: 09/03/2019 13:44    Lab  Results:  CBC    Component Value Date/Time   WBC 8.7 09/05/2019 1029   RBC 3.92 09/05/2019 1029   HGB 11.9 (L) 09/05/2019 1029   HCT 35.0 (L) 09/05/2019 1029   PLT 203 09/05/2019  1029   MCV 89.3 09/05/2019 1029   MCH 30.4 09/05/2019 1029   MCHC 34.0 09/05/2019 1029   RDW 12.0 09/05/2019 1029   LYMPHSABS 1.4 09/03/2019 1345   MONOABS 1.0 09/03/2019 1345   EOSABS 0.2 09/03/2019 1345   BASOSABS 0.0 09/03/2019 1345    BMET    Component Value Date/Time   NA 135 09/05/2019 1029   NA 139 09/13/2018 1001   K 3.4 (L) 09/05/2019 1029   CL 97 (L) 09/05/2019 1029   CO2 29 09/05/2019 1029   GLUCOSE 250 (H) 09/05/2019 1029   BUN 13 09/05/2019 1029   BUN 17 09/13/2018 1001   CREATININE 0.87 09/05/2019 1029   CALCIUM 9.0 09/05/2019 1029   GFRNONAA >60 09/05/2019 1029   GFRAA >60 09/05/2019 1029    BNP    Component Value Date/Time   BNP 22.2 08/24/2018 1931    ProBNP    Component Value Date/Time   PROBNP 50 09/13/2018 1001    Specialty Problems      Pulmonary Problems   Chronic obstructive asthma with acute exacerbation of asthma ([place])    Suspected but no formal pulmonary function testing  09/19/2018-office spirometry-FVC 2.0 (76% predicted), FEV1 1.6 (79% predicted), ratio 78 09/19/2018-FeNO-23      Dyspnea on exertion   Lower respiratory tract infection   Acute respiratory failure (HCC)   Reactive airway disease   Cough      No Known Allergies  Immunization History  Administered Date(s) Administered  . Influenza Split 06/05/2013  . Influenza Whole 05/18/2009, 05/06/2010  . Influenza, High Dose Seasonal PF 05/28/2018, 05/16/2019  . Influenza-Unspecified 05/16/2011  . Pneumococcal Polysaccharide-23 06/10/2004, 10/22/2012  . Td 06/10/2004    Past Medical History:  Diagnosis Date  . Complication of anesthesia   . Depression   . Diabetes mellitus    type II- uncontrolled  . FIBROMYALGIA 05/07/2007  . Fibromyalgia   . GERD (gastroesophageal reflux disease)   . GOITER, MULTINODULAR 07/01/2010  . HYPERLIPIDEMIA 07/31/2008  . HYPERTENSION 08/01/2008  . INSOMNIA, CHRONIC 06/29/2009  . Intraductal papilloma of breast 07/12/2011  . Lower  respiratory tract infection 09/04/2019  . OSTEOARTHRITIS 05/07/2007  . PONV (postoperative nausea and vomiting)   . RESTLESS LEG SYNDROME 05/07/2007    Tobacco History: Social History   Tobacco Use  Smoking Status Former Smoker  . Types: Cigarettes  . Quit date: 10/28/1968  . Years since quitting: 50.8  Smokeless Tobacco Never Used  Tobacco Comment   only smoke 1 pack per Month when she did smoke more socially 09/10/18   Counseling given: Yes Comment: only smoke 1 pack per Month when she did smoke more socially 09/10/18   Continue to not smoke  Outpatient Encounter Medications as of 09/09/2019  Medication Sig  . ACCU-CHEK SOFTCLIX LANCETS lancets Use to test blood sugar 2 times daily as instructed. Dx code: 250.61  . albuterol (VENTOLIN HFA) 108 (90 Base) MCG/ACT inhaler Inhale 1-2 puffs into the lungs every 6 (six) hours as needed for wheezing or shortness of breath.  Marland Kitchen aspirin EC 81 MG EC tablet Take 1 tablet (81 mg total) by mouth daily.  . BD ULTRA-FINE PEN NEEDLES 29G X 12.7MM MISC USE DAILY  . beclomethasone (QVAR REDIHALER) 40 MCG/ACT inhaler Inhale 1  puff into the lungs 2 (two) times daily. Make sure to rinse your mouth afterwards  . Blood Glucose Monitoring Suppl (ACCU-CHEK AVIVA PLUS) W/DEVICE KIT Use to check blood sugar daily. Dx code: 250.61  . DULoxetine (CYMBALTA) 30 MG capsule Take 30 mg by mouth daily.  . ferrous sulfate 325 (65 FE) MG tablet Take 325 mg by mouth daily with breakfast.  . furosemide (LASIX) 20 MG tablet Take 40 mg by mouth daily.  Marland Kitchen gabapentin (NEURONTIN) 300 MG capsule Take 300 mg by mouth at bedtime.  Marland Kitchen glimepiride (AMARYL) 4 MG tablet Take 4 mg by mouth 2 (two) times daily.  Marland Kitchen glucose blood (ACCU-CHEK AVIVA PLUS) test strip Use to test blood sugar 2 times daily as instructed. Dx code: 250.61  . Glucose Blood (BAYER BREEZE 2 TEST) DISK TEST 2 TIMES PER DAY  . Insulin Lispro Prot & Lispro (HUMALOG MIX 75/25 KWIKPEN) (75-25) 100 UNIT/ML Kwikpen Inject  50 Units into the skin 2 (two) times daily.  Marland Kitchen ipratropium-albuterol (DUONEB) 0.5-2.5 (3) MG/3ML SOLN Take 3 mLs by nebulization every 6 (six) hours. (Patient taking differently: Take 3 mLs by nebulization every 6 (six) hours as needed (shortness of breath). )  . lisinopril (ZESTRIL) 10 MG tablet Take 10 mg by mouth daily.  . meloxicam (MOBIC) 7.5 MG tablet Take 7.5 mg by mouth daily.  . metFORMIN (GLUCOPHAGE-XR) 500 MG 24 hr tablet Take 1,000 mg by mouth at bedtime.  Marland Kitchen omeprazole (PRILOSEC) 40 MG capsule Take 1 capsule (40 mg total) by mouth daily. PT NEEDS NEW PCP FOR FURTHER REFILLS DR [name] RETIRED (Patient taking differently: Take 40 mg by mouth daily. )  . rOPINIRole (REQUIP) 4 MG tablet Take 4 mg by mouth at bedtime.   . simvastatin (ZOCOR) 40 MG tablet Take 1 tablet (40 mg total) by mouth at bedtime. PT NEEDS NEW PCP FOR FURTHER REFILLS DR [name] RETIRED (Patient taking differently: Take 40 mg by mouth at bedtime. )  . traZODone (DESYREL) 100 MG tablet Take 100-200 mg by mouth at bedtime.  . TRULICITY 1.5 AU/6.3FH SOPN Inject 1.5 mg into the skin every Sunday.  . [DISCONTINUED] benzonatate (TESSALON) 100 MG capsule Take 1 capsule (100 mg total) by mouth 3 (three) times daily as needed for cough.  . [DISCONTINUED] hydrOXYzine (VISTARIL) 25 MG capsule Can take 1 or 2 tablets every 6 hours as needed for anxiety. This medication is sedating.  . [DISCONTINUED] sertraline (ZOLOFT) 50 MG tablet Take 1 tablet (50 mg total) by mouth daily.  . benzonatate (TESSALON) 200 MG capsule Take 1 capsule (200 mg total) by mouth 3 (three) times daily as needed for cough.  Marland Kitchen guaiFENesin-dextromethorphan (ROBITUSSIN DM) 100-10 MG/5ML syrup Take 5 mLs by mouth every 6 (six) hours as needed for cough. (Patient not taking: Reported on 09/09/2019)  . montelukast (SINGULAIR) 10 MG tablet Take 10 mg by mouth at bedtime.  . phenol (CHLORASEPTIC) 1.4 % LIQD Use as directed 1 spray in the mouth or throat as needed for  throat irritation / pain. (Patient not taking: Reported on 09/09/2019)  . [DISCONTINUED] ranolazine (RANEXA) 500 MG 12 hr tablet Take 1 tablet (500 mg total) by mouth 2 (two) times daily. (Patient not taking: Reported on 09/09/2019)   No facility-administered encounter medications on file as of 09/09/2019.     Review of Systems  Review of Systems  Constitutional: Positive for fatigue. Negative for activity change and fever.  HENT: Negative for sinus pressure, sinus pain and sore throat.   Respiratory:  Positive for cough, shortness of breath and wheezing.   Cardiovascular: Positive for leg swelling. Negative for chest pain and palpitations.  Gastrointestinal: Negative for diarrhea, nausea and vomiting.  Endocrine:       09/09/19 - FBS - 370  Musculoskeletal: Negative for arthralgias.  Neurological: Negative for dizziness.  Psychiatric/Behavioral: Negative for sleep disturbance. The patient is not nervous/anxious.      Physical Exam  BP 120/62 (BP Location: Right Arm, Cuff Size: Normal)   Pulse 83   Temp 97.6 F (36.4 C) (Temporal)   Ht 5' 2.5" (1.588 m)   Wt 204 lb 9.6 oz (92.8 kg)   SpO2 96% Comment: RA  BMI 36.83 kg/m   Wt Readings from Last 5 Encounters:  09/09/19 204 lb 9.6 oz (92.8 kg)  09/05/19 201 lb 12.8 oz (91.5 kg)  10/18/18 210 lb (95.3 kg)  09/19/18 210 lb 9.6 oz (95.5 kg)  09/13/18 212 lb 1.9 oz (96.2 kg)    BMI Readings from Last 5 Encounters:  09/09/19 36.83 kg/m  09/05/19 36.91 kg/m  10/18/18 37.20 kg/m  09/19/18 37.31 kg/m  09/13/18 38.18 kg/m     Physical Exam Vitals and nursing note reviewed.  Constitutional:      General: She is not in acute distress.    Appearance: Normal appearance. She is obese.     Comments:   Chronically ill elderly female  HENT:     Head: Normocephalic and atraumatic.     Right Ear: External ear normal.     Left Ear: External ear normal.     Nose: Nose normal. No congestion.     Mouth/Throat:     Mouth: Mucous  membranes are moist.     Pharynx: Oropharynx is clear.  Eyes:     Pupils: Pupils are equal, round, and reactive to light.  Cardiovascular:     Rate and Rhythm: Normal rate and regular rhythm.     Pulses: Normal pulses.     Heart sounds: Normal heart sounds. No murmur.  Pulmonary:     Effort: No respiratory distress.     Breath sounds: No decreased air movement. Wheezing (Predominantly upper airway) present. No decreased breath sounds or rales.     Comments: Recurrent dry cough and clearing of throat throughout exam Musculoskeletal:     Cervical back: Normal range of motion.     Right lower leg: Edema (1+) present.     Left lower leg: Edema (1+) present.  Skin:    General: Skin is warm and dry.     Capillary Refill: Capillary refill takes less than 2 seconds.  Neurological:     General: No focal deficit present.     Mental Status: She is alert and oriented to person, place, and time. Mental status is at baseline.     Gait: Gait normal.  Psychiatric:        Mood and Affect: Mood normal.        Behavior: Behavior normal.        Thought Content: Thought content normal.        Judgment: Judgment normal.       Assessment & Plan:   Chronic obstructive asthma with acute exacerbation of asthma (HCC) Plan: Continue Qvar We will establish care with Dr. Vaughan Browner and 30-minute time slot sometime over the next 4 weeks Start Tessalon Perles 200 mg Okay to continue DuoNeb nebulized medication every 4-6 hours as we cannot give you steroids with the elevated blood sugars that you have  Likely  need to get patient rescheduled for pulmonary function testing We will coordinate appointment with the clinical pharmacy team to evaluate inhalers  Lower respiratory tract infection Plan: We will continue to monitor Tessalon Perles 200 mg prescribed today Unable to consider steroids at this time due to elevated fasting blood sugars this morning  Diabetes mellitus ([place]) Poorly controlled Fasting  blood sugars today 370 ?  Patient listed as type II as well as type I diabetic in her chart  Plan: Patient needs to follow-up with primary care and endocrinology Emphasized the need for immediate close follow-up and to get obtain better control of her blood sugars  Cough Plan: Start Tessalon Perles 200 mg every 8hrs as needed for cough  Medication management Plan: Coordinate appointment with clinical pharmacy team for inhaler evaluation Patient also needs medication reconciliation     Return in about 4 weeks (around 10/07/2019), or if symptoms worsen or fail to improve, for Follow up with Dr. Vaughan Browner, clinical pharmacy team appt.   Lauraine Rinne, NP 09/09/2019   This appointment required 42 minutes of patient care (this includes precharting, chart review, review of results, face-to-face care, etc.).

## 2019-09-09 ENCOUNTER — Encounter: Payer: Self-pay | Admitting: Pulmonary Disease

## 2019-09-09 ENCOUNTER — Ambulatory Visit: Payer: Medicare PPO | Admitting: Pulmonary Disease

## 2019-09-09 ENCOUNTER — Other Ambulatory Visit: Payer: Self-pay

## 2019-09-09 VITALS — BP 120/62 | HR 83 | Temp 97.6°F | Ht 62.5 in | Wt 204.6 lb

## 2019-09-09 DIAGNOSIS — E1065 Type 1 diabetes mellitus with hyperglycemia: Secondary | ICD-10-CM | POA: Diagnosis not present

## 2019-09-09 DIAGNOSIS — J22 Unspecified acute lower respiratory infection: Secondary | ICD-10-CM

## 2019-09-09 DIAGNOSIS — J440 Chronic obstructive pulmonary disease with acute lower respiratory infection: Secondary | ICD-10-CM | POA: Diagnosis not present

## 2019-09-09 DIAGNOSIS — Z79899 Other long term (current) drug therapy: Secondary | ICD-10-CM

## 2019-09-09 DIAGNOSIS — Z7951 Long term (current) use of inhaled steroids: Secondary | ICD-10-CM

## 2019-09-09 DIAGNOSIS — R05 Cough: Secondary | ICD-10-CM | POA: Insufficient documentation

## 2019-09-09 DIAGNOSIS — R059 Cough, unspecified: Secondary | ICD-10-CM | POA: Insufficient documentation

## 2019-09-09 DIAGNOSIS — J441 Chronic obstructive pulmonary disease with (acute) exacerbation: Secondary | ICD-10-CM

## 2019-09-09 DIAGNOSIS — J4489 Other specified chronic obstructive pulmonary disease: Secondary | ICD-10-CM

## 2019-09-09 DIAGNOSIS — Z87891 Personal history of nicotine dependence: Secondary | ICD-10-CM

## 2019-09-09 MED ORDER — BENZONATATE 200 MG PO CAPS: 200.0000 mg | ORAL_CAPSULE | Freq: Three times a day (TID) | ORAL | 2 refills | Status: DC | PRN

## 2019-09-09 NOTE — Assessment & Plan Note (Signed)
Plan: Start Tessalon Perles 200 mg every 8hrs as needed for cough

## 2019-09-09 NOTE — Patient Instructions (Addendum)
You were seen today by Lauraine Rinne, NP  for:   1. Chronic obstructive asthma with acute exacerbation of asthma ([place])  Continue QVAR   Please present to our office in 2-4 weeks for an appointment with the clinical pharmacy team for:   Marland Kitchen Medication Management  . Medication reconciliation  . Medication Access  . Inhaler teaching - QVAR    If you have worsening symptoms with your breathing or you feel like symptoms or not improving please contact our office so that way we can further assess and work with you  2. Cough  - benzonatate (TESSALON) 200 MG capsule; Take 1 capsule (200 mg total) by mouth 3 (three) times daily as needed for cough.  Dispense: 90 capsule; Refill: 2  3. Lower respiratory tract infection  - benzonatate (TESSALON) 200 MG capsule; Take 1 capsule (200 mg total) by mouth 3 (three) times daily as needed for cough.  Dispense: 90 capsule; Refill: 2  Slow to resolve bronchitis, Continue use Tessalon Perles  4. Type 1 diabetes mellitus with hyperglycemia (HCC)  Complete follow-up with primary care  Complete follow-up with endocrinology  Notify primary care tomorrow of your blood sugars being elevated   We recommend today:   Meds ordered this encounter  Medications  . benzonatate (TESSALON) 200 MG capsule    Sig: Take 1 capsule (200 mg total) by mouth 3 (three) times daily as needed for cough.    Dispense:  90 capsule    Refill:  2    Follow Up:    Return in about 4 weeks (around 10/07/2019), or if symptoms worsen or fail to improve, for Follow up with Dr. Vaughan Browner, clinical pharmacy team appt.   Please do your part to reduce the spread of COVID-19:      Reduce your risk of any infection  and COVID19 by using the similar precautions used for avoiding the common cold or flu:  Marland Kitchen Wash your hands often with soap and warm water for at least 20 seconds.  If soap and water are not readily available, use an alcohol-based hand sanitizer with at least 60% alcohol.   . If coughing or sneezing, cover your mouth and nose by coughing or sneezing into the elbow areas of your shirt or coat, into a tissue or into your sleeve (not your hands). Langley Gauss A MASK when in public  . Avoid shaking hands with others and consider head nods or verbal greetings only. . Avoid touching your eyes, nose, or mouth with unwashed hands.  . Avoid close contact with people who are sick. . Avoid places or events with large numbers of people in one location, like concerts or sporting events. . If you have some symptoms but not all symptoms, continue to monitor at home and seek medical attention if your symptoms worsen. . If you are having a medical emergency, call 911.   [place] / e-Visit: [email or web address]         [facility]: [facility]: [id number]                   [facility]: [id number]     It is flu season:   >>> Best ways to protect herself from the flu: Receive the yearly flu vaccine, practice good hand hygiene washing with soap and also using hand sanitizer when available, eat a nutritious meals, get adequate rest, hydrate appropriately   Please  contact the office if your symptoms worsen or you have concerns that you are not improving.   Thank you for choosing [facility] for your healthcare, and for allowing [place] to partner with you on your healthcare journey. I am thankful to be able to provide care to you today.   Wyn Quaker FNP-C

## 2019-09-09 NOTE — Assessment & Plan Note (Signed)
Poorly controlled Fasting blood sugars today 370 ?  Patient listed as type II as well as type I diabetic in her chart  Plan: Patient needs to follow-up with primary care and endocrinology Emphasized the need for immediate close follow-up and to get obtain better control of her blood sugars

## 2019-09-09 NOTE — Assessment & Plan Note (Signed)
Plan: We will continue to monitor Tessalon Perles 200 mg prescribed today Unable to consider steroids at this time due to elevated fasting blood sugars this morning

## 2019-09-09 NOTE — Assessment & Plan Note (Addendum)
Plan: Continue Qvar We will establish care with Dr. Vaughan Browner and 30-minute time slot sometime over the next 4 weeks Start Tessalon Perles 200 mg Okay to continue DuoNeb nebulized medication every 4-6 hours as we cannot give you steroids with the elevated blood sugars that you have  Likely need to get patient rescheduled for pulmonary function testing We will coordinate appointment with the clinical pharmacy team to evaluate inhalers

## 2019-09-09 NOTE — Assessment & Plan Note (Signed)
Plan: Coordinate appointment with clinical pharmacy team for inhaler evaluation Patient also needs medication reconciliation

## 2019-09-12 ENCOUNTER — Ambulatory Visit: Payer: Medicare Other

## 2019-09-13 NOTE — Telephone Encounter (Signed)
Error

## 2019-10-04 NOTE — Progress Notes (Signed)
*  Patient seen by the pharmacy team in conjunction with Dr. Vaughan Browner.  Please refer to office visit encounter on 10/09/19 for provider assessment and plan.Mariella Saa, PharmD, [place], CPP Clinical Specialty Pharmacist [phone]  10/09/2019 11:09 AM

## 2019-10-09 ENCOUNTER — Ambulatory Visit: Payer: Medicare PPO | Admitting: Pulmonary Disease

## 2019-10-09 ENCOUNTER — Telehealth: Payer: Self-pay | Admitting: Pharmacy Technician

## 2019-10-09 ENCOUNTER — Other Ambulatory Visit: Payer: Self-pay

## 2019-10-09 ENCOUNTER — Ambulatory Visit (INDEPENDENT_AMBULATORY_CARE_PROVIDER_SITE_OTHER): Payer: Medicare PPO | Admitting: Pharmacist

## 2019-10-09 ENCOUNTER — Encounter: Payer: Self-pay | Admitting: Pulmonary Disease

## 2019-10-09 DIAGNOSIS — Z79899 Other long term (current) drug therapy: Secondary | ICD-10-CM

## 2019-10-09 DIAGNOSIS — R0602 Shortness of breath: Secondary | ICD-10-CM | POA: Diagnosis not present

## 2019-10-09 LAB — CBC WITH DIFFERENTIAL/PLATELET
Basophils Absolute: 0.1 10*3/uL (ref 0.0–0.1)
Basophils Relative: 0.7 % (ref 0.0–3.0)
Eosinophils Absolute: 0.1 10*3/uL (ref 0.0–0.7)
Eosinophils Relative: 0.9 % (ref 0.0–5.0)
HCT: 44.5 % (ref 36.0–46.0)
Hemoglobin: 14.7 g/dL (ref 12.0–15.0)
Lymphocytes Relative: 23.4 % (ref 12.0–46.0)
Lymphs Abs: 2.7 10*3/uL (ref 0.7–4.0)
MCHC: 33.1 g/dL (ref 30.0–36.0)
MCV: 91 fl (ref 78.0–100.0)
Monocytes Absolute: 0.6 10*3/uL (ref 0.1–1.0)
Monocytes Relative: 5 % (ref 3.0–12.0)
Neutro Abs: 8 10*3/uL — ABNORMAL HIGH (ref 1.4–7.7)
Neutrophils Relative %: 70 % (ref 43.0–77.0)
Platelets: 306 10*3/uL (ref 150.0–400.0)
RBC: 4.89 Mil/uL (ref 3.87–5.11)
RDW: 13.2 % (ref 11.5–15.5)
WBC: 11.4 10*3/uL — ABNORMAL HIGH (ref 4.0–10.5)

## 2019-10-09 LAB — BRAIN NATRIURETIC PEPTIDE: Pro B Natriuretic peptide (BNP): 11 pg/mL (ref 0.0–100.0)

## 2019-10-09 MED ORDER — AMLODIPINE BESYLATE 5 MG PO TABS: 5.0000 mg | ORAL_TABLET | Freq: Every day | ORAL | 11 refills | Status: DC

## 2019-10-09 MED ORDER — BUDESONIDE-FORMOTEROL FUMARATE 160-4.5 MCG/ACT IN AERO: 2.0000 | INHALATION_SPRAY | Freq: Two times a day (BID) | RESPIRATORY_TRACT | 3 refills | Status: DC

## 2019-10-09 NOTE — Progress Notes (Signed)
Subjective:  Patient presents today to [facility] to see pharmacy team for inhaler education.  Past medical history includes .  She was discharged on QVAR but is non-formulary and will no longer covered by insurance.   Respiratory Medications Current: Qvar, Duoneb, albuterol rescue, and Singulair Tried in past: patient can not recall Patient reports adherence challenges   Objective: No Known Allergies  Outpatient Encounter Medications as of 10/09/2019  Medication Sig Note  . ACCU-CHEK SOFTCLIX LANCETS lancets Use to test blood sugar 2 times daily as instructed. Dx code: 250.61   . albuterol (VENTOLIN HFA) 108 (90 Base) MCG/ACT inhaler Inhale 1-2 puffs into the lungs every 6 (six) hours as needed for wheezing or shortness of breath. 10/09/2019: Uses 2 times a day.  She has decreased from 4 to 6 times a day.  Marland Kitchen aspirin EC 81 MG EC tablet Take 1 tablet (81 mg total) by mouth daily.   . BD ULTRA-FINE PEN NEEDLES 29G X 12.7MM MISC USE DAILY   . beclomethasone (QVAR REDIHALER) 40 MCG/ACT inhaler Inhale 1 puff into the lungs 2 (two) times daily. Make sure to rinse your mouth afterwards (Patient not taking: Reported on 10/09/2019)   . benzonatate (TESSALON) 200 MG capsule Take 1 capsule (200 mg total) by mouth 3 (three) times daily as needed for cough. 10/09/2019: Uses 3 times a day  . Blood Glucose Monitoring Suppl (ACCU-CHEK AVIVA PLUS) W/DEVICE KIT Use to check blood sugar daily. Dx code: 250.61   . DULoxetine (CYMBALTA) 30 MG capsule Take 30 mg by mouth daily.   . empagliflozin (JARDIANCE) 25 MG TABS tablet Take by mouth.   . ferrous sulfate 325 (65 FE) MG tablet Take 325 mg by mouth daily with breakfast.   . furosemide (LASIX) 20 MG tablet Take 40 mg by mouth daily.   Marland Kitchen gabapentin (NEURONTIN) 300 MG capsule Take 300 mg by mouth at bedtime.   Marland Kitchen glimepiride (AMARYL) 4 MG tablet Take 4 mg by mouth 2 (two) times daily.   Marland Kitchen glucose blood (ACCU-CHEK AVIVA PLUS) test strip Use to test blood  sugar 2 times daily as instructed. Dx code: 250.61   . Glucose Blood (BAYER BREEZE 2 TEST) DISK TEST 2 TIMES PER DAY   . Insulin Lispro Prot & Lispro (HUMALOG MIX 75/25 KWIKPEN) (75-25) 100 UNIT/ML Kwikpen Inject 50 Units into the skin 2 (two) times daily.   Marland Kitchen ipratropium-albuterol (DUONEB) 0.5-2.5 (3) MG/3ML SOLN Take 3 mLs by nebulization every 6 (six) hours. (Patient taking differently: Take 3 mLs by nebulization every 6 (six) hours as needed (shortness of breath). ) 10/09/2019: Uses 2 times a day.  . lisinopril (ZESTRIL) 10 MG tablet Take 10 mg by mouth daily.   . meloxicam (MOBIC) 7.5 MG tablet Take 7.5 mg by mouth daily.   . metFORMIN (GLUCOPHAGE-XR) 500 MG 24 hr tablet Take 1,000 mg by mouth at bedtime.   . montelukast (SINGULAIR) 10 MG tablet Take 10 mg by mouth at bedtime.   Marland Kitchen omeprazole (PRILOSEC) 40 MG capsule Take 1 capsule (40 mg total) by mouth daily. PT NEEDS NEW PCP FOR FURTHER REFILLS DR [name] RETIRED (Patient taking differently: Take 40 mg by mouth daily. )   . rOPINIRole (REQUIP) 4 MG tablet Take 4 mg by mouth at bedtime.    . simvastatin (ZOCOR) 40 MG tablet Take 1 tablet (40 mg total) by mouth at bedtime. PT NEEDS NEW PCP FOR FURTHER REFILLS DR [name] RETIRED (Patient taking differently: Take 40 mg by mouth at  bedtime. )   . traZODone (DESYREL) 100 MG tablet Take 100-200 mg by mouth at bedtime as needed.  10/09/2019: Using almost every night.  . TRULICITY 1.5 GY/1.7CB SOPN Inject 1.5 mg into the skin every Sunday.    No facility-administered encounter medications on file as of 10/09/2019.     Immunization History  Administered Date(s) Administered  . Influenza Split 06/05/2013  . Influenza Whole 05/18/2009, 05/06/2010  . Influenza, High Dose Seasonal PF 05/23/2016, 05/10/2017, 05/28/2018, 05/10/2019  . Influenza-Unspecified 05/16/2011  . Pneumococcal Conjugate-13 05/23/2016  . Pneumococcal Polysaccharide-23 06/10/2004, 10/22/2012  . Td 06/10/2004  . Tdap 05/25/2017  .  Zoster Recombinat (Shingrix) 04/23/2019, 07/16/2019     PFTs No flowsheet data found.   PIFR  10/09/2019  Inspiratory flow measured using the In-check DIAL G16 and was in range of 30-90 for use of Medium DPI device (Ellipta). Patient scored 95.  Inspiratory flow measured using the In-check DIAL G16 and was in range of 30-90 for use of Low DPI device (HFA). Patient scored 50.   Assessment and Plan  1. Inhaler Optimization a. Optimal inhaler for patient would be MDI/HFA considering PIFR. i. Patient was counseled on the purpose, proper use, and adverse effects of ICS inhaler.  Instructed patient to rinse mouth with water after using in order to prevent fungal infection.  Patient verbalized understanding. ii. Reviewed appropriate use of maintenance vs rescue inhalers.  Stressed importance of using maintenance inhaler daily and rescue inhaler only as needed.  Patient verbalized understanding.   2. Medication Reconciliation a. A drug regimen assessment was performed, including review of allergies, interactions, disease-state management, dosing and immunization history. Medications were reviewed with the patient, including name, instructions, indication, goals of therapy, potential side effects, importance of adherence, and safe use. b. Drug interaction(s):  i. Furosemide and Mobic-increased risk of nephrotoxicity 1. Serum creatinine and GFR within normal limits on 09/05/2019 ii. Cymbalta and trazodone in combination with Mobic: increased risk of bleeding  3. Immunizations a. Patient is up to date with influenzae, pneumonia, and shingles vaccinations.  She also has received her Covid-19 vaccines on 08-19-19 and 09-09-19 b. She is eligible to receive another Pneumovax 23 vaccine if needed.  Defer to Dr. Vaughan Browner or PCP.  All questions encouraged and answered.  Instructed patient to call with any questions or concerns.  Thank you for allowing pharmacy to participate in this patient's  care.   Mariella Saa, PharmD, [place], [place] [facility] Pharmacist [phone]  10/09/2019 11:11 AM

## 2019-10-09 NOTE — Progress Notes (Signed)
Carrie White    [id number]    1941-05-19  Primary Care Physician:Moreira, Carleene Overlie, MD  Referring Physician: Lauraine Rinne, [place] [address] [place],  [place] [id number]  Chief complaint: Consult for asthma  HPI: 79 year old with history of allergies, hypertension, asthma, diabetes Had an admission in January 2020 for acute respiratory failure which is thought to be due to asthma/COPD exacerbation.  Readmitted in January 2021 with respiratory failure, viral bronchitis, SARS-CoV-2 and respiratory virus panel, flu were -negative  Post discharge she continues to have significant congestion, cough, throat irritation.  She is currently on Qvar and duo nebs.  She also has GERD for which she is taking Prilosec once daily  Pets: No pets or bird exposure.  Occupation:  Exposures: No mold, hot tub, Jacuzzi.  Has down comforter since 2016 Smoking history: Smoked socially, quit in 1970 Travel history: No significant travel history Relevant family history: No significant family history of lung disease  Outpatient Encounter Medications as of 10/09/2019  Medication Sig  . ACCU-CHEK SOFTCLIX LANCETS lancets Use to test blood sugar 2 times daily as instructed. Dx code: 250.61  . albuterol (VENTOLIN HFA) 108 (90 Base) MCG/ACT inhaler Inhale 1-2 puffs into the lungs every 6 (six) hours as needed for wheezing or shortness of breath.  Marland Kitchen aspirin EC 81 MG EC tablet Take 1 tablet (81 mg total) by mouth daily.  . BD ULTRA-FINE PEN NEEDLES 29G X 12.7MM MISC USE DAILY  . benzonatate (TESSALON) 200 MG capsule Take 1 capsule (200 mg total) by mouth 3 (three) times daily as needed for cough.  . Blood Glucose Monitoring Suppl (ACCU-CHEK AVIVA PLUS) W/DEVICE KIT Use to check blood sugar daily. Dx code: 250.61  . DULoxetine (CYMBALTA) 30 MG capsule Take 30 mg by mouth daily.  . empagliflozin (JARDIANCE) 25 MG TABS tablet Take by mouth.  . ferrous sulfate 325 (65 FE) MG tablet Take 325  mg by mouth daily with breakfast.  . furosemide (LASIX) 20 MG tablet Take 40 mg by mouth daily.  Marland Kitchen gabapentin (NEURONTIN) 300 MG capsule Take 300 mg by mouth at bedtime.  Marland Kitchen glimepiride (AMARYL) 4 MG tablet Take 4 mg by mouth 2 (two) times daily.  Marland Kitchen glucose blood (ACCU-CHEK AVIVA PLUS) test strip Use to test blood sugar 2 times daily as instructed. Dx code: 250.61  . Glucose Blood (BAYER BREEZE 2 TEST) DISK TEST 2 TIMES PER DAY  . Insulin Lispro Prot & Lispro (HUMALOG MIX 75/25 KWIKPEN) (75-25) 100 UNIT/ML Kwikpen Inject 50 Units into the skin 2 (two) times daily.  Marland Kitchen ipratropium-albuterol (DUONEB) 0.5-2.5 (3) MG/3ML SOLN Take 3 mLs by nebulization every 6 (six) hours. (Patient taking differently: Take 3 mLs by nebulization every 6 (six) hours as needed (shortness of breath). )  . lisinopril (ZESTRIL) 10 MG tablet Take 10 mg by mouth daily.  . meloxicam (MOBIC) 7.5 MG tablet Take 7.5 mg by mouth daily.  . metFORMIN (GLUCOPHAGE-XR) 500 MG 24 hr tablet Take 1,000 mg by mouth at bedtime.  . montelukast (SINGULAIR) 10 MG tablet Take 10 mg by mouth at bedtime.  Marland Kitchen omeprazole (PRILOSEC) 40 MG capsule Take 1 capsule (40 mg total) by mouth daily. PT NEEDS NEW PCP FOR FURTHER REFILLS DR [name] RETIRED (Patient taking differently: Take 40 mg by mouth daily. )  . rOPINIRole (REQUIP) 4 MG tablet Take 4 mg by mouth at bedtime.   . simvastatin (ZOCOR) 40 MG tablet Take 1  tablet (40 mg total) by mouth at bedtime. PT NEEDS NEW PCP FOR FURTHER REFILLS DR [name] RETIRED (Patient taking differently: Take 40 mg by mouth at bedtime. )  . traZODone (DESYREL) 100 MG tablet Take 100-200 mg by mouth at bedtime as needed.   . TRULICITY 1.5 HU/3.1SH SOPN Inject 1.5 mg into the skin every Sunday.  . [DISCONTINUED] beclomethasone (QVAR REDIHALER) 40 MCG/ACT inhaler Inhale 1 puff into the lungs 2 (two) times daily. Make sure to rinse your mouth afterwards (Patient not taking: Reported on 10/09/2019)   No facility-administered  encounter medications on file as of 10/09/2019.    Allergies as of 10/09/2019  . (No Known Allergies)    Past Medical History:  Diagnosis Date  . Complication of anesthesia   . Depression   . Diabetes mellitus    type II- uncontrolled  . FIBROMYALGIA 05/07/2007  . Fibromyalgia   . GERD (gastroesophageal reflux disease)   . GOITER, MULTINODULAR 07/01/2010  . HYPERLIPIDEMIA 07/31/2008  . HYPERTENSION 08/01/2008  . INSOMNIA, CHRONIC 06/29/2009  . Intraductal papilloma of breast 07/12/2011  . Lower respiratory tract infection 09/04/2019  . OSTEOARTHRITIS 05/07/2007  . PONV (postoperative nausea and vomiting)   . RESTLESS LEG SYNDROME 05/07/2007    Past Surgical History:  Procedure Laterality Date  . ABDOMINAL HYSTERECTOMY    . BREAST MASS EXCISION  07/18/11   left breast  . BREAST SURGERY    . COLONOSCOPY    . Donovan OF UTERUS  1972  . MASTECTOMY, PARTIAL  07/18/2011  . MOUTH SURGERY  [id number]  . OOPHORECTOMY    . TOTAL KNEE ARTHROPLASTY Right 09/09/2013   DR Mayer Camel  . TOTAL KNEE ARTHROPLASTY Left 09/09/2013   Procedure: TOTAL KNEE ARTHROPLASTY- left;  Surgeon: Kerin Salen, MD;  Location: [facility];  Service: Orthopedics;  Laterality: Left;    Family History  Problem Relation Age of Onset  . Hypertension Mother   . Stroke Mother        CVA  . Cancer Father        Colon Cancer with Mets, Lung Cancer  . Leukemia Sister   . Diabetes Sister   . Cancer Sister        Breast Cancer  . Hypertension Brother   . Stroke Brother        CVA  . Cancer Brother        Prostate Cancer  . Cancer Sister        Breast Cancer  . Lupus Sister   . Diabetes Brother   . Thyroid disease Other        Brother (1974) thyroid surgery  . Thyroid disease Other        Sister (1981) thyroid surgery    Social History   Socioeconomic History  . Marital status: Widowed    Spouse name: Not on file  . Number of children: Not on file  . Years of education: Not on file  .  Highest education level: Not on file  Occupational History  . Not on file  Tobacco Use  . Smoking status: Former Smoker    Types: Cigarettes    Quit date: 10/28/1968    Years since quitting: 50.9  . Smokeless tobacco: Never Used  . Tobacco comment: only smoke 1 pack per Month when she did smoke more socially 09/10/18  Substance and Sexual Activity  . Alcohol use: No  . Drug use: No  . Sexual activity: Not Currently  Other Topics Concern  .  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 Difficulty of Paying Living Expenses: Not on file  Food Insecurity:   . Worried About Charity fundraiser in the Last Year: Not on file  . Ran Out of Food in the Last Year: Not on file  Transportation Needs:   . Lack of Transportation (Medical): Not on file  . Lack of Transportation (Non-Medical): Not on file  Physical Activity:   . Days of Exercise per Week: Not on file  . Minutes of Exercise per Session: Not on file  Stress:   . Feeling of Stress : Not on file  Social Connections:   . Frequency of Communication with Friends and Family: Not on file  . Frequency of Social Gatherings with Friends and Family: Not on file  . Attends Religious Services: Not on file  . Active Member of Clubs or Organizations: Not on file  . Attends Archivist Meetings: Not on file  . Marital Status: Not on file  Intimate Partner Violence:   . Fear of Current or Ex-Partner: Not on file  . Emotionally Abused: Not on file  . Physically Abused: Not on file  . Sexually Abused: Not on file    Review of systems: Review of Systems  Constitutional: Negative for fever and chills.  HENT: Negative.   Eyes: Negative for blurred vision.  Respiratory: as per HPI  Cardiovascular: Negative for chest pain and palpitations.  Gastrointestinal: Negative for  vomiting, diarrhea, blood per rectum. Genitourinary: Negative for dysuria, urgency, frequency and hematuria.  Musculoskeletal: Negative for myalgias, back pain and joint pain.  Skin: Negative for itching and rash.  Neurological: Negative for dizziness, tremors, focal weakness, seizures and loss of consciousness.  Endo/Heme/Allergies: Negative for environmental allergies.  Psychiatric/Behavioral: Negative for depression, suicidal ideas and hallucinations.  All other systems reviewed and are negative.  Physical Exam: Blood pressure 114/68, pulse 88, temperature 98 F (36.7 C), temperature source Temporal, height 5' 2.5" (1.588 m), weight 198 lb 9.6 oz (90.1 kg), SpO2 98 %. Gen:      No acute distress HEENT:  EOMI, sclera anicteric Neck:     No masses; no thyromegaly Lungs:    Clear to auscultation bilaterally; normal respiratory effort CV:         Regular rate and rhythm; no murmurs Abd:      + bowel sounds; soft, non-tender; no palpable masses, no distension Ext:    No edema; adequate peripheral perfusion Skin:      Warm and dry; no rash Neuro: alert and oriented x 3 Psych: normal mood and affect  Data Reviewed: Imaging: CTA 08/24/2018-no PE, mild dependent atelectasis.  Coronary atherosclerosis Chest x-ray 09/04/2019-mild cardiomegaly, no active cardiopulmonary disease.  PFTs: Spirometry 09/19/2018 FVC 2 [76], FEV1 1.6 [79%], F/F 78 Normal test  Labs: CBC 09/03/2019-WBC 12.7, eos 1%, absolute eosinophil count 127 IgE 09/19/2018-17  Cardiac: Echo 08/25/2018-LVEF 29-79%, grade 1 diastolic dysfunction, RVSP 26, PA systolic pressure within normal range  Assessment:  Respiratory failure, asthma Has a diagnosis of asthma but no obstruction on spirometry in the past Continues to have dyspnea with occasional wheeze, upper airway congestion Suspect she has a combination of GERD, ACE inhibitor induced cough. History is notable for down exposure for the past 4 years  We will stop the  Qvar  and change to Symbicort Check CBC, IgE, hypersensitivity panel Stop lisinopril and start Norvasc 5 mg for blood pressure control.  She has a follow-up with primary care in few weeks to recheck BP Schedule high-res CT, pulmonary function test She has a pharmacy visit today for inhaler training, med reconciliation and immunization check  GERD Increase Prilosec to 40 mg twice daily for a few weeks  Plan/Recommendations: Qvar, start Symbicort Increase Prilosec to 40 mg daily Stop lisinopril, start Norvasc 5 mg CBC, IgE, hypersensitivity panel, BNP High-res CT, PFTs  This appointment required 50 minutes of patient care (this includes precharting, chart review, review of results, face-to-face care, etc.).  Marshell Garfinkel MD [facility] 10/09/2019, 11:41 AM  CC: Lauraine Rinne, MD

## 2019-10-09 NOTE — Patient Instructions (Signed)
We will start you on Symbicort 162 puffs twice daily Increase Prilosec to 40 mg twice daily Stop the lisinopril [Zestril] We will start you on another blood pressure medication called Norvasc 5 mg daily Check CBC differential, IgE, BNP, hypersensitivity panel Schedule high-resolution CT, pulmonary function tests  Follow-up in 1 to 2 months.

## 2019-10-09 NOTE — Telephone Encounter (Signed)
Findings of benefits investigation via test claims at [facility]:   Insurance: [employer] Medicare D   Qvar - non-formulary  Asmanex HFA- #  1 inhaler for a 1 month supply through patient's insurance is $ 64.00. Not preferred  Flovent HFA- # 1 inhaler for a 1 month supply through patient's insurance is $ 40.00.  Arnuity Ellipta- # 1 inhaler for a 1 month supply through patient's insurance is $ 40.00.  Symbicort - # 1 inhaler for a 1 month supply through patient's insurance is $ 40.00.  Advair HFA- # 1 inhaler for a 1 month supply through patient's insurance is $ 40.00.  Dulera- #  1 inhaler for a 1 month supply through patient's insurance is $ 64.00. Not preferred  Budesonide nebs- billed through patient's Part C plan- patient's copay for 1 month supply is $50.00. Test claim did not reflect if deductible was applying to claim.  10:55 AM Beatriz Chancellor, CPhT

## 2019-10-10 LAB — IGE: IgE (Immunoglobulin E), Serum: 25 kU/L (ref <=114)

## 2019-10-15 LAB — HYPERSENSITIVITY PNEUMONITIS
A. Pullulans Abs: NEGATIVE
A.Fumigatus #1 Abs: NEGATIVE
Micropolyspora faeni, IgG: NEGATIVE
Pigeon Serum Abs: NEGATIVE
Thermoact. Saccharii: NEGATIVE
Thermoactinomyces vulgaris, IgG: NEGATIVE

## 2019-10-24 ENCOUNTER — Ambulatory Visit (HOSPITAL_BASED_OUTPATIENT_CLINIC_OR_DEPARTMENT_OTHER)
Admission: RE | Admit: 2019-10-24 | Discharge: 2019-10-24 | Disposition: A | Discharge status: Home / Self Care | Payer: Medicare PPO | Source: Ambulatory Visit | Attending: Pulmonary Disease | Admitting: Pulmonary Disease

## 2019-10-24 ENCOUNTER — Other Ambulatory Visit: Payer: Self-pay

## 2019-10-24 DIAGNOSIS — R0602 Shortness of breath: Secondary | ICD-10-CM | POA: Diagnosis not present

## 2019-12-03 ENCOUNTER — Other Ambulatory Visit: Payer: Self-pay

## 2019-12-03 ENCOUNTER — Encounter: Payer: Self-pay | Admitting: Pulmonary Disease

## 2019-12-03 ENCOUNTER — Ambulatory Visit: Payer: Medicare PPO | Admitting: Pulmonary Disease

## 2019-12-03 VITALS — BP 120/78 | HR 87 | Temp 97.6°F | Ht 62.5 in | Wt 198.8 lb

## 2019-12-03 DIAGNOSIS — R05 Cough: Secondary | ICD-10-CM

## 2019-12-03 DIAGNOSIS — J441 Chronic obstructive pulmonary disease with (acute) exacerbation: Secondary | ICD-10-CM

## 2019-12-03 DIAGNOSIS — R059 Cough, unspecified: Secondary | ICD-10-CM

## 2019-12-03 NOTE — Progress Notes (Signed)
Carrie White    [id number]    Jun 26, 1941  Primary Care Physician:Moreira, Carleene Overlie, MD  Referring Physician: Thornton Dales I, MD [place],   [id number]  Chief complaint: Follow up for asthma  HPI: 79 year old with history of allergies, hypertension, asthma, diabetes Had an admission in January 2020 for acute respiratory failure which is thought to be due to asthma/COPD exacerbation.  Readmitted in January 2021 with respiratory failure, viral bronchitis, SARS-CoV-2 and respiratory virus panel, flu were -negative  Post discharge she continues to have significant congestion, cough, throat irritation.  She is currently on Qvar and duo nebs.  She also has GERD for which she is taking Prilosec once daily  Pets: No pets or bird exposure.  Occupation:  Exposures: No mold, hot tub, Jacuzzi.  Has down comforter since 2016 Smoking history: Smoked socially, quit in 1970 Travel history: No significant travel history Relevant family history: No significant family history of lung disease  Interim History: States that breathing and cough have improved after lisinopril was held and she was given Prilosec 40 mg twice daily for 2 weeks Continues on Norvasc with good control of blood pressure  She is using the Symbicort as needed.  Outpatient Encounter Medications as of 12/03/2019  Medication Sig  . ACCU-CHEK SOFTCLIX LANCETS lancets Use to test blood sugar 2 times daily as instructed. Dx code: 250.61  . albuterol (VENTOLIN HFA) 108 (90 Base) MCG/ACT inhaler Inhale 1-2 puffs into the lungs every 6 (six) hours as needed for wheezing or shortness of breath.  Marland Kitchen amLODipine (NORVASC) 5 MG tablet Take 1 tablet (5 mg total) by mouth daily.  Marland Kitchen aspirin EC 81 MG EC tablet Take 1 tablet (81 mg total) by mouth daily.  . BD ULTRA-FINE PEN NEEDLES 29G X 12.7MM MISC USE DAILY  . benzonatate (TESSALON) 200 MG capsule Take 1 capsule (200 mg total) by mouth 3 (three) times daily as  needed for cough.  . Blood Glucose Monitoring Suppl (ACCU-CHEK AVIVA PLUS) W/DEVICE KIT Use to check blood sugar daily. Dx code: 250.61  . budesonide-formoterol (SYMBICORT) 160-4.5 MCG/ACT inhaler Inhale 2 puffs into the lungs 2 (two) times daily.  . DULoxetine (CYMBALTA) 30 MG capsule Take 30 mg by mouth daily.  . ferrous sulfate 325 (65 FE) MG tablet Take 325 mg by mouth daily with breakfast.  . furosemide (LASIX) 20 MG tablet Take 40 mg by mouth daily.  Marland Kitchen gabapentin (NEURONTIN) 300 MG capsule Take 300 mg by mouth at bedtime.  Marland Kitchen glimepiride (AMARYL) 4 MG tablet Take 4 mg by mouth 2 (two) times daily.  Marland Kitchen glucose blood (ACCU-CHEK AVIVA PLUS) test strip Use to test blood sugar 2 times daily as instructed. Dx code: 250.61  . Glucose Blood (BAYER BREEZE 2 TEST) DISK TEST 2 TIMES PER DAY  . Insulin Lispro Prot & Lispro (HUMALOG MIX 75/25 KWIKPEN) (75-25) 100 UNIT/ML Kwikpen Inject 50 Units into the skin 2 (two) times daily.  Marland Kitchen ipratropium-albuterol (DUONEB) 0.5-2.5 (3) MG/3ML SOLN Take 3 mLs by nebulization every 6 (six) hours. (Patient taking differently: Take 3 mLs by nebulization every 6 (six) hours as needed (shortness of breath). )  . lisinopril (ZESTRIL) 10 MG tablet Take 10 mg by mouth daily.  . meloxicam (MOBIC) 7.5 MG tablet Take 7.5 mg by mouth daily.  . metFORMIN (GLUCOPHAGE-XR) 500 MG 24 hr tablet Take 1,000 mg by mouth at bedtime.  . montelukast (SINGULAIR) 10 MG tablet Take 10 mg  by mouth at bedtime.  Marland Kitchen omeprazole (PRILOSEC) 40 MG capsule Take 1 capsule (40 mg total) by mouth daily. PT NEEDS NEW PCP FOR FURTHER REFILLS DR [name] RETIRED (Patient taking differently: Take 40 mg by mouth daily. )  . rOPINIRole (REQUIP) 4 MG tablet Take 4 mg by mouth at bedtime.   . simvastatin (ZOCOR) 40 MG tablet Take 1 tablet (40 mg total) by mouth at bedtime. PT NEEDS NEW PCP FOR FURTHER REFILLS DR [name] RETIRED (Patient taking differently: Take 40 mg by mouth at bedtime. )  . traZODone (DESYREL) 100 MG  tablet Take 100-200 mg by mouth at bedtime as needed.   . TRULICITY 1.5 FV/4.9SW SOPN Inject 1.5 mg into the skin every Sunday.   No facility-administered encounter medications on file as of 12/03/2019.   Physical Exam: Blood pressure 120/78, pulse 87, temperature 97.6 F (36.4 C), temperature source Temporal, height 5' 2.5" (1.588 m), weight 198 lb 12.8 oz (90.2 kg), SpO2 97 %. Gen:      No acute distress HEENT:  EOMI, sclera anicteric Neck:     No masses; no thyromegaly Lungs:    Clear to auscultation bilaterally; normal respiratory effort CV:         Regular rate and rhythm; no murmurs Abd:      + bowel sounds; soft, non-tender; no palpable masses, no distension Ext:    No edema; adequate peripheral perfusion Skin:      Warm and dry; no rash Neuro: alert and oriented x 3 Psych: normal mood and affect  Data Reviewed: Imaging: CTA 08/24/2018-no PE, mild dependent atelectasis.  Coronary atherosclerosis Chest x-ray 09/04/2019-mild cardiomegaly, no active cardiopulmonary disease. High-resolution CT 10/24/2019-no interstitial lung disease, mild patchy air trapping, dominant 1.6 cm posterior thyroid nodule. I have reviewed the images personally.  PFTs: Spirometry 09/19/2018 FVC 2 [76], FEV1 1.6 [79%], F/F 78 Normal test  Labs: CBC 09/03/2019-WBC 12.7, eos 1%, absolute eosinophil count 127 CBC 10/09/2019-WBC 11.4, eos 0.9%, absolute eosinophil count 103   IgE 09/19/2018-17 IgE 10/08/2018 1-25  BNP 10/08/2018 1-11 Hypersensitivity panel 10/09/2019-negative  Cardiac: Echo 08/25/2018-LVEF 96-75%, grade 1 diastolic dysfunction, RVSP 26, PA systolic pressure within normal range  Assessment:  Respiratory failure, asthma ACE inhibitor induced cough Has a diagnosis of asthma but no obstruction on spirometry in the past Suspect she has a combination of GERD, ACE inhibitor induced cough. History is notable for down exposure for the past 4 years but HRCT and hypersensitivity panel with no  evidence of pneumonitis.  Continues on Symbicort which she is using as needed We will review PFTs when available Stay off lisinopril  Thyroid nodule Incidentally noted on CT chest.  We had send the results to the patient and recommended follow-up with PCP but patient was not aware of this recommendation Discussed again with patient and daughter today and offered to order the ultrasound They prefer to follow-up with primary care regarding this.  GERD Continue Prilosec  Plan/Recommendations: Continue Symbicort, Prilosec and lisinopril Follow PFTs Discussed thyroid ultrasound with primary care   Marshell Garfinkel MD [facility] 12/03/2019, 1:47 PM  CC: Lauraine Rinne, MD

## 2019-12-03 NOTE — Patient Instructions (Addendum)
Glad you are doing well with regard to your breathing Continue the Symbicort as needed We will review PFTs when available  Your CT scan shows 1.6 cm nodule in the thyroid that will need further follow-up with ultrasound.  We have discussed this in the office and he had indicated that he will follow-up with your primary care regarding this.  Please make sure that follow-up ultrasound is ordered  Follow-up in pulmonary clinic in 3 months.

## 2019-12-16 ENCOUNTER — Other Ambulatory Visit (HOSPITAL_COMMUNITY)
Admission: RE | Admit: 2019-12-16 | Discharge: 2019-12-16 | Disposition: A | Discharge status: Home / Self Care | Payer: Medicare PPO | Source: Ambulatory Visit | Attending: Pulmonary Disease | Admitting: Pulmonary Disease

## 2019-12-16 DIAGNOSIS — Z20822 Contact with and (suspected) exposure to covid-19: Secondary | ICD-10-CM | POA: Diagnosis not present

## 2019-12-16 DIAGNOSIS — Z01812 Encounter for preprocedural laboratory examination: Secondary | ICD-10-CM | POA: Diagnosis present

## 2019-12-16 LAB — SARS CORONAVIRUS 2 (TAT 6-24 HRS): SARS Coronavirus 2: NEGATIVE

## 2019-12-19 ENCOUNTER — Ambulatory Visit (INDEPENDENT_AMBULATORY_CARE_PROVIDER_SITE_OTHER): Payer: Medicare PPO | Admitting: Pulmonary Disease

## 2019-12-19 ENCOUNTER — Other Ambulatory Visit: Payer: Self-pay

## 2019-12-19 DIAGNOSIS — R0602 Shortness of breath: Secondary | ICD-10-CM

## 2019-12-19 LAB — PULMONARY FUNCTION TEST
DL/VA % pred: 114 %
DL/VA: 4.74 ml/min/mmHg/L
DLCO cor % pred: 105 %
DLCO cor: 18.98 ml/min/mmHg
DLCO unc % pred: 105 %
DLCO unc: 18.98 ml/min/mmHg
FEF 25-75 Post: 1.92 L/sec
FEF 25-75 Pre: 1.41 L/sec
FEF2575-%Change-Post: 36 %
FEF2575-%Pred-Post: 134 %
FEF2575-%Pred-Pre: 98 %
FEV1-%Change-Post: 14 %
FEV1-%Pred-Post: 106 %
FEV1-%Pred-Pre: 92 %
FEV1-Post: 1.98 L
FEV1-Pre: 1.73 L
FEV1FVC-%Change-Post: 6 %
FEV1FVC-%Pred-Pre: 106 %
FEV6-%Change-Post: 7 %
FEV6-%Pred-Post: 99 %
FEV6-%Pred-Pre: 92 %
FEV6-Post: 2.37 L
FEV6-Pre: 2.19 L
FEV6FVC-%Pred-Post: 105 %
FEV6FVC-%Pred-Pre: 105 %
FVC-%Change-Post: 7 %
FVC-%Pred-Post: 94 %
FVC-%Pred-Pre: 87 %
FVC-Post: 2.37 L
FVC-Pre: 2.19 L
Post FEV1/FVC ratio: 84 %
Post FEV6/FVC ratio: 100 %
Pre FEV1/FVC ratio: 79 %
Pre FEV6/FVC Ratio: 100 %
RV % pred: 47 %
RV: 1.07 L
TLC % pred: 94 %
TLC: 4.57 L

## 2019-12-19 NOTE — Progress Notes (Signed)
Full PFT performed today. °

## 2020-02-28 ENCOUNTER — Other Ambulatory Visit: Payer: Self-pay | Admitting: Pulmonary Disease

## 2020-02-28 DIAGNOSIS — R0602 Shortness of breath: Secondary | ICD-10-CM

## 2020-04-06 ENCOUNTER — Other Ambulatory Visit: Payer: Self-pay | Admitting: Pulmonary Disease

## 2020-04-06 DIAGNOSIS — R0602 Shortness of breath: Secondary | ICD-10-CM

## 2020-04-13 IMAGING — DX DG CHEST 1V PORT
1 series · 1 of 1 positions shown · non-contrast
Comparison: 09/19/2018

CLINICAL DATA: Cough, sore throat

EXAM:
PORTABLE CHEST 1 VIEW

[chest ap]
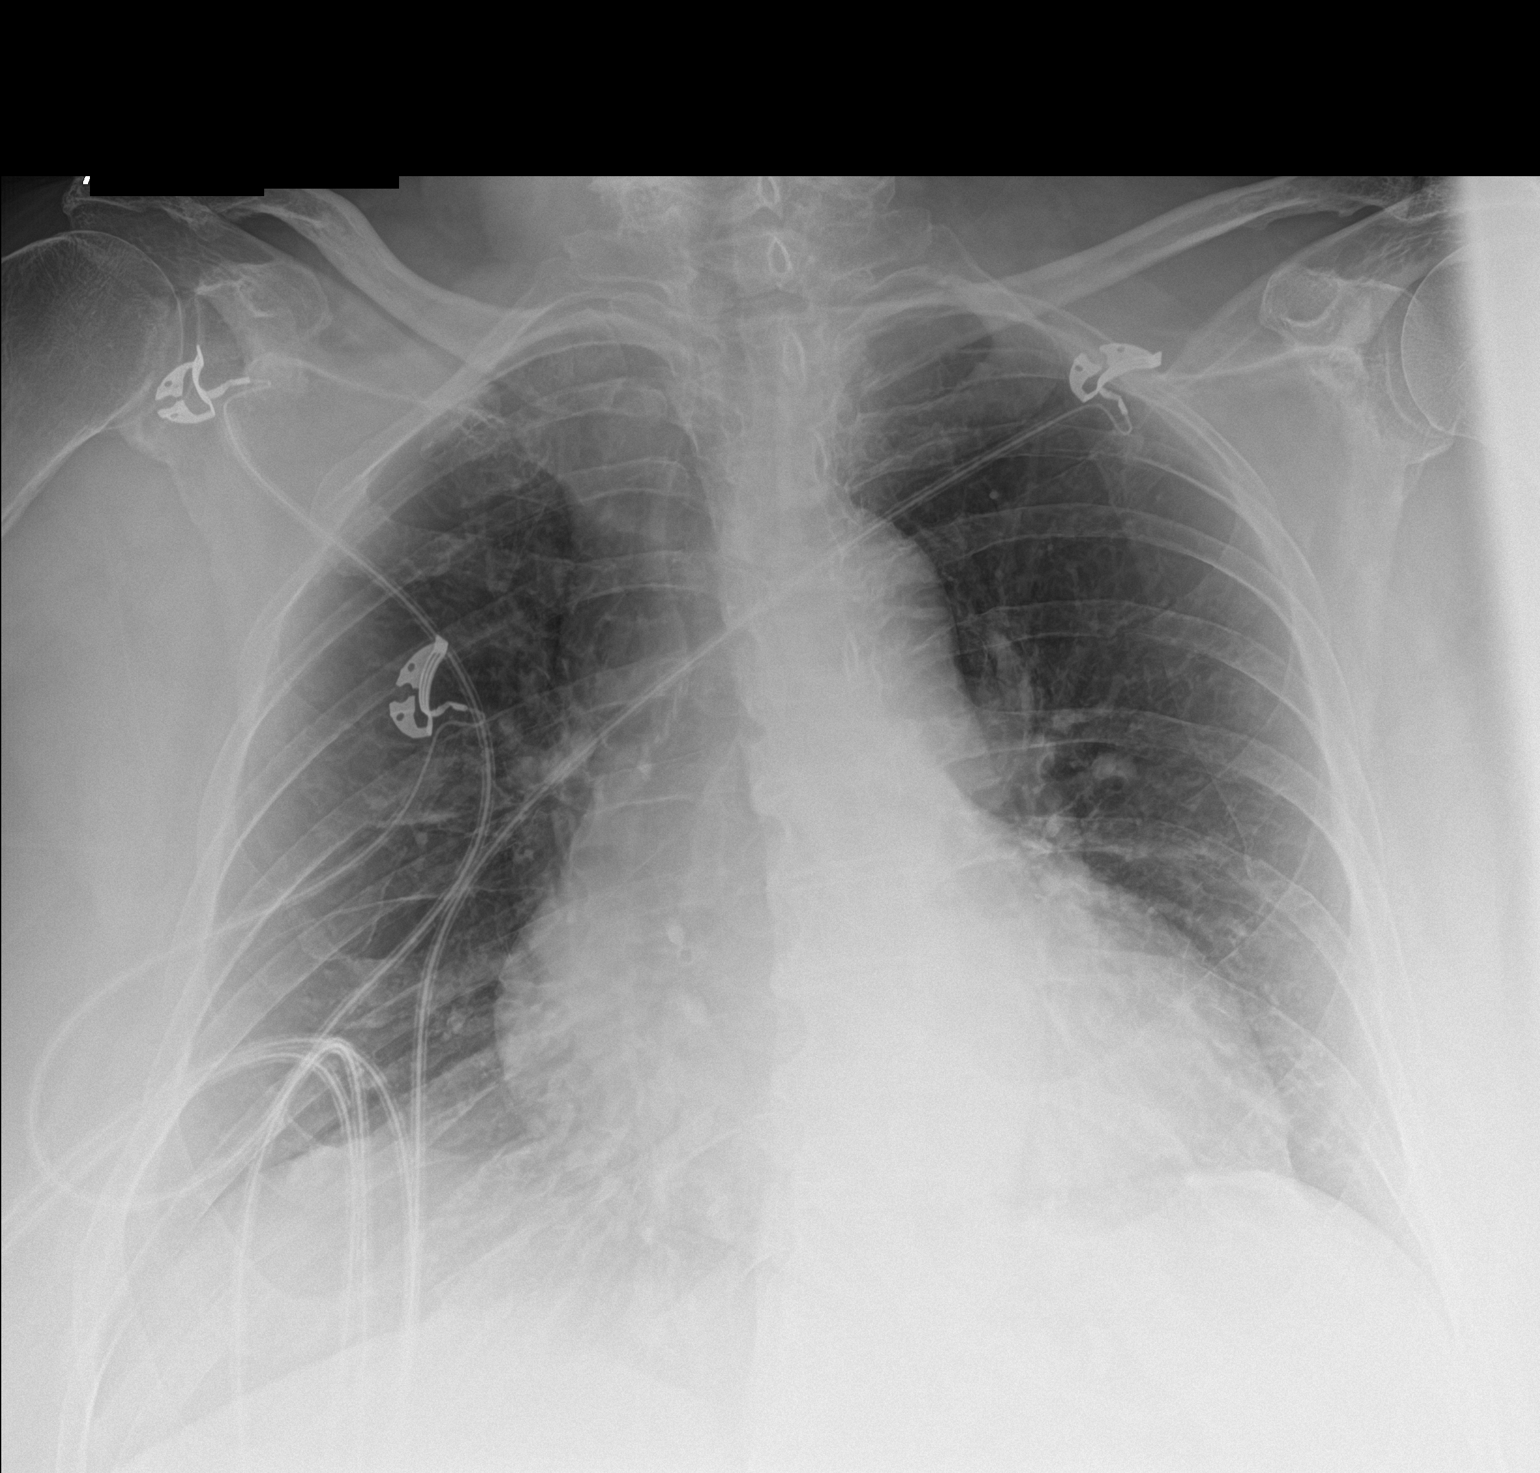

[1 of 1 positions shown; findings below may reference images not displayed]

FINDINGS: No new consolidation or edema. Stable cardiomediastinal contours
with mild cardiomegaly. No pleural effusion or pneumothorax.
IMPRESSION: No acute process in the chest.

## 2020-04-14 IMAGING — DX DG CHEST 2V
3 series · 3 of 3 positions shown · non-contrast
Comparison: Chest radiograph from one day prior.

CLINICAL DATA: Cough, dyspnea, sore throat

EXAM:
CHEST - 2 VIEW

[chest lat (1 of 2)]
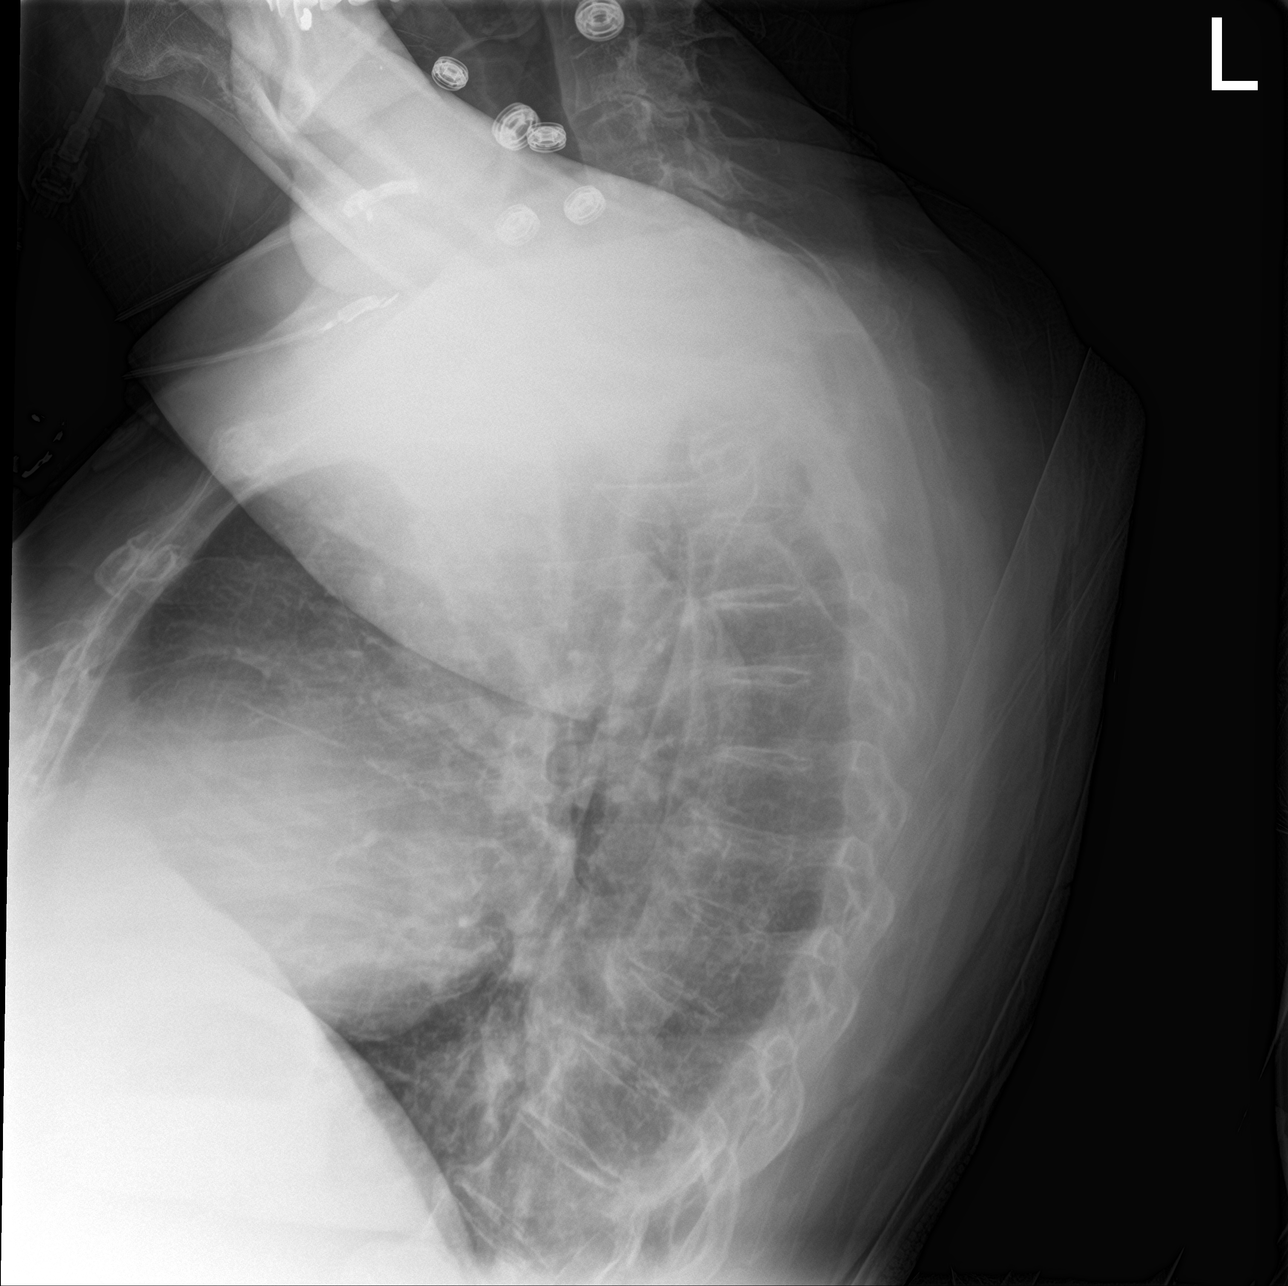

[chest ap]
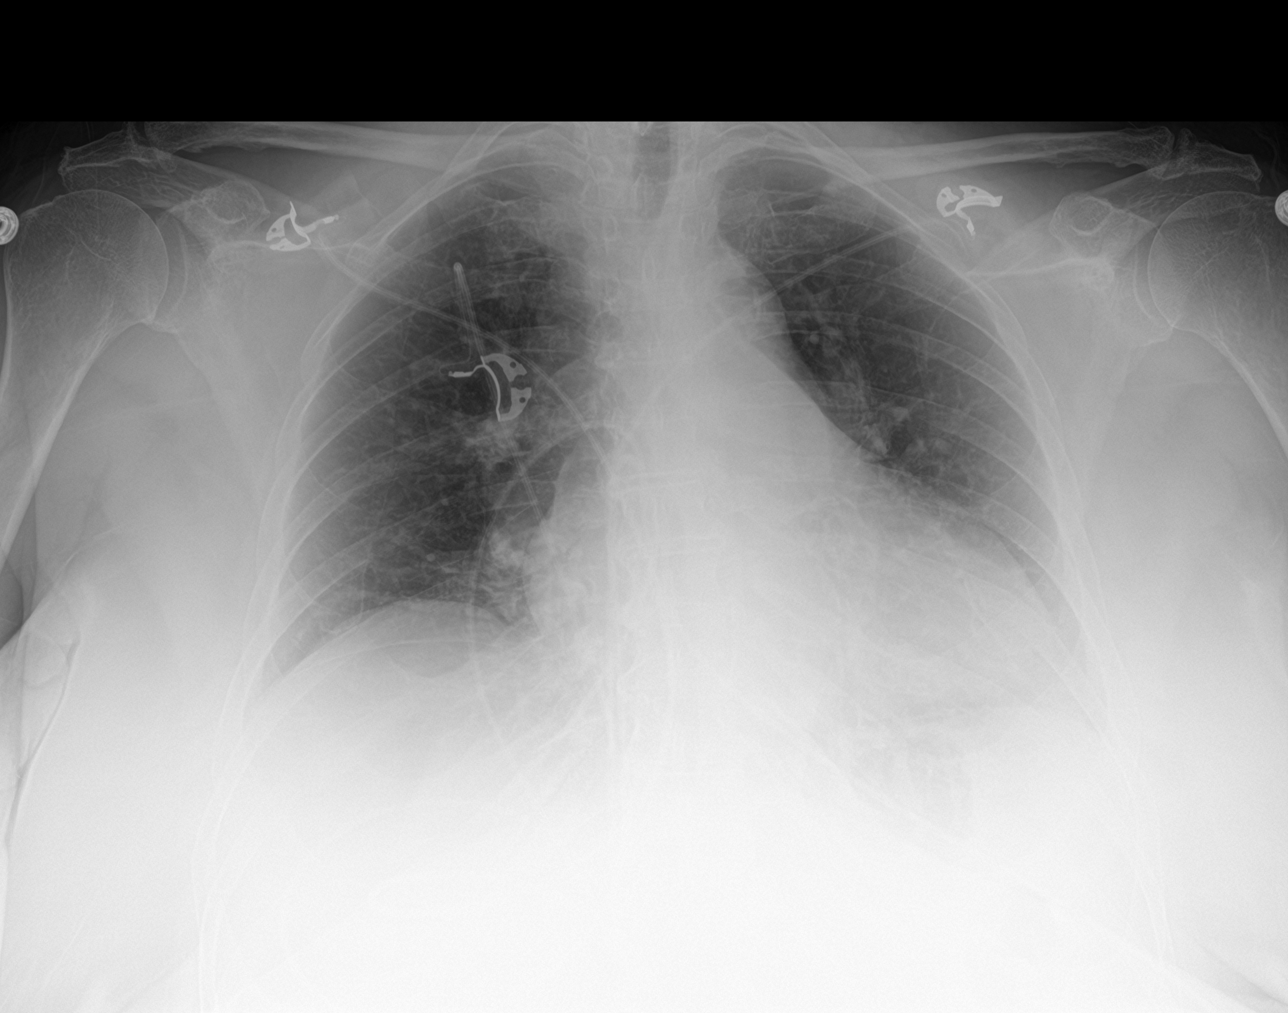

[chest lat (2 of 2)]
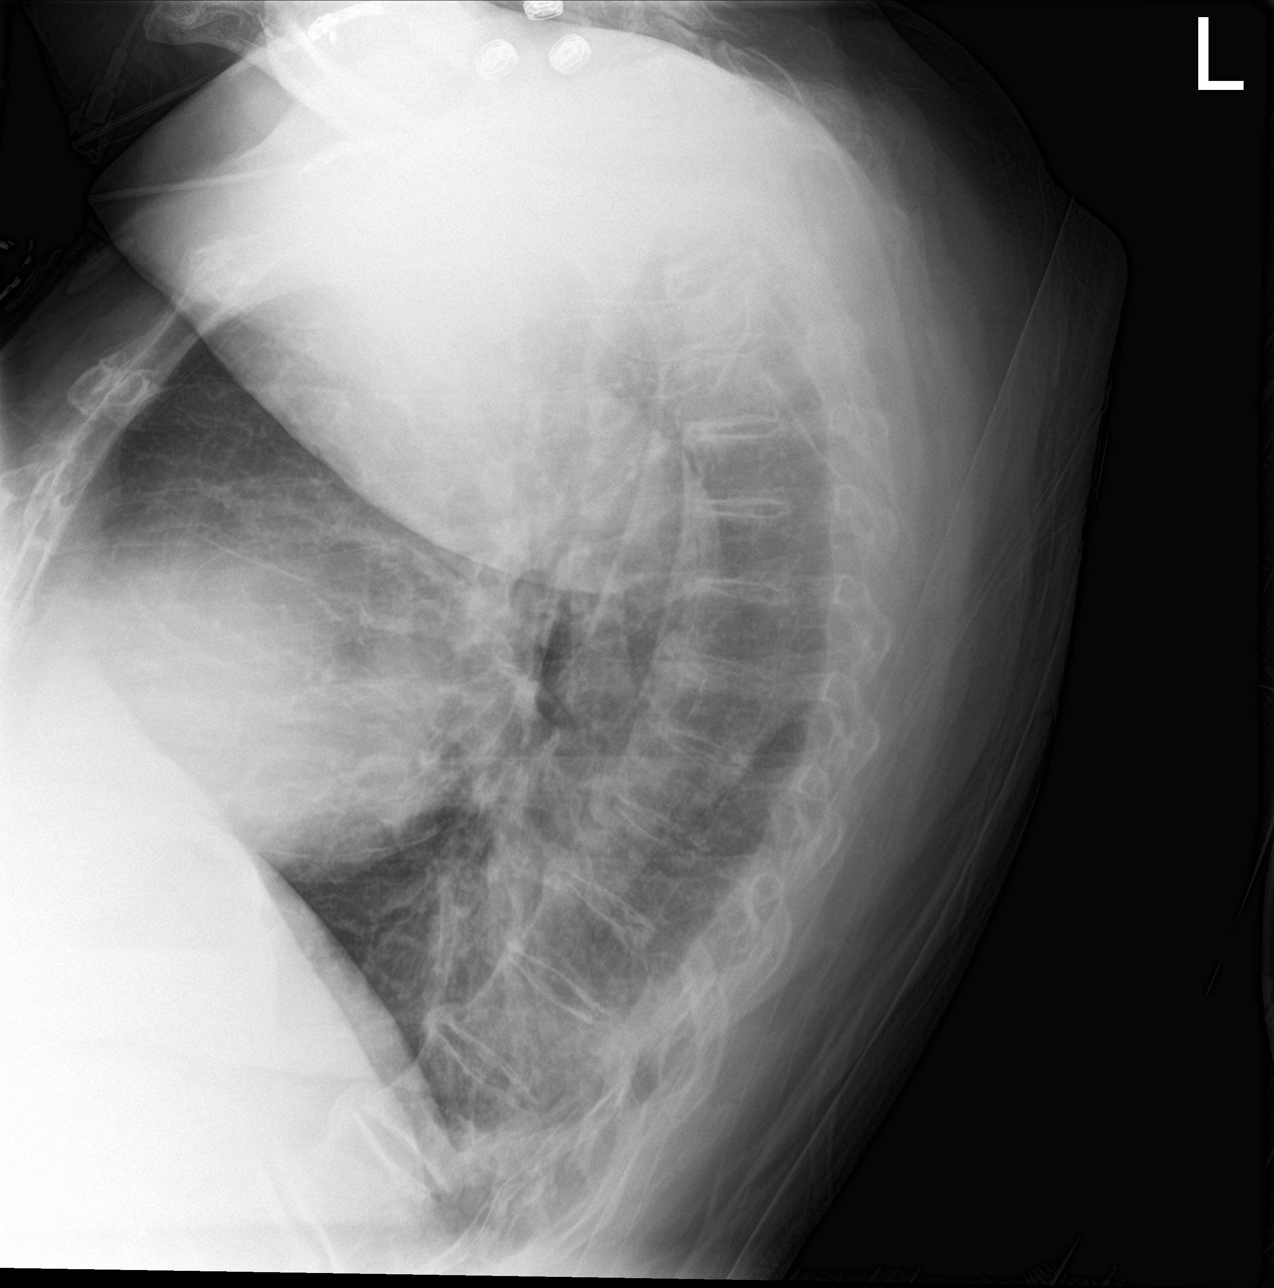

[3 of 3 positions shown; findings below may reference images not displayed]

FINDINGS: Stable cardiomediastinal silhouette with mild cardiomegaly. No
pneumothorax. No pleural effusion. Lungs appear clear, with no acute
consolidative airspace disease and no pulmonary edema.
IMPRESSION: Stable mild cardiomegaly without pulmonary edema. No active
pulmonary disease.

## 2020-09-15 ENCOUNTER — Other Ambulatory Visit: Payer: Self-pay | Admitting: Pulmonary Disease

## 2020-09-15 DIAGNOSIS — R0602 Shortness of breath: Secondary | ICD-10-CM

## 2022-01-31 ENCOUNTER — Telehealth: Payer: Self-pay | Admitting: Pulmonary Disease

## 2022-01-31 NOTE — Telephone Encounter (Signed)
Called patient but she did not answer. Left message for her to call back.  

## 2022-02-02 ENCOUNTER — Encounter: Payer: Self-pay | Admitting: Pulmonary Disease

## 2022-02-02 ENCOUNTER — Ambulatory Visit (INDEPENDENT_AMBULATORY_CARE_PROVIDER_SITE_OTHER): Payer: Medicare PPO

## 2022-02-02 ENCOUNTER — Ambulatory Visit: Payer: Medicare PPO | Admitting: Pulmonary Disease

## 2022-02-02 VITALS — BP 144/72 | HR 105 | Temp 98.0°F | Ht 62.0 in | Wt 207.4 lb

## 2022-02-02 DIAGNOSIS — R059 Cough, unspecified: Secondary | ICD-10-CM

## 2022-02-02 DIAGNOSIS — J9600 Acute respiratory failure, unspecified whether with hypoxia or hypercapnia: Secondary | ICD-10-CM | POA: Diagnosis not present

## 2022-02-02 LAB — CBC WITH DIFFERENTIAL/PLATELET
Basophils Absolute: 0 10*3/uL (ref 0.0–0.1)
Basophils Relative: 0.4 % (ref 0.0–3.0)
Eosinophils Absolute: 0.4 10*3/uL (ref 0.0–0.7)
Eosinophils Relative: 3.7 % (ref 0.0–5.0)
HCT: 43.1 % (ref 36.0–46.0)
Hemoglobin: 14.7 g/dL (ref 12.0–15.0)
Lymphocytes Relative: 33.6 % (ref 12.0–46.0)
Lymphs Abs: 3.2 10*3/uL (ref 0.7–4.0)
MCHC: 34.2 g/dL (ref 30.0–36.0)
MCV: 89.8 fl (ref 78.0–100.0)
Monocytes Absolute: 0.7 10*3/uL (ref 0.1–1.0)
Monocytes Relative: 7 % (ref 3.0–12.0)
Neutro Abs: 5.3 10*3/uL (ref 1.4–7.7)
Neutrophils Relative %: 55.3 % (ref 43.0–77.0)
Platelets: 270 10*3/uL (ref 150.0–400.0)
RBC: 4.8 Mil/uL (ref 3.87–5.11)
RDW: 13.8 % (ref 11.5–15.5)
WBC: 9.5 10*3/uL (ref 4.0–10.5)

## 2022-02-02 MED ORDER — METHYLPREDNISOLONE ACETATE 80 MG/ML IJ SUSP
120.0000 mg | Freq: Once | INTRAMUSCULAR | Status: AC
  Administered 2022-02-02: 120 mg via INTRAMUSCULAR

## 2022-02-02 MED ORDER — PREDNISONE 20 MG PO TABS: ORAL_TABLET | ORAL | 0 refills | Status: DC

## 2022-02-02 MED ORDER — OMEPRAZOLE 40 MG PO CPDR: 40.0000 mg | DELAYED_RELEASE_CAPSULE | Freq: Two times a day (BID) | ORAL | 0 refills | Status: DC

## 2022-02-02 MED ORDER — BUDESONIDE-FORMOTEROL FUMARATE 160-4.5 MCG/ACT IN AERO: 2.0000 | INHALATION_SPRAY | Freq: Two times a day (BID) | RESPIRATORY_TRACT | 6 refills | Status: DC

## 2022-02-02 MED ORDER — AZITHROMYCIN 250 MG PO TABS: ORAL_TABLET | ORAL | 0 refills | Status: DC

## 2022-02-02 NOTE — Patient Instructions (Signed)
We will get a chest x-ray today CBC with differential, IgE Start Z-Pak, prednisone 40 mg a day for 5 days Depo injection today Restart Symbicort 160/4.5 Increase omeprazole to 40 mg twice daily for 2 weeks.  We will send in a new prescription for this Follow-up in 1 to 2 months.

## 2022-02-02 NOTE — Progress Notes (Signed)
Carrie White    [id number]    Jun 24, 1941  Primary Care Physician:Moreira, Carleene Overlie, MD  Referring Physician: Thornton Dales I, MD [place],  [place] [address]  Chief complaint: Follow up for asthma  HPI: 81 y.o. with history of allergies, hypertension, asthma, diabetes Had an admission in January 2020 for acute respiratory failure which is thought to be due to asthma/COPD exacerbation.  Readmitted in January 2021 with respiratory failure, viral bronchitis, SARS-CoV-2 and respiratory virus panel, flu were -negative  Post discharge she continues to have significant congestion, cough, throat irritation.  She is currently on Qvar and duo nebs.  She also has GERD for which she is taking Prilosec once daily  States that breathing and cough have improved in 2021 after lisinopril was held and she was given Prilosec 40 mg twice daily for 2 weeks  Pets: No pets or bird exposure.  Occupation:  Exposures: No mold, hot tub, Jacuzzi.  Has down comforter since 2016 Smoking history: Smoked socially, quit in 1970 Travel history: No significant travel history Relevant family history: No significant family history of lung disease  Interim History: She is here for a follow-up visit after an interval of 2 years.  She had been doing well with no cough, off inhalers Had an acute worsening a week ago associated with a viral-like minutes.  She had mild fevers at home, cough, wheezing and congestion  Outpatient Encounter Medications as of 02/02/2022  Medication Sig   ACCU-CHEK SOFTCLIX LANCETS lancets Use to test blood sugar 2 times daily as instructed. Dx code: 250.61   acetaminophen-codeine (TYLENOL #3) 300-30 MG tablet Take by mouth.   alendronate (FOSAMAX) 70 MG tablet Take 70 mg by mouth once a week.   amLODipine (NORVASC) 5 MG tablet TAKE 1 TABLET BY MOUTH EVERY DAY   BD ULTRA-FINE PEN NEEDLES 29G X 12.7MM MISC USE DAILY   Blood Glucose Monitoring Suppl (ACCU-CHEK AVIVA  PLUS) W/DEVICE KIT Use to check blood sugar daily. Dx code: 250.61   DULoxetine (CYMBALTA) 30 MG capsule Take 30 mg by mouth daily.   ergocalciferol (VITAMIN D2) 1.25 MG (50000 UT) capsule TAKE 1 CAPSULE BY MOUTH ONE TIME PER WEEK   ferrous sulfate 325 (65 FE) MG tablet Take 325 mg by mouth daily with breakfast.   furosemide (LASIX) 20 MG tablet Take 40 mg by mouth daily.   gabapentin (NEURONTIN) 300 MG capsule Take 300 mg by mouth at bedtime.   glucose blood (ACCU-CHEK AVIVA PLUS) test strip Use to test blood sugar 2 times daily as instructed. Dx code: 250.61   Glucose Blood (BAYER BREEZE 2 TEST) DISK TEST 2 TIMES PER DAY   Insulin Lispro Prot & Lispro (HUMALOG MIX 75/25 KWIKPEN) (75-25) 100 UNIT/ML Kwikpen Inject 50 Units into the skin 2 (two) times daily.   ipratropium-albuterol (DUONEB) 0.5-2.5 (3) MG/3ML SOLN Take 3 mLs by nebulization every 6 (six) hours. (Patient taking differently: Take 3 mLs by nebulization every 6 (six) hours as needed (shortness of breath).)   omeprazole (PRILOSEC) 40 MG capsule Take 1 capsule (40 mg total) by mouth daily. PT NEEDS NEW PCP FOR FURTHER REFILLS DR [name] RETIRED (Patient taking differently: Take 40 mg by mouth daily.)   rOPINIRole (REQUIP) 4 MG tablet Take 4 mg by mouth at bedtime.    traZODone (DESYREL) 100 MG tablet Take 100-200 mg by mouth at bedtime as needed.    albuterol (VENTOLIN HFA) 108 (90 Base) MCG/ACT inhaler  Inhale 1-2 puffs into the lungs every 6 (six) hours as needed for wheezing or shortness of breath.   aspirin EC 81 MG EC tablet Take 1 tablet (81 mg total) by mouth daily.   benzonatate (TESSALON) 200 MG capsule Take 1 capsule (200 mg total) by mouth 3 (three) times daily as needed for cough.   glimepiride (AMARYL) 4 MG tablet Take 4 mg by mouth 2 (two) times daily.   meloxicam (MOBIC) 7.5 MG tablet Take 7.5 mg by mouth daily.   metFORMIN (GLUCOPHAGE-XR) 500 MG 24 hr tablet Take 1,000 mg by mouth at bedtime.   montelukast (SINGULAIR) 10  MG tablet Take 10 mg by mouth at bedtime.   simvastatin (ZOCOR) 40 MG tablet Take 1 tablet (40 mg total) by mouth at bedtime. PT NEEDS NEW PCP FOR FURTHER REFILLS DR [name] RETIRED (Patient taking differently: Take 40 mg by mouth at bedtime. )   SYMBICORT 160-4.5 MCG/ACT inhaler TAKE 2 PUFFS BY MOUTH TWICE A DAY   TRULICITY 1.5 WL/8.9HT SOPN Inject 1.5 mg into the skin every Sunday.   No facility-administered encounter medications on file as of 02/02/2022.   Physical Exam: Gen:      No acute distress HEENT:  EOMI, sclera anicteric Neck:     No masses; no thyromegaly Lungs:    Clear to auscultation bilaterally; normal respiratory effort CV:         Regular rate and rhythm; no murmurs Abd:      + bowel sounds; soft, non-tender; no palpable masses, no distension Ext:    No edema; adequate peripheral perfusion Skin:      Warm and dry; no rash Neuro: alert and oriented x 3 Psych: normal mood and affect   Data Reviewed: Imaging: CTA 08/24/2018-no PE, mild dependent atelectasis.  Coronary atherosclerosis Chest x-ray 09/04/2019-mild cardiomegaly, no active cardiopulmonary disease. High-resolution CT 10/24/2019-no interstitial lung disease, mild patchy air trapping, dominant 1.6 cm posterior thyroid nodule. I have reviewed the images personally.  PFTs: Spirometry 09/19/2018 FVC 2 [76], FEV1 1.6 [79%], F/F 78 Normal test  Labs: CBC 09/03/2019-WBC 12.7, eos 1%, absolute eosinophil count 127 CBC 10/09/2019-WBC 11.4, eos 0.9%, absolute eosinophil count 103   IgE 09/19/2018-17 IgE 10/08/2018 1-25  BNP 10/08/2018 1-11 Hypersensitivity panel 10/09/2019-negative  Cardiac: Echo 08/25/2018-LVEF 34-28%, grade 1 diastolic dysfunction, RVSP 26, PA systolic pressure within normal range  Assessment:  Respiratory failure, asthma ACE inhibitor induced cough Has a diagnosis of asthma but no obstruction on spirometry in the past Suspect she has a combination of GERD, ACE inhibitor induced cough. History is  notable for down exposure for the past 4 years but HRCT and hypersensitivity panel with no evidence of pneumonitis.  Now with acute exacerbation brought on by viral-like illness, acute bronchitis We will treat with Z-Pak, Depo injection and prednisone for 5 Resume Symbicort.  I am not sure if she will need to be on inhalers long-term as she had been doing so well in the years prior to this episode.  We will reassess at return visit.   Thyroid nodule Incidentally noted on CT chest.  Patient stated that they will follow-up with PCP regarding the  GERD Continue Prilosec  Plan/Recommendations: Resume Symbicort Chest x-ray, labs Steroids, Z-Pak  Marshell Garfinkel MD [facility] 02/02/2022, 1:41 PM  CC: Lauraine Rinne, MD

## 2022-02-03 LAB — IGE: IgE (Immunoglobulin E), Serum: 35 kU/L (ref <=114)

## 2022-02-09 NOTE — Telephone Encounter (Signed)
Pt was seen by Dr. Vaughan Browner 6/21. Nothing further needed.

## 2022-02-10 ENCOUNTER — Telehealth: Payer: Self-pay | Admitting: Pulmonary Disease

## 2022-02-10 MED ORDER — PREDNISONE 20 MG PO TABS: ORAL_TABLET | ORAL | 0 refills | Status: DC

## 2022-02-10 NOTE — Telephone Encounter (Signed)
Please tell her to stop the Symbicort You can try another round of prednisone 40 mg a day for 5 days Use Delsym and Mucinex over-the-counter

## 2022-02-10 NOTE — Telephone Encounter (Signed)
Called and spoke with pt letting her know the info per Dr. Vaughan Browner and she verbalized understanding. Rx for prednisone has been sent to preferred pharmacy. Nothing further needed.

## 2022-02-10 NOTE — Telephone Encounter (Signed)
Called and spoke with patient. She stated that she was seen by Dr. Vaughan Browner on 02/02/22 and was prescribed a zpak and prednisone. She finished all of her medications on Sunday 02/06/22. Since then the SOB and cough have continued. She described the cough has a dry cough. Denied any fever or body aches. She denied being around anyone who has been sick recently.   I asked her if she started the Symbicort but she stated that she does not have this. She would like for me to send this into the pharmacy for her. RX has been sent.   Dr. Vaughan Browner, can you please advise? Thanks!

## 2022-04-04 ENCOUNTER — Ambulatory Visit: Payer: Medicare PPO | Admitting: Pulmonary Disease

## 2022-05-02 ENCOUNTER — Other Ambulatory Visit: Payer: Self-pay | Admitting: Radiology

## 2022-06-10 ENCOUNTER — Other Ambulatory Visit: Payer: Self-pay | Admitting: Pulmonary Disease

## 2022-06-13 ENCOUNTER — Other Ambulatory Visit: Payer: Self-pay | Admitting: Pulmonary Disease

## 2022-07-09 ENCOUNTER — Telehealth: Payer: Self-pay | Admitting: Pulmonary Disease

## 2022-07-09 MED ORDER — ALBUTEROL SULFATE HFA 108 (90 BASE) MCG/ACT IN AERS: 2.0000 | INHALATION_SPRAY | RESPIRATORY_TRACT | 6 refills | Status: AC | PRN

## 2022-07-09 MED ORDER — PREDNISONE 10 MG PO TABS: 40.0000 mg | ORAL_TABLET | Freq: Every day | ORAL | 0 refills | Status: AC

## 2022-07-09 MED ORDER — BUDESONIDE-FORMOTEROL FUMARATE 160-4.5 MCG/ACT IN AERO: 2.0000 | INHALATION_SPRAY | Freq: Two times a day (BID) | RESPIRATORY_TRACT | 5 refills | Status: DC

## 2022-07-09 NOTE — Telephone Encounter (Signed)
She reports fever 101, chest congestion and cough. Some wheezing. Episode of diarrhea that has resolved. Has been using Dulera q6h.  Assessment URI, likely viral Asthma exacerbation --START Symbicort 160 TWO puffs TWICE daily --START Albuterol as needed for shortness of breath or wheezing --START prednisone 40 mg daily x 5 days Please contact office on Monday if symptoms not improved. If worsening shortness of breath before then, please go to the ED

## 2022-07-12 ENCOUNTER — Ambulatory Visit: Payer: Medicare PPO | Admitting: Pulmonary Disease

## 2022-07-12 ENCOUNTER — Encounter: Payer: Self-pay | Admitting: Pulmonary Disease

## 2022-07-12 ENCOUNTER — Ambulatory Visit (INDEPENDENT_AMBULATORY_CARE_PROVIDER_SITE_OTHER): Payer: Medicare PPO

## 2022-07-12 ENCOUNTER — Telehealth: Payer: Self-pay | Admitting: Pulmonary Disease

## 2022-07-12 VITALS — BP 122/72 | HR 92 | Temp 98.1°F | Ht 62.0 in | Wt 222.0 lb

## 2022-07-12 DIAGNOSIS — R059 Cough, unspecified: Secondary | ICD-10-CM

## 2022-07-12 DIAGNOSIS — R509 Fever, unspecified: Secondary | ICD-10-CM

## 2022-07-12 LAB — POC INFLUENZA A&B (BINAX/QUICKVUE)
Influenza A, POC: NEGATIVE
Influenza B, POC: NEGATIVE

## 2022-07-12 MED ORDER — PREDNISONE 20 MG PO TABS: ORAL_TABLET | ORAL | 0 refills | Status: DC

## 2022-07-12 MED ORDER — ALBUTEROL SULFATE (2.5 MG/3ML) 0.083% IN NEBU: 2.5000 mg | INHALATION_SOLUTION | Freq: Four times a day (QID) | RESPIRATORY_TRACT | 12 refills | Status: AC | PRN

## 2022-07-12 MED ORDER — AZITHROMYCIN 250 MG PO TABS: 250.0000 mg | ORAL_TABLET | Freq: Every day | ORAL | 0 refills | Status: DC

## 2022-07-12 NOTE — Telephone Encounter (Signed)
Called and spoke with pt about her symptoms since she is still not feeling any better after meds that were prescribed by Dr. Loanne Drilling 11/25. Asked pt if she had taken a covid test and she said that she did not and was not sure if she had any.  Pt then said that she thought she might have some at home. Stated to pt that she needed to take a covid test and then call us back with the results so we could then go from there. I did state to pt that she might need to go to urgent care to be evaluated.

## 2022-07-12 NOTE — Patient Instructions (Signed)
Prescription for azithromycin to go to pharmacy for you Prednisone 20 mg daily for 7 days  Flu test  Chest x-ray

## 2022-07-12 NOTE — Progress Notes (Signed)
Carrie White    [id number]    Feb 01, 1941  Primary Care Physician:Wang, Mikeal Hawthorne, MD  Referring Physician: Rudene Anda, [place] [address] [place],  [place] [address]  Chief complaint:   Patient being seen for fever cough shortness of breath  HPI:  Had recently called the office for symptoms Called in a course of prednisone, inhalers  Symptoms continue to progress with fevers of 102  Wheezing, sputum production  Recently had a diarrhea -Imodium did help  She has a history of hypertension, asthma, diabetes Admitted in 2020 and 2021 for respiratory failure  Has not multiple exacerbations over the months and recently just not feeling symptoms are improving  Reformed social smoker quit in 1970s  Outpatient Encounter Medications as of 07/12/2022  Medication Sig   ACCU-CHEK SOFTCLIX LANCETS lancets Use to test blood sugar 2 times daily as instructed. Dx code: 250.61   acetaminophen-codeine (TYLENOL #3) 300-30 MG tablet Take by mouth.   albuterol (VENTOLIN HFA) 108 (90 Base) MCG/ACT inhaler Inhale 2 puffs into the lungs every 4 (four) hours as needed for wheezing or shortness of breath.   alendronate (FOSAMAX) 70 MG tablet Take 70 mg by mouth once a week.   amLODipine (NORVASC) 5 MG tablet TAKE 1 TABLET BY MOUTH EVERY DAY   azithromycin (ZITHROMAX) 250 MG tablet Take 2 today, then 1 daily until gone   BD ULTRA-FINE PEN NEEDLES 29G X 12.7MM MISC USE DAILY   Blood Glucose Monitoring Suppl (ACCU-CHEK AVIVA PLUS) W/DEVICE KIT Use to check blood sugar daily. Dx code: 250.61   budesonide-formoterol (SYMBICORT) 160-4.5 MCG/ACT inhaler Inhale 2 puffs into the lungs in the morning and at bedtime.   DULoxetine (CYMBALTA) 30 MG capsule Take 30 mg by mouth daily.   ergocalciferol (VITAMIN D2) 1.25 MG (50000 UT) capsule TAKE 1 CAPSULE BY MOUTH ONE TIME PER WEEK   ferrous sulfate 325 (65 FE) MG tablet Take 325 mg by mouth daily with breakfast.   furosemide (LASIX) 20 MG tablet  Take 40 mg by mouth daily.   gabapentin (NEURONTIN) 300 MG capsule Take 300 mg by mouth at bedtime.   glucose blood (ACCU-CHEK AVIVA PLUS) test strip Use to test blood sugar 2 times daily as instructed. Dx code: 250.61   Glucose Blood (BAYER BREEZE 2 TEST) DISK TEST 2 TIMES PER DAY   Insulin Lispro Prot & Lispro (HUMALOG MIX 75/25 KWIKPEN) (75-25) 100 UNIT/ML Kwikpen Inject 50 Units into the skin 2 (two) times daily.   ipratropium-albuterol (DUONEB) 0.5-2.5 (3) MG/3ML SOLN Take 3 mLs by nebulization every 6 (six) hours. (Patient taking differently: Take 3 mLs by nebulization every 6 (six) hours as needed (shortness of breath).)   omeprazole (PRILOSEC) 40 MG capsule Take 1 capsule (40 mg total) by mouth daily. PT NEEDS NEW PCP FOR FURTHER REFILLS DR [name] RETIRED (Patient taking differently: Take 40 mg by mouth daily.)   predniSONE (DELTASONE) 10 MG tablet Take 4 tablets (40 mg total) by mouth daily with breakfast for 5 days.   rOPINIRole (REQUIP) 4 MG tablet Take 4 mg by mouth at bedtime.    traZODone (DESYREL) 100 MG tablet Take 100-200 mg by mouth at bedtime as needed.    montelukast (SINGULAIR) 10 MG tablet Take 10 mg by mouth at bedtime.   omeprazole (PRILOSEC) 40 MG capsule Take 1 capsule (40 mg total) by mouth in the morning and at bedtime for 5 days.   [DISCONTINUED] albuterol (VENTOLIN HFA) 108 (  90 Base) MCG/ACT inhaler Inhale 1-2 puffs into the lungs every 6 (six) hours as needed for wheezing or shortness of breath.   [DISCONTINUED] aspirin EC 81 MG EC tablet Take 1 tablet (81 mg total) by mouth daily.   [DISCONTINUED] benzonatate (TESSALON) 200 MG capsule Take 1 capsule (200 mg total) by mouth 3 (three) times daily as needed for cough.   [DISCONTINUED] glimepiride (AMARYL) 4 MG tablet Take 4 mg by mouth 2 (two) times daily.   [DISCONTINUED] meloxicam (MOBIC) 7.5 MG tablet Take 7.5 mg by mouth daily.   [DISCONTINUED] metFORMIN (GLUCOPHAGE-XR) 500 MG 24 hr tablet Take 1,000 mg by mouth at  bedtime.   [DISCONTINUED] simvastatin (ZOCOR) 40 MG tablet Take 1 tablet (40 mg total) by mouth at bedtime. PT NEEDS NEW PCP FOR FURTHER REFILLS DR [name] RETIRED (Patient taking differently: Take 40 mg by mouth at bedtime. )   [DISCONTINUED] TRULICITY 1.5 GO/1.1XB SOPN Inject 1.5 mg into the skin every Sunday.   No facility-administered encounter medications on file as of 07/12/2022.    Allergies as of 07/12/2022 - Review Complete 07/12/2022  Allergen Reaction Noted   Lisinopril Cough 10/15/2019    Past Medical History:  Diagnosis Date   Complication of anesthesia    Depression    Diabetes mellitus    type II- uncontrolled   FIBROMYALGIA 05/07/2007   Fibromyalgia    GERD (gastroesophageal reflux disease)    GOITER, MULTINODULAR 07/01/2010   HYPERLIPIDEMIA 07/31/2008   HYPERTENSION 08/01/2008   INSOMNIA, CHRONIC 06/29/2009   Intraductal papilloma of breast 07/12/2011   Lower respiratory tract infection 09/04/2019   OSTEOARTHRITIS 05/07/2007   PONV (postoperative nausea and vomiting)    RESTLESS LEG SYNDROME 05/07/2007    Past Surgical History:  Procedure Laterality Date   ABDOMINAL HYSTERECTOMY     BREAST MASS EXCISION  07/18/11   left breast   BREAST SURGERY     COLONOSCOPY     DILATION AND CURETTAGE OF UTERUS  1972   MASTECTOMY, PARTIAL  07/18/2011   MOUTH SURGERY  [id number]   OOPHORECTOMY     TOTAL KNEE ARTHROPLASTY Right 09/09/2013   DR ROWAN   TOTAL KNEE ARTHROPLASTY Left 09/09/2013   Procedure: TOTAL KNEE ARTHROPLASTY- left;  Surgeon: Kerin Salen, MD;  Location: [facility];  Service: Orthopedics;  Laterality: Left;    Family History  Problem Relation Age of Onset   Hypertension Mother    Stroke Mother        CVA   Cancer Father        Colon Cancer with Mets, Lung Cancer   Leukemia Sister    Diabetes Sister    Cancer Sister        Breast Cancer   Hypertension Brother    Stroke Brother        CVA   Cancer Brother        Prostate Cancer   Cancer Sister         Breast Cancer   Lupus Sister    Diabetes Brother    Thyroid disease Other        Brother (1974) thyroid surgery   Thyroid disease Other        Sister (1981) thyroid surgery    Social History   Socioeconomic History   Marital status: Widowed    Spouse name: Not on file   Number of children: Not on file   Years of education: Not on file   Highest education level: Not on file  Occupational History  Not on file  Tobacco Use   Smoking status: Former    Types: Cigarettes    Quit date: 10/28/1968    Years since quitting: 53.7   Smokeless tobacco: Never   Tobacco comments:    only smoke 1 pack per Month when she did smoke more socially 09/10/18  Vaping Use   Vaping Use: Never used  Substance and Sexual Activity   Alcohol use: No   Drug use: No   Sexual activity: Not Currently  Other Topics Concern   Not on file  Social History Narrative   [employer], [employer] p-secretarial course   Married 81'   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  Respiratory:  Positive for cough, shortness of breath and wheezing.     Vitals:   07/12/22 1523  BP: 122/72  Pulse: 92  Temp: 98.1 F (36.7 C)  SpO2: 99%     Physical Exam Constitutional:      Appearance: She is obese.  HENT:     Head: Normocephalic.     Nose: Nose normal.     Mouth/Throat:     Mouth: Mucous membranes are moist.  Eyes:     Conjunctiva/sclera: Conjunctivae normal.  Cardiovascular:     Rate and Rhythm: Normal rate and regular rhythm.     Heart sounds: No murmur heard.    No friction rub.  Pulmonary:     Effort: No respiratory distress.     Breath sounds: No stridor.  Wheezing and rhonchi present.  Musculoskeletal:     Cervical back: No rigidity or tenderness.  Neurological:     Mental Status: She is alert.  Psychiatric:        Mood and Affect: Mood normal.      Data Reviewed: Last chest x-ray 02/02/2022 shows no acute infiltrative process  Assessment:  Recurrent bronchitis  Shortness of breath  Wheezing  History of asthma with exacerbation  May have a viral infection ongoing-will test for the flu -She did receive the flu vaccine about a month ago  Plan/Recommendations: Flu testing  Chest x-ray today  Prescription for azithromycin  Prescription for prednisone 20 daily for 7 days  Encouraged to call with significant concerns   Sherrilyn Rist MD  Pulmonary and Critical Care 07/12/2022, 3:39 PM  CC: Rudene Anda, MD

## 2022-07-15 ENCOUNTER — Telehealth: Payer: Self-pay | Admitting: Pulmonary Disease

## 2022-07-15 ENCOUNTER — Other Ambulatory Visit: Payer: Self-pay | Admitting: Pulmonary Disease

## 2022-07-15 MED ORDER — AMOXICILLIN-POT CLAVULANATE 875-125 MG PO TABS: 1.0000 | ORAL_TABLET | Freq: Two times a day (BID) | ORAL | 0 refills | Status: AC

## 2022-07-15 MED ORDER — PREDNISONE 20 MG PO TABS: 20.0000 mg | ORAL_TABLET | Freq: Every day | ORAL | 0 refills | Status: DC

## 2022-07-15 NOTE — Telephone Encounter (Signed)
Pt called the office stating that Dr. Jenetta Downer told her to call the office back if she weren't any better after recent meds prescribed at [facility] 11/28.  Pt said that she has one day left of the azithromycin and has three days left of the prednisone.  Pt said she is still wheezing a lot, is weak, and is still coughing. States that her cough is a dry cough but it is a tight cough as when she coughs, she does hurt in her chest.  Pt said earlier today 12/1 when she took her temp, her temp was 100.0. states that she is taking tylenol which is not helping to keep her temp down.  Pt said she has been doing about three neb treatments a day, uses her Symbicort inhaler every day as prescribed and also has been using her rescue inhaler every 4 hours.  With what all is still going on, pt wants to know if more meds could be prescribed.  Dr. Jenetta Downer, please advise.

## 2022-07-15 NOTE — Telephone Encounter (Signed)
Called in a prescription for Augmentin-a different antibiotic, if she completes the azithromycin and still feeling poorly she can start the Augmentin  Prednisone 20 mg to be continued was called in as well

## 2022-07-15 NOTE — Progress Notes (Unsigned)
Called in prednisone and Augmentin

## 2022-07-15 NOTE — Telephone Encounter (Signed)
Called and spoke with pt letting her know recs per Dr. Jenetta Downer and she verbalized understanding. Nothing further needed.

## 2022-07-19 ENCOUNTER — Ambulatory Visit (INDEPENDENT_AMBULATORY_CARE_PROVIDER_SITE_OTHER): Payer: Medicare PPO

## 2022-07-19 ENCOUNTER — Ambulatory Visit: Payer: Medicare PPO | Admitting: Acute Care

## 2022-07-19 ENCOUNTER — Encounter: Payer: Self-pay | Admitting: Acute Care

## 2022-07-19 VITALS — BP 128/72 | HR 87 | Temp 98.0°F | Ht 62.0 in | Wt 225.0 lb

## 2022-07-19 DIAGNOSIS — R0602 Shortness of breath: Secondary | ICD-10-CM

## 2022-07-19 DIAGNOSIS — J069 Acute upper respiratory infection, unspecified: Secondary | ICD-10-CM

## 2022-07-19 DIAGNOSIS — R051 Acute cough: Secondary | ICD-10-CM | POA: Diagnosis not present

## 2022-07-19 MED ORDER — BENZONATATE 100 MG PO CAPS: 100.0000 mg | ORAL_CAPSULE | Freq: Two times a day (BID) | ORAL | 1 refills | Status: DC | PRN

## 2022-07-19 NOTE — Progress Notes (Signed)
History of Present Illness Carrie White is a 81 y.o. female former smoker ( remote history, quit 1970)with history of hypertension, asthma with frequent exacerbation,diabetes. Admitted in 2020 and 2021 for respiratory failure. She is followed by Dr. Isaiah Serge and Dr. Wynona Neat.    07/21/2022 Pt. Presents for follow up. She had fever, cough and shortness of breath and was seen as a acute visit by Dr. Wynona Neat on 07/12/2022. She states this started 07/09/2022. Dr. Wynona Neat evaluated her and he  initially treated her with a Azithromycin and a prednisone taper . She called the office 07/15/2022, stating she was no better and she would like additional antibiotic therapy. Dr. Wynona Neat called in Augmentin . She is still on the Augmentin and she finished the first prednisone taper and has another one that she is going to pick up today. This will be 20 mg daily . She is using an albuterol  nebulizer for her wheezing. She states this is helping, but it is short lived. She is very anxious today in the office. Gets to breathing very quickly, and is easily talked down.Her oxygen saturation is 96% on room air. She is also using her Symbicort 2 puffs twice daily. She is using the Albuterol inhaler every 4 hours, in addition to using her albuterol nebs 3-4 times a day. We discussed  that she is really over using her albuterol. I told her this can contribute to her anxiety.I told her she must treat the nebs and the inhaler as the same medication and any combination of the two needs to be used no more than 4 times a day. She verbalized understanding. Additionally, her blood sugars have been elevated with the prednisone. I have asked her to call her PCP to see if they feel she needs additional insulin while on the  prednisone.  Pt. Continues to complain a dry cough and an upper airway wheeze. The  She states her fever was 101.3 this morning at home . In the office today she was 98. She is using a forehead thermometer. It is a few  years old. I am unsure of its accuracy.  She has lower extremity edema that is worse than normal.  She is only taking her lasix once daily, it is prescribed BID. We will do a CXR  today to ensure this is not pulmonary edema.  Blood sugars are elevated on prednisone. I have asked the patient to follow up with her PCP.   Test Results: DG Chest 07/19/2022 Stable mild cardiac enlargement. Aortic atherosclerotic calcifications. No pleural effusion or edema. No airspace consolidation. Spondylosis noted within the thoracic spine   IMPRESSION: No acute cardiopulmonary abnormalities.  CXR results were called to the patient same day 07/19/2022. She verbalized understanding              Latest Ref Rng & Units 02/02/2022    2:00 PM 10/09/2019   12:10 PM 09/05/2019   10:29 AM  CBC  WBC 4.0 - 10.5 K/uL 9.5  11.4  8.7   Hemoglobin 12.0 - 15.0 g/dL 16.1  09.6  04.5   Hematocrit 36.0 - 46.0 % 43.1  44.5  35.0   Platelets 150.0 - 400.0 K/uL 270.0  306.0  203        Latest Ref Rng & Units 09/05/2019   10:29 AM 09/04/2019    3:19 AM 09/03/2019    1:45 PM  BMP  Glucose 70 - 99 mg/dL 409  811  914   BUN 8 - 23  mg/dL 13  8  9    Creatinine 0.44 - 1.00 mg/dL 1.61  0.96  0.45   Sodium 135 - 145 mmol/L 135  136  133   Potassium 3.5 - 5.1 mmol/L 3.4  3.3  4.1   Chloride 98 - 111 mmol/L 97  98  96   CO2 22 - 32 mmol/L 29  27  26    Calcium 8.9 - 10.3 mg/dL 9.0  8.7  8.9     BNP    Component Value Date/Time   BNP 22.2 08/24/2018 1931    ProBNP    Component Value Date/Time   PROBNP 11.0 10/09/2019 1210    PFT    Component Value Date/Time   FEV1PRE 1.73 12/19/2019 1348   FEV1POST 1.98 12/19/2019 1348   FVCPRE 2.19 12/19/2019 1348   FVCPOST 2.37 12/19/2019 1348   TLC 4.57 12/19/2019 1348   DLCOUNC 18.98 12/19/2019 1348   PREFEV1FVCRT 79 12/19/2019 1348   PSTFEV1FVCRT 84 12/19/2019 1348    DG Chest 2 View  Result Date: 07/19/2022 CLINICAL DATA:  Dyspnea. EXAM: CHEST - 2 VIEW  COMPARISON:  07/12/2022 FINDINGS: Stable mild cardiac enlargement. Aortic atherosclerotic calcifications. No pleural effusion or edema. No airspace consolidation. Spondylosis noted within the thoracic spine IMPRESSION: No acute cardiopulmonary abnormalities. Electronically Signed   By: Signa Kell M.D.   On: 07/19/2022 16:21   DG Chest 2 View  Result Date: 07/13/2022 CLINICAL DATA:  Recurrent bronchitis EXAM: CHEST - 2 VIEW COMPARISON:  March 04, 2022 FINDINGS: The heart size and mediastinal contours are stable. Both lungs are clear. The visualized skeletal structures are stable. Degenerative joint changes of the spine noted. IMPRESSION: No active cardiopulmonary disease. Electronically Signed   By: Sherian Rein M.D.   On: 07/13/2022 11:51     Past medical hx Past Medical History:  Diagnosis Date   Complication of anesthesia    Depression    Diabetes mellitus    type II- uncontrolled   FIBROMYALGIA 05/07/2007   Fibromyalgia    GERD (gastroesophageal reflux disease)    GOITER, MULTINODULAR 07/01/2010   HYPERLIPIDEMIA 07/31/2008   HYPERTENSION 08/01/2008   INSOMNIA, CHRONIC 06/29/2009   Intraductal papilloma of breast 07/12/2011   Lower respiratory tract infection 09/04/2019   OSTEOARTHRITIS 05/07/2007   PONV (postoperative nausea and vomiting)    RESTLESS LEG SYNDROME 05/07/2007     Social History   Tobacco Use   Smoking status: Former    Types: Cigarettes    Quit date: 10/28/1968    Years since quitting: 53.7   Smokeless tobacco: Never   Tobacco comments:    only smoke 1 pack per Month when she did smoke more socially 09/10/18  Vaping Use   Vaping Use: Never used  Substance Use Topics   Alcohol use: No   Drug use: No    Ms.Hairston reports that she quit smoking about 53 years ago. Her smoking use included cigarettes. She has never used smokeless tobacco. She reports that she does not drink alcohol and does not use drugs.  Tobacco Cessation: Former smoker quit 1971 , very  remote, only 1 pack per month  Past surgical hx, Family hx, Social hx all reviewed.  Current Outpatient Medications on File Prior to Visit  Medication Sig   ACCU-CHEK SOFTCLIX LANCETS lancets Use to test blood sugar 2 times daily as instructed. Dx code: 250.61   acetaminophen-codeine (TYLENOL #3) 300-30 MG tablet Take by mouth.   albuterol (PROVENTIL) (2.5 MG/3ML) 0.083% nebulizer solution Take 3 mLs (  2.5 mg total) by nebulization every 6 (six) hours as needed for wheezing or shortness of breath.   albuterol (VENTOLIN HFA) 108 (90 Base) MCG/ACT inhaler Inhale 2 puffs into the lungs every 4 (four) hours as needed for wheezing or shortness of breath.   alendronate (FOSAMAX) 70 MG tablet Take 70 mg by mouth once a week.   amLODipine (NORVASC) 5 MG tablet TAKE 1 TABLET BY MOUTH EVERY DAY   amoxicillin-clavulanate (AUGMENTIN) 875-125 MG tablet Take 1 tablet by mouth 2 (two) times daily for 10 days.   azithromycin (ZITHROMAX) 250 MG tablet Take 2 today, then 1 daily until gone   azithromycin (ZITHROMAX) 250 MG tablet Take 1 tablet (250 mg total) by mouth daily. Take two tablets on day one and then one daily for four days   BD ULTRA-FINE PEN NEEDLES 29G X 12.7MM MISC USE DAILY   Blood Glucose Monitoring Suppl (ACCU-CHEK AVIVA PLUS) W/DEVICE KIT Use to check blood sugar daily. Dx code: 250.61   budesonide-formoterol (SYMBICORT) 160-4.5 MCG/ACT inhaler Inhale 2 puffs into the lungs in the morning and at bedtime.   DULoxetine (CYMBALTA) 30 MG capsule Take 30 mg by mouth daily.   ergocalciferol (VITAMIN D2) 1.25 MG (50000 UT) capsule TAKE 1 CAPSULE BY MOUTH ONE TIME PER WEEK   ferrous sulfate 325 (65 FE) MG tablet Take 325 mg by mouth daily with breakfast.   furosemide (LASIX) 20 MG tablet Take 40 mg by mouth daily.   gabapentin (NEURONTIN) 300 MG capsule Take 300 mg by mouth at bedtime.   glucose blood (ACCU-CHEK AVIVA PLUS) test strip Use to test blood sugar 2 times daily as instructed. Dx code: 250.61    Glucose Blood (BAYER BREEZE 2 TEST) DISK TEST 2 TIMES PER DAY   Insulin Lispro Prot & Lispro (HUMALOG MIX 75/25 KWIKPEN) (75-25) 100 UNIT/ML Kwikpen Inject 50 Units into the skin 2 (two) times daily.   ipratropium-albuterol (DUONEB) 0.5-2.5 (3) MG/3ML SOLN Take 3 mLs by nebulization every 6 (six) hours. (Patient taking differently: Take 3 mLs by nebulization every 6 (six) hours as needed (shortness of breath).)   omeprazole (PRILOSEC) 40 MG capsule Take 1 capsule (40 mg total) by mouth daily. PT NEEDS NEW PCP FOR FURTHER REFILLS DR [name] RETIRED (Patient taking differently: Take 40 mg by mouth daily.)   predniSONE (DELTASONE) 20 MG tablet 20 mg daily for 7 days   predniSONE (DELTASONE) 20 MG tablet Take 1 tablet (20 mg total) by mouth daily with breakfast.   rOPINIRole (REQUIP) 4 MG tablet Take 4 mg by mouth at bedtime.    traZODone (DESYREL) 100 MG tablet Take 100-200 mg by mouth at bedtime as needed.    celecoxib (CELEBREX) 200 MG capsule Take 200 mg by mouth 2 (two) times daily.   montelukast (SINGULAIR) 10 MG tablet Take 10 mg by mouth at bedtime.   omeprazole (PRILOSEC) 40 MG capsule Take 1 capsule (40 mg total) by mouth in the morning and at bedtime for 5 days.   No current facility-administered medications on file prior to visit.     Allergies  Allergen Reactions   Lisinopril Cough    Review Of Systems:  Constitutional:   No  weight loss, night sweats,  +Fevers, chills, +fatigue, or  lassitude.  HEENT:   No headaches,  Difficulty swallowing,  Tooth/dental problems, or  Sore throat,                No sneezing, itching, ear ache,+ nasal congestion, +post nasal drip,  CV:  No chest pain,  Orthopnea, PND, swelling in lower extremities, anasarca, dizziness, palpitations, syncope.   GI  No heartburn, indigestion, abdominal pain, nausea, vomiting, diarrhea, change in bowel habits, loss of appetite, bloody stools.   Resp: + shortness of breath with exertion and  at rest.  No excess  mucus, + productive cough,  + non-productive cough,  No coughing up of blood.  No change in color of mucus.  + wheezing.  No chest wall deformity  Skin: no rash or lesions.  GU: no dysuria, change in color of urine, no urgency or frequency.  No flank pain, no hematuria   MS:  No joint pain or swelling.  No decreased range of motion.  No back pain.  Psych:  No change in mood or affect. No depression or anxiety.  No memory loss.   Vital Signs BP 128/72 (BP Location: Left Arm, Cuff Size: Normal)   Pulse 87   Temp 98 F (36.7 C) (Oral)   Ht 5\' 2"  (1.575 m)   Wt 225 lb (102.1 kg)   SpO2 96%   BMI 41.15 kg/m    Physical Exam:  General- No distress,  A&Ox3, very anxious ENT: No sinus tenderness, TM clear, pale nasal mucosa, no oral exudate,+ post nasal drip, no LAN Cardiac: S1, S2, regular rate and rhythm, no murmur Chest: + upper airway wheeze/ No rales/ dullness; no accessory muscle use, no nasal flaring, no sternal retractions, rapid breathing 2/2 anxiety, able to talk her down  Abd.: Soft Non-tender, ND, BS +, Body mass index is 41.15 kg/m.  Ext: No clubbing cyanosis, edema Neuro:  normal strength, states she feels weak and tired Skin: No rashes, warm and dry, no lesions  Psych: normal mood and behavior, very anxious   Assessment/Plan Slow to resolve viral illness started 07/09/2022 Treated with z pack, then Amoxicillin Prednisone taper x 2 Overuse of Albuterol , contributing to her anxiety Plan Start second prednisone prescription tomorrow that was sent in by Dr. Wynona Neat.  Remember albuterol is to be used up to 4 times daily. You have been double dosing on this.  Remember the inhaler and the nebulizer medications are the same.  In combination never use more than 4 times a day.  This is contributing to your anxiety Continue the Symbicort 2 puffs twice daily Rinse mouth after sleep. Use Delsym Sugar Free cough syrup 5 cc's in the morning and 5 cc's in the evening Sips  or water instead of throat clearing, as throat clearing can stimulate cough We will order a CT Chest without contrast to take a better look at your lungs.  You will get a call to get this scheduled.  Tessalon Perles 100 mg twice daily as needed for cough.  Please use at least once at bedtime. CXR today, stat read to RO pulmonary edema.  Please call your Primary care doctor about your elevated blood sugars.  They may need to increase your insulin while you are on the prednisone. Please buy a new digital thermometer.  Return in about 2 weeks (around 08/02/2022), or if symptoms worsen or fail to improve.     I spent 50 minutes dedicated to the care of this patient on the date of this encounter to include pre-visit review of records, face-to-face time with the patient discussing conditions above, post visit ordering of testing, clinical documentation with the electronic health record, making appropriate referrals as documented, and communicating necessary information to the patient's healthcare team.  Bevelyn Ngo, NP 07/21/2022  12:20 PM

## 2022-07-19 NOTE — Patient Instructions (Addendum)
Start second prednisone prescription tomorrow.  Remember albuterol is to be used up to 4 times daily. You have been double dosing on this.  Remember the inhaler and the nebulizer medications are the same.  Continue the Symbicort 2 puffs twice daily Rinse mouth after sleep. Use Delsym Sugar Free cough syrup 5 cc's in the morning and 5 cc's in the evening Sips or water instead of throat clearing, as throat clearing can stimulate cough We will order a CT Chest without contrast to take a better look at your lungs.  You will get a call to get this scheduled.  Tessalon Perles 100 mg twice daily as needed for cough.  Please use at least once at bedtime. CXR today, stat read to RO pulmonary edema.  Please call your Primary care doctor about your elevated blood sugars.  They may need to increase your insulin while you are on the prednisone. Please buy a new digital thermometer.  Return in about 2 weeks (around 08/02/2022), or if symptoms worsen or fail to improve.

## 2022-07-21 ENCOUNTER — Telehealth: Payer: Self-pay | Admitting: Acute Care

## 2022-07-21 ENCOUNTER — Encounter: Payer: Self-pay | Admitting: Acute Care

## 2022-07-21 NOTE — Telephone Encounter (Signed)
Called and spoke with pt letting her know that she needed to call PCP to discuss lasix and she verbalized understanding. Nothing further needed.

## 2022-07-25 ENCOUNTER — Ambulatory Visit (HOSPITAL_BASED_OUTPATIENT_CLINIC_OR_DEPARTMENT_OTHER)
Admission: RE | Admit: 2022-07-25 | Discharge: 2022-07-25 | Disposition: A | Discharge status: Home / Self Care | Payer: Medicare PPO | Source: Ambulatory Visit | Attending: Acute Care | Admitting: Acute Care

## 2022-07-25 DIAGNOSIS — R0602 Shortness of breath: Secondary | ICD-10-CM | POA: Insufficient documentation

## 2022-07-27 ENCOUNTER — Ambulatory Visit (HOSPITAL_BASED_OUTPATIENT_CLINIC_OR_DEPARTMENT_OTHER): Payer: Medicare PPO

## 2022-07-28 ENCOUNTER — Ambulatory Visit: Payer: Medicare PPO | Admitting: Internal Medicine

## 2022-07-28 ENCOUNTER — Other Ambulatory Visit: Payer: Self-pay | Admitting: Internal Medicine

## 2022-07-28 ENCOUNTER — Telehealth: Payer: Self-pay | Admitting: Pulmonary Disease

## 2022-07-28 ENCOUNTER — Encounter: Payer: Self-pay | Admitting: Internal Medicine

## 2022-07-28 VITALS — BP 143/66 | HR 80 | Temp 98.4°F | Ht 62.0 in | Wt 233.4 lb

## 2022-07-28 DIAGNOSIS — R058 Other specified cough: Secondary | ICD-10-CM | POA: Diagnosis not present

## 2022-07-28 MED ORDER — PREDNISONE 10 MG PO TABS: ORAL_TABLET | ORAL | 0 refills | Status: DC

## 2022-07-28 MED ORDER — OXYCODONE HCL 5 MG PO TABS: 5.0000 mg | ORAL_TABLET | ORAL | 0 refills | Status: AC | PRN

## 2022-07-28 MED ORDER — PREDNISONE 10 MG PO TABS: ORAL_TABLET | ORAL | 0 refills | Status: AC

## 2022-07-28 NOTE — Progress Notes (Addendum)
Carrie White, female    DOB: June 04, 1941   MRN: [id number]   Brief patient profile:  62 yowf   light smoker quit 1970 with h/o intolerance to lisinopril then fine for years new onset dec 2019 and admitted Jan 2020  admitted with cough/sob ? Covid ? Admitted never more than 50% back to baseline then Jan 2021 and readmitted  self referred to pulmonary clinic 07/28/2022  seen by [facility] June 2023 > back to baseline then got sick again the week end p thxgiving > never better since despite     History of Present Illness  07/28/2022  Pulmonary/ 1st office eval/Chaka Jefferys  Chief Complaint  Patient presents with   Acute Visit    Pt states she is having SOB with exertion since July 09, 2022.   Dyspnea:  50 ft Cough: sporadic  Sleep: not able to lie down so sleeps almost 60 degrees since early Nov 2023  SABA use: neb helps for several hours was not needing it prior not June flare and then not again until Nov 25   No obvious day to day or daytime pattern/variability or assoc excess/ purulent sputum or mucus plugs or hemoptysis or cp or chest tightness, subjective wheeze or overt sinus or hb symptoms.     Also denies any obvious fluctuation of symptoms with weather or environmental changes or other aggravating or alleviating factors except as outlined above   No unusual exposure hx or h/o childhood pna/ asthma or knowledge of premature birth.  Current Allergies, Complete Past Medical History, Past Surgical History, Family History, and Social History were reviewed in [employer] record.  ROS  The following are not active complaints unless bolded Hoarseness, sore throat/globus dysphagia, dental problems, itching, sneezing,  nasal congestion or discharge of excess mucus or purulent secretions, ear ache,   fever, chills, sweats, unintended wt loss or wt gain, classically pleuritic or exertional cp,  orthopnea pnd or arm/hand swelling  or leg swelling x years, no worse than usual   presyncope, palpitations, abdominal pain, anorexia, nausea, vomiting, diarrhea  or change in bowel habits or change in bladder habits, change in stools or change in urine, dysuria, hematuria,  rash, arthralgias, visual complaints, headache, numbness, weakness or ataxia or problems with walking or coordination,  change in mood or  memory.           Past Medical History:  Diagnosis Date   Complication of anesthesia    Depression    Diabetes mellitus    type II- uncontrolled   FIBROMYALGIA 05/07/2007   Fibromyalgia    GERD (gastroesophageal reflux disease)    GOITER, MULTINODULAR 07/01/2010   HYPERLIPIDEMIA 07/31/2008   HYPERTENSION 08/01/2008   INSOMNIA, CHRONIC 06/29/2009   Intraductal papilloma of breast 07/12/2011   Lower respiratory tract infection 09/04/2019   OSTEOARTHRITIS 05/07/2007   PONV (postoperative nausea and vomiting)    RESTLESS LEG SYNDROME 05/07/2007    Outpatient Medications Prior to Visit - - NOTE:   Unable to verify as accurately reflecting what pt takes    Medication Sig Dispense Refill   ACCU-CHEK SOFTCLIX LANCETS lancets Use to test blood sugar 2 times daily as instructed. Dx code: 250.61 100 each 10   acetaminophen-codeine (TYLENOL #3) 300-30 MG tablet Take by mouth.     albuterol (PROVENTIL) (2.5 MG/3ML) 0.083% nebulizer solution Take 3 mLs (2.5 mg total) by nebulization every 6 (six) hours as needed for wheezing or shortness of breath. 75 mL 12   albuterol (VENTOLIN  HFA) 108 (90 Base) MCG/ACT inhaler Inhale 2 puffs into the lungs every 4 (four) hours as needed for wheezing or shortness of breath. 8 g 6   alendronate (FOSAMAX) 70 MG tablet Take 70 mg by mouth once a week.     amLODipine (NORVASC) 5 MG tablet TAKE 1 TABLET BY MOUTH EVERY DAY 90 tablet 3   azithromycin (ZITHROMAX) 250 MG tablet Take 2 today, then 1 daily until gone 6 tablet 0   azithromycin (ZITHROMAX) 250 MG tablet Take 1 tablet (250 mg total) by mouth daily. Take two tablets on day one and then  one daily for four days 6 tablet 0   BD ULTRA-FINE PEN NEEDLES 29G X 12.7MM MISC USE DAILY 100 each 1   benzonatate (TESSALON) 100 MG capsule Take 1 capsule (100 mg total) by mouth 2 (two) times daily as needed for cough. 30 capsule 1   Blood Glucose Monitoring Suppl (ACCU-CHEK AVIVA PLUS) W/DEVICE KIT Use to check blood sugar daily. Dx code: 250.61 1 kit 0   budesonide-formoterol (SYMBICORT) 160-4.5 MCG/ACT inhaler Inhale 2 puffs into the lungs in the morning and at bedtime. 1 each 5   celecoxib (CELEBREX) 200 MG capsule Take 200 mg by mouth 2 (two) times daily.     DULoxetine (CYMBALTA) 30 MG capsule Take 30 mg by mouth daily.     ergocalciferol (VITAMIN D2) 1.25 MG (50000 UT) capsule TAKE 1 CAPSULE BY MOUTH ONE TIME PER WEEK     ferrous sulfate 325 (65 FE) MG tablet Take 325 mg by mouth daily with breakfast.     furosemide (LASIX) 20 MG tablet Take 40 mg by mouth daily.     gabapentin (NEURONTIN) 300 MG capsule Take 300 mg by mouth at bedtime.     glucose blood (ACCU-CHEK AVIVA PLUS) test strip Use to test blood sugar 2 times daily as instructed. Dx code: 250.61 100 each 10   Glucose Blood (BAYER BREEZE 2 TEST) DISK TEST 2 TIMES PER DAY 200 each 3   Insulin Lispro Prot & Lispro (HUMALOG MIX 75/25 KWIKPEN) (75-25) 100 UNIT/ML Kwikpen Inject 50 Units into the skin 2 (two) times daily.     ipratropium-albuterol (DUONEB) 0.5-2.5 (3) MG/3ML SOLN Take 3 mLs by nebulization every 6 (six) hours. (Patient taking differently: Take 3 mLs by nebulization every 6 (six) hours as needed (shortness of breath).) 360 mL 11   omeprazole (PRILOSEC) 40 MG capsule Take 1 capsule (40 mg total) by mouth daily. PT NEEDS NEW PCP FOR FURTHER REFILLS DR [name] RETIRED (Patient taking differently: Take 40 mg by mouth daily.) 30 capsule 5   predniSONE (DELTASONE) 20 MG tablet 20 mg daily for 7 days 7 tablet 0   predniSONE (DELTASONE) 20 MG tablet Take 1 tablet (20 mg total) by mouth daily with breakfast. 10 tablet 0    rOPINIRole (REQUIP) 4 MG tablet Take 4 mg by mouth at bedtime.      traZODone (DESYREL) 100 MG tablet Take 100-200 mg by mouth at bedtime as needed.      omeprazole (PRILOSEC) 40 MG capsule Take 1 capsule (40 mg total) by mouth in the morning and at bedtime for 5 days. 10 capsule 0   montelukast (SINGULAIR) 10 MG tablet Take 10 mg by mouth at bedtime.     No facility-administered medications prior to visit.     Objective:     BP (!) 143/66 (BP Location: Left Arm, Patient Position: Sitting, Cuff Size: Large)   Pulse 80   Temp  98.4 F (36.9 C) (Oral)   Ht _0  (1.575 m)   Wt 233 lb 6.4 oz (105.9 kg)   SpO2 98% Comment: on RA  BMI 42.69 kg/m   SpO2: 98 % (on RA)  Wt Readings from Last 3 Encounters:  2022/08/22 233 lb 6.4 oz (105.9 kg)  07/19/22 225 lb (102.1 kg)  07/12/22 222 lb (100.7 kg)   12/19/19            196      General appearance:         Obese amb wm panting for breath with classic pseudoasthma    HEENT : Oropharynx  clear/ no cobblestoning on pnd     Nasal turbinates snl    NECK :  without  apparent JVD/ palpable Nodes/TM    LUNGS: no acc muscle use,  Nl contour chest which is clear to A and P bilaterally without cough on insp or exp maneuvers   CV:  RRR  no s3 or murmur or increase in P2, and 2+ ankle edema   ABD:  quite obese but soft and nontender    MS:  Nl gait/ ext warm without deformities Or obvious joint restrictions  calf tenderness, cyanosis or clubbing    SKIN: warm and dry without lesions    NEURO:  alert, approp, nl sensorium with  no motor or cerebellar deficits apparent.     I personally reviewed images and agree with radiology impression as follows:   Chest CTw/o contrast  07/25/22  1. No acute chest findings or explanation for the patient's symptoms. 2. The lungs appears stable, without evidence of interstitial lung disease or acute abnormality. 3. Stable right thyroid nodule which has been previously evaluated by ultrasound. 4.  Mild distal esophageal wall thickening, nonspecific. 5. Multilevel thoracic spondylosis with bridging osteophytes. 6.  Aortic Atherosclerosis (ICD10-I70.0).     Assessment   Upper airway cough syndrome with VCD Onset 2018-08-22 / still grieving over  death of husband in September 22, 2012 - PFT's  12/19/19   FEV1 1.98 (106 % ) ratio 0.84  p 14 % improvement from saba p 0 prior to study with DLCO  wnl    and FV curve min concave   CT chest Jan 08, 21133 only significant finding = Mild distal esophageal wall thickening, nonspecific. - classic psuedowheeze on eval  31/49/7026 > cyclical cough rx   Upper airway cough syndrome (previously labeled PNDS),  is so named because it's frequently impossible to sort out how much is  CR/sinusitis with freq throat clearing (which can be related to primary GERD)   vs  causing  secondary (" extra esophageal")  GERD from wide swings in gastric pressure that occur with throat clearing, often  promoting self use of mint and menthol lozenges that reduce the lower esophageal sphincter tone and exacerbate the problem further in a cyclical fashion.   These are the same pts (now being labeled as having "irritable larynx syndrome" by some cough centers) who not infrequently have a history of having failed to tolerate ace inhibitors(as is the case here) ,  dry powder inhalers(or any high dose ics or excess hfa saba)  or biphosphonates(note she's on fosamax relatively new start)  or report having atypical/extraesophageal reflux symptoms that don't respond to standard doses of PPI(supported by CT findings above)  and are easily confused as having aecopd or asthma flares by even experienced allergists/ pulmonologists (myself included).   Of the three most common causes of  Sub-acute /  recurrent or chronic cough, only one (GERD)  can actually contribute to/ trigger  the other two (asthma and post nasal drip syndrome)  and perpetuate the cylce of cough.  While not intuitively obvious, many  patients with chronic low grade reflux do not cough until there is a primary insult that disturbs the protective epithelial barrier and exposes sensitive nerve endings.   This is typically viral but can due to PNDS and  either may apply here.     >>>The point is that once this occurs, it is difficult to eliminate the cycle  using anything but a maximally effective acid suppression regimen at least in the short run, accompanied by an appropriate diet to address non acid GERD and control / eliminate the cough itself for at least 3 days with oxyir and >>> also so added 6 day taper off  Prednisone starting at 40 mg per day in case of component of Th-2 driven upper or lower airways inflammation (if cough responds short term only to relapse before return while will on full rx for uacs (as above), then  that would point to allergic rhinitis/ asthma or eos bronchitis as alternative dx) / hold off all hfa's and replace with neb when coughing and hold fosamax for now as well (? Prolia or Reclast instead?)  Referred to ENT / Carol Ada for the assoc findings of VCD on exam today and f/u pulmonary with Dr Vaughan Browner         Each maintenance medication was reviewed in detail including emphasizing most importantly the difference between maintenance and prns and under what circumstances the prns are to be triggered using an action plan format where appropriate.  Total time for H and P, chart review, counseling, reviewing hfa/ neb device(s) and generating customized AVS unique to this office visit / same day charting > 40 min for pt new to me with multiple  refractory respiratory  symptoms of uncertain etiology         Morbid obesity due to excess calories ([place]) Body mass index is 42.69 kg/m.  -  trending up steadily  since husband died Lab Results  Component Value Date   TSH 0.210 (L) 08/24/2018   Contributing to doe and risk of worsening  GERD >>>   reviewed the need and the process to achieve and maintain  neg calorie balance > defer f/u primary care including intermittently monitoring thyroid status        Christinia Gully, MD 07/28/2022

## 2022-07-28 NOTE — Patient Instructions (Addendum)
Stop fosamax and symbicort and inhalers   For breathing (can't catch your breath) use nebulizer up to every 4 hours   For cough> Delsym  2 tsp every 12 hours and supplement with  hydrocodone 5 mg every 4 hours   Change Prilosec 40 mg  Take 30- 60 min before your first and last meals of the day   GERD (REFLUX)  is an extremely common cause of respiratory symptoms just like yours , many times with no obvious heartburn at all.    It can be treated with medication, but also with lifestyle changes including elevation of the head of your bed (ideally with 6 -8inch blocks under the headboard of your bed),  Smoking cessation, avoidance of late meals, excessive alcohol, and avoid fatty foods, chocolate, peppermint, colas, red wine, and acidic juices such as orange juice.  NO MINT OR MENTHOL PRODUCTS SO NO COUGH DROPS  USE SUGARLESS CANDY INSTEAD (Jolley ranchers or Stover's or Life Savers) or even ice chips will also do - the key is to swallow to prevent all throat clearing. NO OIL BASED VITAMINS - use powdered substitutes.  Avoid fish oil when coughing.   Prednisone 10 mg x 2 with bfast until feel better then 1 daily until better then stop   My office will be contacting you by phone for referral to Dr Carol Ada   - if you don't hear back from my office within one week please call us back or notify us thru MyChart and we'll address it right away.   Follow up with Dr Vaughan Browner

## 2022-07-29 ENCOUNTER — Telehealth: Payer: Self-pay | Admitting: Internal Medicine

## 2022-07-29 NOTE — Assessment & Plan Note (Addendum)
Onset Dec 2019 / still grieving over  death of husband in September 02, 2012 - PFT's  12/19/19   FEV1 1.98 (106 % ) ratio 0.84  p 14 % improvement from saba p 0 prior to study with DLCO  wnl    and FV curve min concave   CT chest 12/21123 only significant finding = Mild distal esophageal wall thickening, nonspecific. - classic psuedowheeze on eval  60/73/7106 > cyclical cough rx   Upper airway cough syndrome (previously labeled PNDS),  is so named because it's frequently impossible to sort out how much is  CR/sinusitis with freq throat clearing (which can be related to primary GERD)   vs  causing  secondary (" extra esophageal")  GERD from wide swings in gastric pressure that occur with throat clearing, often  promoting self use of mint and menthol lozenges that reduce the lower esophageal sphincter tone and exacerbate the problem further in a cyclical fashion.   These are the same pts (now being labeled as having "irritable larynx syndrome" by some cough centers) who not infrequently have a history of having failed to tolerate ace inhibitors(as is the case here) ,  dry powder inhalers(or any high dose ics or excess hfa saba)  or biphosphonates(note she's on fosamax relatively new start)  or report having atypical/extraesophageal reflux symptoms that don't respond to standard doses of PPI(supported by CT findings above)  and are easily confused as having aecopd or asthma flares by even experienced allergists/ pulmonologists (myself included).   Of the three most common causes of  Sub-acute / recurrent or chronic cough, only one (GERD)  can actually contribute to/ trigger  the other two (asthma and post nasal drip syndrome)  and perpetuate the cylce of cough.  While not intuitively obvious, many patients with chronic low grade reflux do not cough until there is a primary insult that disturbs the protective epithelial barrier and exposes sensitive nerve endings.   This is typically viral but can due to PNDS and  either  may apply here.     >>>The point is that once this occurs, it is difficult to eliminate the cycle  using anything but a maximally effective acid suppression regimen at least in the short run, accompanied by an appropriate diet to address non acid GERD and control / eliminate the cough itself for at least 3 days with oxyir and >>> also so added 6 day taper off  Prednisone starting at 40 mg per day in case of component of Th-2 driven upper or lower airways inflammation (if cough responds short term only to relapse before return while will on full rx for uacs (as above), then  that would point to allergic rhinitis/ asthma or eos bronchitis as alternative dx) / hold off all hfa's and replace with neb when coughing and hold fosamax for now as well (? Prolia or Reclast instead?)  Referred to ENT / Carol Ada for the assoc findings of VCD on exam today and f/u pulmonary with Dr Vaughan Browner         Each maintenance medication was reviewed in detail including emphasizing most importantly the difference between maintenance and prns and under what circumstances the prns are to be triggered using an action plan format where appropriate.  Total time for H and P, chart review, counseling, reviewing hfa/ neb device(s) and generating customized AVS unique to this office visit / same day charting > 40 min for pt new to me with multiple  refractory respiratory  symptoms of  uncertain etiology

## 2022-07-29 NOTE — Assessment & Plan Note (Signed)
Body mass index is 42.69 kg/m.  -  trending up steadily  since husband died Lab Results  Component Value Date   TSH 0.210 (L) 08/24/2018   Contributing to doe and risk of worsening  GERD >>>   reviewed the need and the process to achieve and maintain neg calorie balance > defer f/u primary care including intermittently monitoring thyroid status

## 2022-08-01 MED ORDER — OMEPRAZOLE 40 MG PO CPDR: 40.0000 mg | DELAYED_RELEASE_CAPSULE | Freq: Two times a day (BID) | ORAL | 2 refills | Status: DC

## 2022-08-01 NOTE — Telephone Encounter (Signed)
Called patient and went over her needing refills of her medication. She states that the medication is not working and that she is still unable to catch her breath. She just saw Dr Melvyn Novas on 07/28/2022. I asked if she was doing her nebulizer medications and she states yes she is doing 2-3 times a day but she is not getting better.   Please advise   Refills sent.

## 2022-08-01 NOTE — Telephone Encounter (Signed)
The albuterol is for breathing that is not improving on the tylenol # 3  - be sure she's maximizing the latter before giving up on the former which she can use up to every 4 hours and if still can't catch her breath either needs to be seen again asap or go to ER

## 2022-08-02 NOTE — Telephone Encounter (Signed)
Spoke with the pt and notified of response per Dr Melvyn Novas  She verbalized understanding  Nothing further needed

## 2022-08-10 ENCOUNTER — Ambulatory Visit: Payer: Medicare PPO | Admitting: Pulmonary Disease

## 2022-08-10 NOTE — Telephone Encounter (Signed)
Error

## 2022-09-05 ENCOUNTER — Encounter: Payer: Self-pay | Admitting: Pulmonary Disease

## 2022-09-05 ENCOUNTER — Ambulatory Visit: Payer: Medicare PPO | Admitting: Pulmonary Disease

## 2022-09-05 VITALS — BP 128/66 | HR 77 | Temp 98.9°F | Ht 62.0 in | Wt 208.4 lb

## 2022-09-05 DIAGNOSIS — R0683 Snoring: Secondary | ICD-10-CM | POA: Diagnosis not present

## 2022-09-05 DIAGNOSIS — R058 Other specified cough: Secondary | ICD-10-CM

## 2022-09-05 NOTE — Progress Notes (Signed)
Carrie White    [id number]    1941-02-07  Primary Care Physician:Wang, Mikeal Hawthorne, MD  Referring Physician: Rudene Anda, [place] [address],  [place] [id number]  Chief complaint: Follow up for asthma  HPI: 82 y.o. with history of allergies, hypertension, asthma, diabetes Had an admission in January 2020 for acute respiratory failure which is thought to be due to asthma/COPD exacerbation.  Readmitted in January 2021 with respiratory failure, viral bronchitis, SARS-CoV-2 and respiratory virus panel, flu were -negative  Post discharge she continues to have significant congestion, cough, throat irritation.  She is currently on Qvar and duo nebs.  She also has GERD for which she is taking Prilosec once daily  States that breathing and cough have improved in 2021 after lisinopril was held and she was given Prilosec 40 mg twice daily for 2 weeks  Pets: No pets or bird exposure.  Occupation:  Exposures: No mold, hot tub, Jacuzzi.  Has down comforter since 2016 Smoking history: Smoked socially, quit in 1970 Travel history: No significant travel history Relevant family history: No significant family history of lung disease  Interim History: She had a prolonged attack of bronchitis since Thanksgiving of last year treated with Z-Pak, amoxicillin and prednisone x 2.  She has been referred to Dr. Joya Gaskins, ENT at [facility] and is awaiting an appointment Overall cough is slightly better.  No new complaints today.  Outpatient Encounter Medications as of 09/05/2022  Medication Sig   ACCU-CHEK SOFTCLIX LANCETS lancets Use to test blood sugar 2 times daily as instructed. Dx code: 250.61   albuterol (PROVENTIL) (2.5 MG/3ML) 0.083% nebulizer solution Take 3 mLs (2.5 mg total) by nebulization every 6 (six) hours as needed for wheezing or shortness of breath.   albuterol (VENTOLIN HFA) 108 (90 Base) MCG/ACT inhaler Inhale 2 puffs into the lungs every 4 (four) hours as needed for  wheezing or shortness of breath.   amLODipine (NORVASC) 5 MG tablet TAKE 1 TABLET BY MOUTH EVERY DAY   BD ULTRA-FINE PEN NEEDLES 29G X 12.7MM MISC USE DAILY   Blood Glucose Monitoring Suppl (ACCU-CHEK AVIVA PLUS) W/DEVICE KIT Use to check blood sugar daily. Dx code: 250.61   celecoxib (CELEBREX) 200 MG capsule Take 200 mg by mouth 2 (two) times daily.   DULoxetine (CYMBALTA) 30 MG capsule Take 30 mg by mouth daily.   ergocalciferol (VITAMIN D2) 1.25 MG (50000 UT) capsule TAKE 1 CAPSULE BY MOUTH ONE TIME PER WEEK   ferrous sulfate 325 (65 FE) MG tablet Take 325 mg by mouth daily with breakfast.   furosemide (LASIX) 20 MG tablet Take 40 mg by mouth daily.   gabapentin (NEURONTIN) 300 MG capsule Take 300 mg by mouth at bedtime.   glucose blood (ACCU-CHEK AVIVA PLUS) test strip Use to test blood sugar 2 times daily as instructed. Dx code: 250.61   Glucose Blood (BAYER BREEZE 2 TEST) DISK TEST 2 TIMES PER DAY   Insulin Lispro Prot & Lispro (HUMALOG MIX 75/25 KWIKPEN) (75-25) 100 UNIT/ML Kwikpen Inject 50 Units into the skin 2 (two) times daily.   ipratropium-albuterol (DUONEB) 0.5-2.5 (3) MG/3ML SOLN Take 3 mLs by nebulization every 6 (six) hours. (Patient taking differently: Take 3 mLs by nebulization every 6 (six) hours as needed (shortness of breath).)   oxyCODONE (ROXICODONE) 5 MG immediate release tablet Take 1 tablet (5 mg total) by mouth every 4 (four) hours as needed for severe pain.   predniSONE (DELTASONE)  10 MG tablet 10 mg x 2 with breakfast as directed, then 1 daily until symptoms resolve, then stop.   predniSONE (DELTASONE) 10 MG tablet 2 with breakfast until better then 1 x 5 days and stop   rOPINIRole (REQUIP) 4 MG tablet Take 4 mg by mouth at bedtime.    traZODone (DESYREL) 100 MG tablet Take 100-200 mg by mouth at bedtime as needed.    omeprazole (PRILOSEC) 40 MG capsule Take 1 capsule (40 mg total) by mouth in the morning and at bedtime.   No facility-administered encounter  medications on file as of 09/05/2022.   Physical Exam: Blood pressure 128/66, pulse 77, temperature 98.9 F (37.2 C), temperature source Oral, height '5\' 2"'$  (1.575 m), weight 208 lb 6.4 oz (94.5 kg), SpO2 100 %. Gen:      No acute distress HEENT:  EOMI, sclera anicteric Neck:     No masses; no thyromegaly Lungs:    Clear to auscultation bilaterally; normal respiratory effort CV:         Regular rate and rhythm; no murmurs Abd:      + bowel sounds; soft, non-tender; no palpable masses, no distension Ext:    No edema; adequate peripheral perfusion Skin:      Warm and dry; no rash Neuro: alert and oriented x 3 Psych: normal mood and affect   Data Reviewed: Imaging: CTA 08/24/2018-no PE, mild dependent atelectasis.  Coronary atherosclerosis Chest x-ray 09/04/2019-mild cardiomegaly, no active cardiopulmonary disease. High-resolution CT 10/24/2019-no interstitial lung disease, mild patchy air trapping, dominant 1.6 cm posterior thyroid nodule. CT chest 07/26/2022-clear lungs with no acute abnormality,  Right thyroid nodule, esophageal wall thickening I have reviewed the images personally.  PFTs: Spirometry 09/19/2018 FVC 2 [76], FEV1 1.6 [79%], F/F 78 Normal test  Labs: CBC 09/03/2019-WBC 12.7, eos 1%, absolute eosinophil count 127 CBC 10/09/2019-WBC 11.4, eos 0.9%, absolute eosinophil count 103  IgE 09/19/2018-17 IgE 10/08/2018 1-25  BNP 10/08/2018 1-11 Hypersensitivity panel 10/09/2019-negative  Cardiac: Echo 08/25/2018-LVEF 85-02%, grade 1 diastolic dysfunction, RVSP 26, PA systolic pressure within normal range  Assessment:  Respiratory failure, asthma ACE inhibitor induced cough Has a diagnosis of asthma but no obstruction on spirometry in the past Suspect she has a combination of GERD, ACE inhibitor induced cough. History is notable for down exposure for the past 4 years but HRCT and hypersensitivity panel with no evidence of pneumonitis.  Continues to struggle with intermittent  productive cough Awaiting ENT eval at [facility] by Dr. Joya Gaskins Continue nebulizers.  She is off inhalers as it cannot make a difference in the past  Thyroid nodule Incidentally noted on CT chest.  Patient stated that they will follow-up with PCP regarding the  GERD Continue Prilosec  Suspected OSA Untreated OSA may be causing persistent cough Will schedule a home sleep study  Plan/Recommendations: Continue nebulizers, antiacid medication Home sleep study  Marshell Garfinkel MD [facility] 09/05/2022, 3:21 PM  CC: Rudene Anda, MD

## 2022-09-05 NOTE — Patient Instructions (Addendum)
  I am glad that your starting to feel better Continue the nebulizers and antiacid medication Will schedule a home sleep study Follow-up in 6 months.

## 2022-10-17 ENCOUNTER — Telehealth: Payer: Self-pay | Admitting: Internal Medicine

## 2022-10-17 NOTE — Telephone Encounter (Signed)
Spoke to Amy in Dr [employer] office.  She states she does not see where they have received records.  I told her I would refax them and then call her to verify if received.  Referral/records refaxed.

## 2022-10-17 NOTE — Telephone Encounter (Signed)
Pt. Calling to make sure we send notes to the Carrie White's office for the referral Carrie White is sending her to and she a has the [address] on [address] and there [phone]

## 2022-10-17 NOTE — Telephone Encounter (Signed)
I documented in the referral that I faxed records on 12/21 and confirmation was received.  I called Dr Bettina Gavia office this afternoon to confirm they were received but had to leave a vm for someone to call me back.

## 2022-10-17 NOTE — Telephone Encounter (Signed)
Judeen Hammans,  Can you confirm if you faxed patients records along with the referral from back in December?  She is needing all the records faxed to Dr Ileana Roup office  Please let me know

## 2022-10-18 NOTE — Telephone Encounter (Signed)
Called Dr Bettina Gavia office and left vm to verify if records were received.

## 2022-10-19 NOTE — Telephone Encounter (Signed)
Left another vm for Dr Bettina Gavia office to verify if records were received.

## 2022-10-20 NOTE — Telephone Encounter (Signed)
Spoke to Carrie White in Dr [employer] office and verified they do have records now.  I spoke to pt & made her aware.  Nothing further needed.

## 2022-10-28 ENCOUNTER — Other Ambulatory Visit: Payer: Self-pay | Admitting: Internal Medicine

## 2022-11-22 ENCOUNTER — Telehealth: Payer: Self-pay | Admitting: Pulmonary Disease

## 2022-11-22 ENCOUNTER — Ambulatory Visit (INDEPENDENT_AMBULATORY_CARE_PROVIDER_SITE_OTHER): Payer: Medicare PPO

## 2022-11-22 DIAGNOSIS — G4733 Obstructive sleep apnea (adult) (pediatric): Secondary | ICD-10-CM

## 2022-11-22 DIAGNOSIS — R0683 Snoring: Secondary | ICD-10-CM

## 2022-11-22 NOTE — Telephone Encounter (Signed)
Call patient  Sleep study result  Date of study: 11/02/2022  Impression: Moderate obstructive sleep apnea Moderate oxygen desaturations  Recommendation: DME referral  Recommend CPAP therapy for moderate obstructive sleep apnea  Auto titrating CPAP with pressure settings of 5-15 will be appropriate  Encourage weight loss measures  Follow-up in the office 4 to 6 weeks following initiation of treatment

## 2022-11-30 NOTE — Telephone Encounter (Signed)
Spoke with patient regarding her sleep study result's per Dr.Olalere  Date of study: 11/02/2022   Impression: Moderate obstructive sleep apnea Moderate oxygen desaturations   Recommendation: DME referral   Recommend CPAP therapy for moderate obstructive sleep apnea   Auto titrating CPAP with pressure settings of 5-15 will be appropriate   Encourage weight loss measures   Follow-up in the office 4 to 6 weeks following initiation of treatment  Patient stated she wanted to hold off on CPAP at this time she has a lot going on .Advised patient to call our office back to maker a f/u to discuss CPAP.Patient's voice was understanding.Nothing else further needed.

## 2023-01-30 ENCOUNTER — Other Ambulatory Visit: Payer: Self-pay | Admitting: Pulmonary Disease
